# Patient Record
Sex: Male | Born: 1965 | Race: White | Hispanic: No | Smoking: Current every day smoker
Health system: Southern US, Community
[De-identification: ages and names within clinical notes are randomized; demographics above are authoritative.]

## PROBLEM LIST (undated history)

## (undated) DIAGNOSIS — R011 Cardiac murmur, unspecified: Secondary | ICD-10-CM

## (undated) DIAGNOSIS — F17200 Nicotine dependence, unspecified, uncomplicated: Secondary | ICD-10-CM

## (undated) DIAGNOSIS — E785 Hyperlipidemia, unspecified: Secondary | ICD-10-CM

## (undated) DIAGNOSIS — I10 Essential (primary) hypertension: Secondary | ICD-10-CM

## (undated) DIAGNOSIS — R079 Chest pain, unspecified: Secondary | ICD-10-CM

## (undated) DIAGNOSIS — Z973 Presence of spectacles and contact lenses: Secondary | ICD-10-CM

## (undated) DIAGNOSIS — G4730 Sleep apnea, unspecified: Secondary | ICD-10-CM

## (undated) DIAGNOSIS — Z9289 Personal history of other medical treatment: Secondary | ICD-10-CM

## (undated) DIAGNOSIS — E119 Type 2 diabetes mellitus without complications: Secondary | ICD-10-CM

## (undated) DIAGNOSIS — I2510 Atherosclerotic heart disease of native coronary artery without angina pectoris: Secondary | ICD-10-CM

## (undated) DIAGNOSIS — Z86010 Personal history of colonic polyps: Secondary | ICD-10-CM

## (undated) HISTORY — DX: Type 2 diabetes mellitus without complications: E11.9

## (undated) HISTORY — DX: Cardiac murmur, unspecified: R01.1

## (undated) HISTORY — DX: Presence of spectacles and contact lenses: Z97.3

## (undated) HISTORY — DX: Hyperlipidemia, unspecified: E78.5

## (undated) HISTORY — DX: Sleep apnea, unspecified: G47.30

## (undated) HISTORY — DX: Nicotine dependence, unspecified, uncomplicated: F17.200

## (undated) HISTORY — DX: Chest pain, unspecified: R07.9

## (undated) HISTORY — DX: Essential (primary) hypertension: I10

## (undated) HISTORY — DX: Personal history of colonic polyps: Z86.010

## (undated) HISTORY — DX: Personal history of other medical treatment: Z92.89

---
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
## 1983-09-12 HISTORY — PX: KNEE ARTHROSCOPY: SUR90

## 1999-09-12 DIAGNOSIS — R079 Chest pain, unspecified: Secondary | ICD-10-CM

## 1999-09-12 HISTORY — DX: Chest pain, unspecified: R07.9

## 2000-01-10 ENCOUNTER — Ambulatory Visit (HOSPITAL_COMMUNITY): Admission: RE | Admit: 2000-01-10 | Discharge: 2000-01-10 | Payer: Self-pay | Admitting: Family Medicine

## 2000-01-12 ENCOUNTER — Ambulatory Visit (HOSPITAL_COMMUNITY): Admission: RE | Admit: 2000-01-12 | Discharge: 2000-01-12 | Payer: Self-pay | Admitting: Gastroenterology

## 2006-02-26 ENCOUNTER — Ambulatory Visit: Payer: Self-pay | Admitting: Family Medicine

## 2006-12-03 ENCOUNTER — Ambulatory Visit: Payer: Self-pay | Admitting: Family Medicine

## 2008-04-03 ENCOUNTER — Ambulatory Visit: Payer: Self-pay | Admitting: Family Medicine

## 2008-04-09 ENCOUNTER — Ambulatory Visit: Payer: Self-pay | Admitting: Family Medicine

## 2008-06-10 ENCOUNTER — Ambulatory Visit: Payer: Self-pay | Admitting: Family Medicine

## 2010-01-26 ENCOUNTER — Ambulatory Visit: Payer: Self-pay | Admitting: Family Medicine

## 2010-04-12 ENCOUNTER — Ambulatory Visit: Payer: Self-pay | Admitting: Family Medicine

## 2010-09-13 ENCOUNTER — Ambulatory Visit
Admission: RE | Admit: 2010-09-13 | Discharge: 2010-09-13 | Payer: Self-pay | Source: Home / Self Care | Attending: Family Medicine | Admitting: Family Medicine

## 2011-01-27 NOTE — Procedures (Signed)
[facility]  Patient:    Chris Skinner, Chris Skinner                       MRN: [id number] Proc. Date: 01/12/00 Adm. Date:  [id number] Disc. Date: [id number] Attending:  Rich Brave CC:         Ronnald Nian, M.D.                           Procedure Report  INDICATION:  [age]-year-old with one-week history of severe odynophagia symptoms, to the point where he has been unable to eat or take in adequate fluids and actually needed several liters of IV fluids the other day.  He has not been on recent antibiotics, not had any obvious thermal ingestion of caustic ingestion o account for his esophagitis, which was verified radiographically yesterday.  ENDOSCOPIST:  Florencia Reasons, M.D.  FINDINGS:  Severe ulcerative distal esophagitis of indeterminate etiology.  DESCRIPTION OF PROCEDURE:  The nature, purpose and risks or the procedure had been discussed with the patient and his wife and he provided written consent. Sedation was Fentanyl 100 mcg and Versed 10 mg IV, without arrhythmias or desaturation. The Olympus GIF-140 upper endoscope was passed under direct vision without undue difficulty.  The vocal cords and larynx looked entirely normal.  Although the proximal esophageal mucosa was normal (apart from a small nodule consistent with a squamous papilloma), the distal half of the esophagus had severe ulcerative changes, beginning in the upper esophagus, as small discrete ulcerations measuring a few millimeters across to what became essentially circumferential or large patchy ulcers in the more distal section of the esophagus.  All of these ulcerations were fairly superficial.  There was friability present.  There was  2-cm hiatal hernia but no ring or stricture, no evidence of varices or exudate r neoplasia.  The stomach was entered.  It contained no significant residual and ad normal mucosa.  Retroflexed viewing was unsuccessful due to the  patient not being able to keep the air in well, with fairly frequent retching.  The pylorus and duodenal bulb and proximal second duodenum looked normal.  Viral cultures as well as biopsies for H and E were obtained prior to removal of the scope.  The patient tolerated the procedure well and there were no apparent  complications.  IMPRESSION:  Severe distal ulcerative esophagitis.  DISCUSSION:  I wonder about a viral esophagitis, given the multiple discrete lesions and the absence of typical candidal exudate.  PLAN:  Await pathology on biopsies.  Initiate Carafate suspension.  Consider empiric therapy with Acyclovir. DD:  01/12/00 TD:  01/16/00 Job: [id number]

## 2011-02-20 ENCOUNTER — Other Ambulatory Visit: Payer: Self-pay

## 2011-02-20 MED ORDER — ROSUVASTATIN CALCIUM 20 MG PO TABS: 20.0000 mg | ORAL_TABLET | Freq: Every day | ORAL | Status: DC

## 2011-02-28 ENCOUNTER — Encounter: Payer: Self-pay | Admitting: Medical

## 2011-02-28 ENCOUNTER — Ambulatory Visit (INDEPENDENT_AMBULATORY_CARE_PROVIDER_SITE_OTHER): Payer: BC Managed Care – PPO | Admitting: Medical

## 2011-02-28 DIAGNOSIS — Z0000 Encounter for general adult medical examination without abnormal findings: Secondary | ICD-10-CM

## 2011-02-28 DIAGNOSIS — K429 Umbilical hernia without obstruction or gangrene: Secondary | ICD-10-CM

## 2011-02-28 DIAGNOSIS — Z0100 Encounter for examination of eyes and vision without abnormal findings: Secondary | ICD-10-CM

## 2011-02-28 DIAGNOSIS — Z0110 Encounter for examination of ears and hearing without abnormal findings: Secondary | ICD-10-CM

## 2011-02-28 DIAGNOSIS — E785 Hyperlipidemia, unspecified: Secondary | ICD-10-CM

## 2011-02-28 DIAGNOSIS — I10 Essential (primary) hypertension: Secondary | ICD-10-CM

## 2011-02-28 DIAGNOSIS — Z139 Encounter for screening, unspecified: Secondary | ICD-10-CM

## 2011-02-28 DIAGNOSIS — G4730 Sleep apnea, unspecified: Secondary | ICD-10-CM

## 2011-02-28 DIAGNOSIS — K029 Dental caries, unspecified: Secondary | ICD-10-CM

## 2011-02-28 LAB — POCT URINALYSIS DIPSTICK
Ketones, UA: NEGATIVE
Spec Grav, UA: 1.015
Urobilinogen, UA: NEGATIVE
pH, UA: 6

## 2011-02-28 MED ORDER — ROSUVASTATIN CALCIUM 20 MG PO TABS: 20.0000 mg | ORAL_TABLET | Freq: Every day | ORAL | Status: DC

## 2011-02-28 NOTE — Progress Notes (Signed)
Subjective:   HPI  Chris Skinner is a 45 y.o. male who presents for complete physical.   Has has several concerns today.  Has concern for possible umbilical hernia. He notices a bulge around his umbilicus. This has been there for a few months.  Has concern for sleep apnea. His girlfriend notes that he snores a lot, and does stop breathing in his sleep at times. She has seen up to a minute of apnea. Has concern for hiccups when he eats salads.  He denies trouble swallowing in general, food does not get stuck, denies reflux.  Otherwise he tries to eat relatively healthy, exercises somewhat regularly.  Past Medical History  Diagnosis Date  . Dyslipidemia   . Tobacco use disorder    Past Surgical History  Procedure Date  . Knee surgery     left, arthroscopic   No Known Allergies  Reviewed medications, allergies, medical, surgical, family, social history.  Review of Systems Constitutional: denies fever, chills, sweats, unexpected weight change, anorexia, fatigue Allergy: negative; denies recent sneezing, itching, congestion Dermatology: denies changing moles, rash, lumps, new worrisome lesions ENT: no runny nose, ear pain, sore throat, hoarseness, sinus pain, teeth pain, tinnitus, hearing loss, epistaxis Cardiology: denies chest pain, palpitations, edema, orthopnea, paroxysmal nocturnal dyspnea Respiratory: denies cough, shortness of breath, dyspnea on exertion, wheezing, hemoptysis Gastroenterology: denies abdominal pain, nausea, vomiting, diarrhea, constipation, blood in stool, changes in bowel movement, dysphagia Hematology: denies bleeding or bruising problems Musculoskeletal: denies arthralgias, myalgias, joint swelling, back pain, neck pain, cramping, gait changes Ophthalmology: denies vision changes, eye redness, itching, discharge Urology: denies dysuria, difficulty urinating, hematuria, urinary frequency, urgency, incontinence Neurology: no headache, weakness, tingling, numbness,  speech abnormality, memory loss, falls, dizziness Psychology: denies depressed mood, agitation, sleep problems     Objective:   Physical Exam  General appearance: alert, no distress, WD/WN, white male Skin: Scattered flat macules of various sizes on torso anterior and posterior, legs and arms, most are benign appearing, but one particular lesion of left chest is 2 mm x 3 mm, mostly brown with some light brown clearing, border is well-defined, somewhat diamond-shaped lesion. HEENT: normocephalic, conjunctiva/corneas normal, sclerae anicteric, PERRLA, EOMi, nares patent, no discharge or erythema, pharynx normal Oral cavity: MMM, tongue normal, several teeth with caries, one left upper molar is fractured  Neck: supple, no lymphadenopathy, no thyromegaly, no masses, normal ROM, no JVD, no bruit Chest: non tender, normal shape and expansion Heart: RRR, normal S1, S2, no murmurs Lungs: CTA bilaterally, no wheezes, rhonchi, or rales Abdomen: +bs, 1.5 cm reducible umbilicus hernia, soft, non tender, non distended, no masses, no hepatomegaly, no splenomegaly, no bruits Back: non tender, normal ROM, no scoliosis Musculoskeletal: upper extremities non tender, no obvious deformity, normal ROM throughout, lower extremities non tender, no obvious deformity, normal ROM throughout Extremities: no edema, no cyanosis, no clubbing Pulses: 2+ symmetric, upper and lower extremities, normal cap refill Neurological: alert, oriented x 3, CN2-12 intact, strength normal upper extremities and lower extremities, sensation normal throughout, DTRs 2+ throughout, no cerebellar signs, gait normal Psychiatric: normal affect, behavior normal, pleasant  GU: Nontender, no masses, no lymphadenopathy, no lesions, no hernia Rectal: anus normal appearing, no obvious hernia, prostate WNL, occult negative stool    Assessment :    Encounter Diagnoses  Name Primary?  . General medical examination Yes  . Screening for unspecified  condition   . Examination of eyes and vision   . Examination of ears and hearing   . Unspecified essential hypertension   .  Hyperlipidemia   . Sleep apnea   . Umbilical hernia   . Dental caries      Plan:    Discussed healthy lifestyle, diet, exercise, skin surveillance, preventative care. We will get titers for proof of immunization per the employment form. PPD placed today, and he'll return in 48 hours for recheck. He is up-to-date on tetanus booster. He will get hep B series through work.  We will recheck your moles in 6 months.  Otherwise, consider dermatology referral for surveillance.  We will call with lab results in 3-4 days.  We will use a watch and wait approach on the hiccups and umbilical hernia.  For now, chew food thoroughly, drink plenty of water.   Advise he stop smoking, discussed risk of tobacco use.  Hypertension-new diagnosis today. Looking back, his blood pressure has been elevated on multiple occasions.  He also has symptoms suggestive of sleep apnea. We discussed getting a sleep study which he wants to proceed with, however he will call back regarding appointment time. We will hold off on medication until we get sleep study results.  Advised he follow up with dentist regarding dental caries.  Follow up with your eye doctor for routine screening.

## 2011-02-28 NOTE — Patient Instructions (Signed)
Your blood pressure is elevated.  Call us back and let us know if you want the overnight at home study versus the official sleep study.  We will hold off on medication until we get sleep study results.  Follow up with your dentist regarding dental caries.  Follow up with your eye doctor for routine screening.  Return in 48-72 hours for PPD skin test reading.  Lets recheck your moles in 6 months.  We will call with lab results in 3-4 days.    We will use a watch and wait approach on the hiccups and umbilical hernia.  For now, chew food thoroughly, drink plenty of water.   Preventative Care for Adults, Male       REGULAR HEALTH EXAMS:  A routine yearly physical is a good way to check in with your primary care provider about your health and preventive screening. It is also an opportunity to share updates about your health and any concerns you have, and receive a thorough all-over exam.   Most health insurance companies pay for at least some preventative services.  Check with your health plan for specific coverage.  WHAT PREVENTATIVE SERVICES DO MEN NEED?  Adult men should have their weight and blood pressure checked regularly.   Men age 89 and older should have their cholesterol levels checked regularly.  Beginning at age 26 and continuing to age 71, men should be screened for colorectal cancer.  Certain people should may need continued testing until age 28.  Other cancer screening may include exams for testicular and prostate cancer.  Updating vaccinations is part of preventative care.  Vaccinations help protect against diseases such as the flu.  Lab tests are generally done as part of preventative care to screen for anemia and blood disorders, to screen for problems with the kidneys and liver, to screen for bladder problems, to check blood sugar, and to check your cholesterol level.  Preventative services generally include counseling about diet, exercise, avoiding tobacco, drugs,  excessive alcohol consumption, and sexually transmitted infections.    GENERAL RECOMMENDATIONS FOR GOOD HEALTH:  Healthy diet:  Eat a variety of foods, including fruit, vegetables, animal or vegetable protein, such as meat, fish, chicken, and eggs, or beans, lentils, tofu, and grains, such as rice.  Drink plenty of water daily.  Decrease saturated fat in the diet, avoid lots of red meat, processed foods, sweets, fast foods, and fried foods.  Exercise:  Aerobic exercise helps maintain good heart health. At least 30-40 minutes of moderate-intensity exercise is recommended. For example, a brisk walk that increases your heart rate and breathing. This should be done on most days of the week.   Find a type of exercise or a variety of exercises that you enjoy so that it becomes a part of your daily life.  Examples are running, walking, swimming, water aerobics, and biking.  For motivation and support, explore group exercise such as aerobic class, spin class, Zumba, Yoga,or  martial arts, etc.    Set exercise goals for yourself, such as a certain weight goal, walk or run in a race such as a 5k walk/run.  Speak to your primary care provider about exercise goals.  Disease prevention:  If you smoke or chew tobacco, find out from your caregiver how to quit. It can literally save your life, no matter how long you have been a tobacco user. If you do not use tobacco, never begin.   Maintain a healthy diet and normal weight. Increased weight leads  to problems with blood pressure and diabetes.   The Body Mass Index or BMI is a way of measuring how much of your body is fat. Having a BMI above 27 increases the risk of heart disease, diabetes, hypertension, stroke and other problems related to obesity. Your caregiver can help determine your BMI and based on it develop an exercise and dietary program to help you achieve or maintain this important measurement at a healthful level.  High blood pressure causes  heart and blood vessel problems.  Persistent high blood pressure should be treated with medicine if weight loss and exercise do not work.   Fat and cholesterol leaves deposits in your arteries that can block them. This causes heart disease and vessel disease elsewhere in your body.  If your cholesterol is found to be high, or if you have heart disease or certain other medical conditions, then you may need to have your cholesterol monitored frequently and be treated with medication.   Ask if you should have a stress test if your history suggests this. A stress test is a test done on a treadmill that looks for heart disease. This test can find disease prior to there being a problem.  Avoid drinking alcohol in excess (more than two drinks per day).  Avoid use of street drugs. Do not share needles with anyone. Ask for professional help if you need assistance or instructions on stopping the use of alcohol, cigarettes, and/or drugs.  Brush your teeth twice a day with fluoride toothpaste, and floss once a day. Good oral hygiene prevents tooth decay and gum disease. The problems can be painful, unattractive, and can cause other health problems. Visit your dentist for a routine oral and dental check up and preventive care every 6-12 months.   Look at your skin regularly.  Use a mirror to look at your back. Notify your caregivers of changes in moles, especially if there are changes in shapes, colors, a size larger than a pencil eraser, an irregular border, or development of new moles.  Safety:  Use seatbelts 100% of the time, whether driving or as a passenger.  Use safety devices such as hearing protection if you work in environments with loud noise or significant background noise.  Use safety glasses when doing any work that could send debris in to the eyes.  Use a helmet if you ride a bike or motorcycle.  Use appropriate safety gear for contact sports.  Talk to your caregiver about gun safety.  Use sunscreen  with a SPF (or skin protection factor) of 15 or greater.  Lighter skinned people are at a greater risk of skin cancer. Don't forget to also wear sunglasses in order to protect your eyes from too much damaging sunlight. Damaging sunlight can accelerate cataract formation.   Practice safe sex. Use condoms. Condoms are used for birth control and to help reduce the spread of sexually transmitted infections (or STIs).  Some of the STIs are gonorrhea (the clap), chlamydia, syphilis, trichomonas, herpes, HPV (human papilloma virus) and HIV (human immunodeficiency virus) which causes AIDS. The herpes, HIV and HPV are viral illnesses that have no cure. These can result in disability, cancer and death.   Keep carbon monoxide and smoke detectors in your home functioning at all times. Change the batteries every 6 months or use a model that plugs into the wall.   Vaccinations:  Stay up to date with your tetanus shots and other required immunizations. You should have a booster for tetanus  every 10 years. Be sure to get your flu shot every year, since 5%-20% of the [place] population comes down with the flu. The flu vaccine changes each year, so being vaccinated once is not enough. Get your shot in the fall, before the flu season peaks.   Other vaccines to consider:  Pneumococcal vaccine to protect against certain types of pneumonia.  This is normally recommended for adults age 72 or older.  However, adults younger than 47 years old with certain underlying conditions such as diabetes, heart or lung disease should also receive the vaccine.  Shingles vaccine to protect against Varicella Zoster if you are older than age 52, or younger than 47 years old with certain underlying illness.  Hepatitis A vaccine to protect against a form of infection of the liver by a virus acquired from food.  Hepatitis B vaccine to protect against a form of infection of the liver by a virus acquired from blood or body fluids, particularly if  you work in health care.  If you plan to travel internationally, check with your local health department for specific vaccination recommendations.  Cancer Screening:  Most routine colon cancer screening begins at the age of 6. On a yearly basis, doctors may provide special easy to use take-home tests to check for hidden blood in the stool. Sigmoidoscopy or colonoscopy can detect the earliest forms of colon cancer and is life saving. These tests use a small camera at the end of a tube to directly examine the colon. Speak to your caregiver about this at age 65, when routine screening begins (and is repeated every 5 years unless early forms of pre-cancerous polyps or small growths are found).   At the age of 44 men usually start screening for prostate cancer every year. Screening may begin at a younger age for those with higher risk. Those at higher risk include African-Americans or having a family history of prostate cancer. There are two types of tests for prostate cancer:   Prostate-specific antigen (PSA) testing. Recent studies raise questions about prostate cancer using PSA and you should discuss this with your caregiver.   Digital rectal exam (in which your doctor's lubricated and gloved finger feels for enlargement of the prostate through the anus).   Screening for testicular cancer.  Do a monthly exam of your testicles. Gently roll each testicle between your thumb and fingers, feeling for any abnormal lumps. The best time to do this is after a hot shower or bath when the tissues are looser. Notify your caregivers of any lumps, tenderness or changes in size or shape immediately.

## 2011-03-01 LAB — CBC WITH DIFFERENTIAL/PLATELET
Hemoglobin: 15.1 g/dL (ref 13.0–17.0)
Lymphocytes Relative: 26 % (ref 12–46)
Lymphs Abs: 2.6 10*3/uL (ref 0.7–4.0)
MCH: 32 pg (ref 26.0–34.0)
Monocytes Relative: 8 % (ref 3–12)
Neutro Abs: 6.3 10*3/uL (ref 1.7–7.7)
Neutrophils Relative %: 62 % (ref 43–77)
Platelets: 309 10*3/uL (ref 150–400)
RBC: 4.72 MIL/uL (ref 4.22–5.81)
WBC: 10.2 10*3/uL (ref 4.0–10.5)

## 2011-03-01 LAB — LIPID PANEL
Cholesterol: 207 mg/dL — ABNORMAL HIGH (ref 0–200)
HDL: 48 mg/dL (ref >39)
Total CHOL/HDL Ratio: 4.3 Ratio
Triglycerides: 115 mg/dL (ref <150)
VLDL: 23 mg/dL (ref 0–40)

## 2011-03-01 LAB — COMPREHENSIVE METABOLIC PANEL
ALT: 36 U/L (ref 0–53)
AST: 25 U/L (ref 0–37)
Chloride: 101 mEq/L (ref 96–112)
Creat: 0.85 mg/dL (ref 0.50–1.35)
Sodium: 135 mEq/L (ref 135–145)
Total Bilirubin: 0.8 mg/dL (ref 0.3–1.2)
Total Protein: 6.7 g/dL (ref 6.0–8.3)

## 2011-03-06 NOTE — Progress Notes (Signed)
Tried calling pt regarding lab results, cannot leave message on v/m to full and work number disconnected.  Will try again later.  CM, LPN

## 2011-03-07 ENCOUNTER — Telehealth: Payer: Self-pay | Admitting: *Deleted

## 2011-03-07 LAB — TB SKIN TEST: TB Skin Test: NEGATIVE mm

## 2011-03-07 NOTE — Telephone Encounter (Signed)
i don't see documentation or office visit note about negative PPD?  Pls help me find this.  I still have his form that will be completed once he gets the MMR.

## 2011-03-07 NOTE — Telephone Encounter (Addendum)
Message copied by Dorthula Perfect on Tue Mar 07, 2011  3:47 PM ------      Message from: Aleen Campi, Kermit Balo      Created: Fri Mar 03, 2011  6:11 PM       Call pt about the results:  He is not immune to one of the MMR components, thus, have him return for the MMR booster.  Did he come in for PPD skin reading?  His thyroid labs were normal.  See rest of msg below.   Called pt with results.  Pt will come in for MMR Booster on 03-09-11 at 2 pm.  Pt did come in for PPD reading which was negative.  CM LPN

## 2011-03-09 ENCOUNTER — Other Ambulatory Visit: Payer: BC Managed Care – PPO

## 2011-03-10 ENCOUNTER — Other Ambulatory Visit (INDEPENDENT_AMBULATORY_CARE_PROVIDER_SITE_OTHER): Payer: BC Managed Care – PPO

## 2011-03-10 DIAGNOSIS — Z23 Encounter for immunization: Secondary | ICD-10-CM

## 2011-03-13 ENCOUNTER — Encounter: Payer: Self-pay | Admitting: Family Medicine

## 2011-03-13 ENCOUNTER — Encounter: Payer: Self-pay | Admitting: Medical

## 2011-03-13 NOTE — Progress Notes (Signed)
  Subjective:    Patient ID: Chris Skinner, male    DOB: Oct 06, 1965, 45 y.o.   MRN: [id number]  HPI    Review of Systems     Objective:   Physical Exam        Assessment & Plan:

## 2011-03-20 ENCOUNTER — Other Ambulatory Visit: Payer: Self-pay | Admitting: Family Medicine

## 2011-07-17 ENCOUNTER — Telehealth: Payer: Self-pay | Admitting: Family Medicine

## 2011-07-19 NOTE — Telephone Encounter (Signed)
JCL HANDLED-LM

## 2011-09-09 ENCOUNTER — Other Ambulatory Visit: Payer: Self-pay | Admitting: Family Medicine

## 2012-02-26 ENCOUNTER — Other Ambulatory Visit: Payer: Self-pay | Admitting: Family Medicine

## 2012-03-15 ENCOUNTER — Other Ambulatory Visit: Payer: Self-pay | Admitting: Family Medicine

## 2012-03-15 NOTE — Telephone Encounter (Signed)
PATIENT NEEDS TO SCHEDULE AN OFFICE VISIT BEFORE HIS MEDICATION RUNS OUT 

## 2012-03-25 ENCOUNTER — Encounter: Payer: BC Managed Care – PPO | Admitting: Medical

## 2012-04-15 ENCOUNTER — Other Ambulatory Visit: Payer: Self-pay | Admitting: Family Medicine

## 2012-04-16 ENCOUNTER — Encounter: Payer: Self-pay | Admitting: Medical

## 2012-04-16 ENCOUNTER — Ambulatory Visit (INDEPENDENT_AMBULATORY_CARE_PROVIDER_SITE_OTHER): Payer: BC Managed Care – PPO | Admitting: Medical

## 2012-04-16 VITALS — BP 120/90 | HR 60 | Temp 98.1°F | Resp 16 | Ht 73.0 in | Wt 226.0 lb

## 2012-04-16 DIAGNOSIS — K429 Umbilical hernia without obstruction or gangrene: Secondary | ICD-10-CM

## 2012-04-16 DIAGNOSIS — Z23 Encounter for immunization: Secondary | ICD-10-CM

## 2012-04-16 DIAGNOSIS — D229 Melanocytic nevi, unspecified: Secondary | ICD-10-CM

## 2012-04-16 DIAGNOSIS — Z720 Tobacco use: Secondary | ICD-10-CM

## 2012-04-16 DIAGNOSIS — Z0000 Encounter for general adult medical examination without abnormal findings: Secondary | ICD-10-CM

## 2012-04-16 DIAGNOSIS — D239 Other benign neoplasm of skin, unspecified: Secondary | ICD-10-CM

## 2012-04-16 DIAGNOSIS — I10 Essential (primary) hypertension: Secondary | ICD-10-CM

## 2012-04-16 DIAGNOSIS — E785 Hyperlipidemia, unspecified: Secondary | ICD-10-CM

## 2012-04-16 DIAGNOSIS — F17200 Nicotine dependence, unspecified, uncomplicated: Secondary | ICD-10-CM

## 2012-04-16 LAB — CBC
HCT: 41.7 % (ref 39.0–52.0)
Hemoglobin: 15 g/dL (ref 13.0–17.0)
MCH: 31.6 pg (ref 26.0–34.0)
MCV: 87.8 fL (ref 78.0–100.0)
RBC: 4.75 MIL/uL (ref 4.22–5.81)

## 2012-04-16 LAB — COMPREHENSIVE METABOLIC PANEL
Alkaline Phosphatase: 41 U/L (ref 39–117)
BUN: 13 mg/dL (ref 6–23)
CO2: 23 mEq/L (ref 19–32)
Creat: 0.69 mg/dL (ref 0.50–1.35)
Glucose, Bld: 79 mg/dL (ref 70–99)
Sodium: 139 mEq/L (ref 135–145)
Total Bilirubin: 0.8 mg/dL (ref 0.3–1.2)
Total Protein: 6.7 g/dL (ref 6.0–8.3)

## 2012-04-16 LAB — LIPID PANEL
HDL: 44 mg/dL (ref >39)
LDL Cholesterol: 138 mg/dL — ABNORMAL HIGH (ref 0–99)
Total CHOL/HDL Ratio: 4.6 Ratio
VLDL: 21 mg/dL (ref 0–40)

## 2012-04-16 MED ORDER — ROSUVASTATIN CALCIUM 20 MG PO TABS: 20.0000 mg | ORAL_TABLET | Freq: Every day | ORAL | Status: DC

## 2012-04-16 MED ORDER — LISINOPRIL 5 MG PO TABS: 5.0000 mg | ORAL_TABLET | Freq: Every day | ORAL | Status: DC

## 2012-04-16 NOTE — Patient Instructions (Signed)
Hypertension - begin Lisinopril 5mg  daily for blood pressure.  Hyperlipidemia - continue Crestor.  Refills sent today.  Begin a daily 81mg  baby aspirin for heart health.  Take a daily multivitamin.  Continue routine exercise and healthy low fat low cholesterol diet.   We updated your hepatitis B vaccine today.  Recheck in 2 months regarding Hepatitis B #2 and recheck on hypertension and new BP medication.  We will refer you to dermatology for skin surveillance.    See your eye doctor yearly, dentist every 6 months.    Try to quit tobacco completely.   Consider umbilical hernia repair in the spring.   YOU CAN QUIT SMOKING!  Talk to your medical provider about using medicines to help you quit. These include nicotine replacement gum, lozenges, or skin patches.  Consider calling 1-800-QUIT-NOW, a toll free 24/7 hotline with free counseling to help you quit.  If you are ready to quit smoking or are thinking about it, congratulations! You have chosen to help yourself be healthier and live longer! There are lots of different ways to quit smoking. Nicotine gum, nicotine patches, a nicotine inhaler, or nicotine nasal spray can help with physical craving. Hypnosis, support groups, and medicines help break the habit of smoking. TIPS TO GET OFF AND STAY OFF CIGARETTES  Learn to predict your moods. Do not let a bad situation be your excuse to have a cigarette. Some situations in your life might tempt you to have a cigarette.   Ask friends and co-workers not to smoke around you.   Make your home smoke-free.   Never have "just one" cigarette. It leads to wanting another and another. Remind yourself of your decision to quit.   On a card, make a list of your reasons for not smoking. Read it at least the same number of times a day as you have a cigarette. Tell yourself everyday, "I do not want to smoke. I choose not to smoke."   Ask someone at home or work to help you with your plan to quit smoking.     Have something planned after you eat or have a cup of coffee. Take a walk or get other exercise to perk you up. This will help to keep you from overeating.   Try a relaxation exercise to calm you down and decrease your stress. Remember, you may be tense and nervous the first two weeks after you quit. This will pass.   Find new activities to keep your hands busy. Play with a pen, coin, or rubber band. Doodle or draw things on paper.   Brush your teeth right after eating. This will help cut down the craving for the taste of tobacco after meals. You can try mouthwash too.   Try gum, breath mints, or diet candy to keep something in your mouth.  IF YOU SMOKE AND WANT TO QUIT:  Do not stock up on cigarettes. Never buy a carton. Wait until one pack is finished before you buy another.   Never carry cigarettes with you at work or at home.   Keep cigarettes as far away from you as possible. Leave them with someone else.   Never carry matches or a lighter with you.   Ask yourself, "Do I need this cigarette or is this just a reflex?"   Bet with someone that you can quit. Put cigarette money in a piggy bank every morning. If you smoke, you give up the money. If you do not smoke, by the end  of the week, you keep the money.   Keep trying. It takes 21 days to change a habit!  Document Released: 06/24/2009 Document Revised: 05/10/2011 Document Reviewed: 06/24/2009 [facility] Patient Information 2012 [place], [place].

## 2012-04-16 NOTE — Progress Notes (Signed)
Subjective:   HPI  Chris Skinner is a 46 y.o. male who presents for complete physical.   Last visit a little over a year ago.  Has been doing well in general.  Has cut down even more on tobacco, is exercising more.  He is compliant with his cholesterol medication.  He never called back last year regarding a sleep study.  He denies any recent issues snoring or apnea events as reported last year.  He wants me to recheck moles and belly button hernia today.  No other new c/o.  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     No Known Allergies  Current Outpatient Prescriptions on File Prior to Visit  Medication Sig Dispense Refill  . CRESTOR 20 MG tablet TAKE 1 TABLET (20 MG TOTAL) BY MOUTH AT BEDTIME.  30 tablet  0  . rosuvastatin (CRESTOR) 20 MG tablet Take 1 tablet (20 mg total) by mouth at bedtime.  30 tablet  11  . DISCONTD: CRESTOR 20 MG tablet TAKE 1 TABLET (20 MG TOTAL) BY MOUTH AT BEDTIME.  30 tablet  5    Past Medical History  Diagnosis Date  . Dyslipidemia   . Tobacco use disorder   . Chest pain 2001    [facility], normal cardiac workup    Past Surgical History  Procedure Date  . Knee surgery     left, arthroscopic    Family History  Problem Relation Age of Onset  . Hyperlipidemia Mother   . Heart disease Mother   . Arthritis Mother   . Stroke Mother   . Heart disease Father   . Diabetes Father     borderline  . Crohn's disease Sister   . Diabetes Paternal Grandfather   . Cancer Neg Hx     History   Social History  . Marital Status: Married    Spouse Name: N/A    Number of Children: N/A  . Years of Education: N/A   Occupational History  . high school PE teacher     [employer]   Social History Main Topics  . Smoking status: Current Everyday Smoker -- 0.5 packs/day for 25 years    Types: Cigarettes  . Smokeless tobacco: Never Used  . Alcohol Use: 6.0 oz/week    12 drink(s) per week  . Drug Use: No  . 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 Not on file   Social History Narrative   Exercise - calisthenics, walking in afternoons 3-5 days per week, single, 2 children    Reviewed their medical, surgical, family, social, medication, and allergy history and updated chart as appropriate.  Review of Systems Constitutional: -fever, -chills, -sweats, -unexpected weight change, -anorexia, -fatigue Allergy: -sneezing, -itching, -congestion Dermatology: + changing moles, rash, lumps, new worrisome lesions ENT: -runny nose, -ear pain, -sore throat, -hoarseness, -sinus pain, -teeth pain, -tinnitus, -hearing loss, -epistaxis Cardiology:  -chest pain, -palpitations, -edema, -orthopnea, -paroxysmal nocturnal dyspnea Respiratory: -cough, -shortness of breath, -dyspnea on exertion, -wheezing, -hemoptysis Gastroenterology: -abdominal pain, -nausea, -vomiting, -diarrhea, -constipation, -blood in stool, -changes in bowel movement, -dysphagia Hematology: -bleeding or bruising problems Musculoskeletal: -arthralgias, -myalgias, -joint swelling, -back pain, -neck pain, -cramping, -gait changes Ophthalmology: -vision changes, -eye redness, -itching, -discharge Urology: -dysuria, -difficulty urinating, -hematuria, -urinary frequency, -urgency, incontinence Neurology: -headache, -weakness, -tingling, -numbness, -speech abnormality, -  memory loss, -falls, -dizziness Psychology:  -depressed mood, -agitation, -sleep problems    Objective:   Physical Exam  General appearance: alert, no distress, WD/WN, white male Skin: Scattered flat macules of various sizes on torso anterior and posterior, legs and arms, most are benign appearing, but a few multicolored, left chest/breast 7 o'clock, left and right lower abdomen HEENT: normocephalic, conjunctiva/corneas normal, sclerae anicteric, PERRLA, EOMi, nares patent, no discharge  or erythema, pharynx normal Oral cavity: MMM, tongue normal, teeth in good repair Neck: supple, no lymphadenopathy, no thyromegaly, no masses, normal ROM, no JVD, no bruit Chest: non tender, normal shape and expansion Heart: RRR, very faint possible I/VI brief systolic murmur heard in left lower sternal border, mitral region, otherwise normal S1, S2 Lungs: CTA bilaterally, no wheezes, rhonchi, or rales Abdomen: +bs, 1.5 cm reducible umbilicus hernia, soft, non tender, non distended, no masses, no hepatomegaly, no splenomegaly, no bruits Back: non tender, normal ROM, no scoliosis Musculoskeletal: upper extremities non tender, no obvious deformity, normal ROM throughout, lower extremities non tender, no obvious deformity, normal ROM throughout Extremities: no edema, no cyanosis, no clubbing Pulses: 2+ symmetric, upper and lower extremities, normal cap refill Neurological: alert, oriented x 3, CN2-12 intact, strength normal upper extremities and lower extremities, sensation normal throughout, DTRs 2+ throughout, no cerebellar signs, gait normal Psychiatric: normal affect, behavior normal, pleasant  GU: Nontender, no masses, no lymphadenopathy, no lesions, no hernia Rectal: deferred   Assessment :    Encounter Diagnoses  Name Primary?  . Routine general medical examination at a health care facility Yes  . Hyperlipidemia   . Essential hypertension, benign   . Atypical mole   . Tobacco use   . Umbilical hernia   . Need for hepatitis B vaccination      Plan:    Physical - Discussed healthy lifestyle, diet, exercise, skin surveillance, preventative care. Completed his school physical form for employment.   Hyperlipidemia - c/t crestor, labs today  Hypertension - begin Lisinopril 5mg  daily, c/t regular exercise, healthy diet, recheck 60mo  Atypical moles - dermatology referral for surveillance  Advise he stop smoking, discussed risk of tobacco use.  Umbilical hernia - no worse than a  year ago.  Consider repair in the spring after football season  Hep B vaccine begun today.  Return in 60mo for Hep B #2.  Vaccine counseling and VIS given today.

## 2012-04-16 NOTE — Addendum Note (Signed)
Addended by: Janeice Robinson on: 04/16/2012 03:35 PM   Modules accepted: Orders

## 2012-04-17 ENCOUNTER — Telehealth: Payer: Self-pay | Admitting: Family Medicine

## 2012-04-17 NOTE — Telephone Encounter (Signed)
I documented the vaccine. CLS

## 2012-04-17 NOTE — Telephone Encounter (Signed)
Message copied by Janeice Robinson on Wed Apr 17, 2012 11:23 AM ------      Message from: Aleen Campi, DAVID S      Created: Wed Apr 17, 2012  9:42 AM       Document the Hep B vaccine

## 2012-04-23 ENCOUNTER — Telehealth: Payer: Self-pay | Admitting: Family Medicine

## 2012-04-23 NOTE — Telephone Encounter (Signed)
PATIENT IS AWARE OF HIS APPOINTMENT WITH [facility] ON 05/16/12 @ 1015 AM WITH DEANE ANDERSON PA-C.CLS FAX OVER OV NOTES TO THE OFFICE THROUGH [facility]. CLS  [facility] [id number]

## 2012-09-12 ENCOUNTER — Other Ambulatory Visit: Payer: Self-pay | Admitting: Medical

## 2012-10-05 ENCOUNTER — Other Ambulatory Visit: Payer: Self-pay | Admitting: Family Medicine

## 2013-03-27 ENCOUNTER — Other Ambulatory Visit: Payer: Self-pay | Admitting: Medical

## 2013-05-24 ENCOUNTER — Other Ambulatory Visit: Payer: Self-pay | Admitting: Family Medicine

## 2013-05-27 ENCOUNTER — Telehealth: Payer: Self-pay | Admitting: Family Medicine

## 2013-05-27 MED ORDER — ROSUVASTATIN CALCIUM 20 MG PO TABS: 20.0000 mg | ORAL_TABLET | Freq: Every day | ORAL | Status: DC

## 2013-05-27 NOTE — Telephone Encounter (Signed)
done

## 2013-06-09 ENCOUNTER — Other Ambulatory Visit: Payer: Self-pay | Admitting: Medical

## 2013-09-09 ENCOUNTER — Ambulatory Visit (INDEPENDENT_AMBULATORY_CARE_PROVIDER_SITE_OTHER): Payer: BC Managed Care – PPO | Admitting: Medical

## 2013-09-09 ENCOUNTER — Encounter: Payer: Self-pay | Admitting: Medical

## 2013-09-09 VITALS — BP 150/90 | HR 68 | Temp 97.6°F | Ht 73.0 in | Wt 222.0 lb

## 2013-09-09 DIAGNOSIS — Z1211 Encounter for screening for malignant neoplasm of colon: Secondary | ICD-10-CM

## 2013-09-09 DIAGNOSIS — R011 Cardiac murmur, unspecified: Secondary | ICD-10-CM

## 2013-09-09 DIAGNOSIS — Z23 Encounter for immunization: Secondary | ICD-10-CM

## 2013-09-09 DIAGNOSIS — Z125 Encounter for screening for malignant neoplasm of prostate: Secondary | ICD-10-CM

## 2013-09-09 DIAGNOSIS — E785 Hyperlipidemia, unspecified: Secondary | ICD-10-CM

## 2013-09-09 DIAGNOSIS — Z0000 Encounter for general adult medical examination without abnormal findings: Secondary | ICD-10-CM

## 2013-09-09 DIAGNOSIS — F17200 Nicotine dependence, unspecified, uncomplicated: Secondary | ICD-10-CM

## 2013-09-09 DIAGNOSIS — I10 Essential (primary) hypertension: Secondary | ICD-10-CM

## 2013-09-09 LAB — COMPREHENSIVE METABOLIC PANEL
ALT: 41 U/L (ref 0–53)
AST: 27 U/L (ref 0–37)
Albumin: 4.5 g/dL (ref 3.5–5.2)
CO2: 26 mEq/L (ref 19–32)
Calcium: 9.2 mg/dL (ref 8.4–10.5)
Chloride: 103 mEq/L (ref 96–112)
Potassium: 4.4 mEq/L (ref 3.5–5.3)
Sodium: 137 mEq/L (ref 135–145)
Total Bilirubin: 0.9 mg/dL (ref 0.3–1.2)
Total Protein: 6.8 g/dL (ref 6.0–8.3)

## 2013-09-09 LAB — CBC
MCHC: 35.9 g/dL (ref 30.0–36.0)
Platelets: 308 10*3/uL (ref 150–400)
RBC: 5.18 MIL/uL (ref 4.22–5.81)
RDW: 13.4 % (ref 11.5–15.5)
WBC: 9.9 10*3/uL (ref 4.0–10.5)

## 2013-09-09 MED ORDER — LISINOPRIL 10 MG PO TABS: 10.0000 mg | ORAL_TABLET | Freq: Every day | ORAL | Status: DC

## 2013-09-09 MED ORDER — ROSUVASTATIN CALCIUM 20 MG PO TABS: 20.0000 mg | ORAL_TABLET | Freq: Every day | ORAL | Status: DC

## 2013-09-09 MED ORDER — ASPIRIN EC 81 MG PO TBEC: 81.0000 mg | DELAYED_RELEASE_TABLET | Freq: Every day | ORAL | Status: DC

## 2013-09-09 NOTE — Progress Notes (Signed)
Subjective:   HPI  Chris Skinner is a 47 y.o. male who presents for a complete physical.   Preventative care: Last ophthalmology visit:yes Dr. Sherryle Lis- 09/2013 Last dental visit:yes- DR. McCloud Last colonoscopy:n/a Last prostate exam: 2013 Last EKG:5/20211 Last labs:2013  Prior vaccinations: TD or Tdap:2009 Influenza:no flu vaccine Pneumococcal:n/a Shingles/Zostavax:n/a Other: no other doctors  Advanced directive:n/a Health care power of attorney:n/a Living will: n/a  Reviewed their medical, surgical, family, social, medication, and allergy history and updated chart as appropriate.  Past Medical History  Diagnosis Date  . Dyslipidemia   . Tobacco use disorder   . Chest pain 2001    [facility], normal cardiac workup  . Hypertension   . Wears glasses     Past Surgical History  Procedure Laterality Date  . Knee surgery      left, arthroscopic  . Tonsillectomy and adenoidectomy      History   Social History  . Marital Status: Married    Spouse Name: N/A    Number of Children: N/A  . Years of Education: N/A   Occupational History  . high school PE teacher     [employer]   Social History Main Topics  . Smoking status: Current Every Day Smoker -- 0.25 packs/day for 25 years    Types: Cigarettes  . Smokeless tobacco: Never Used  . Alcohol Use: 6.0 oz/week    10 Cans of beer per week  . Drug Use: No  . 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 Not on file   Social History Narrative   Exercise - calisthenics, walking in afternoons 3-5 days per week, single, 2 children    Family History  Problem Relation Age of Onset  . Hyperlipidemia Mother   . Heart disease Mother   . Arthritis Mother   . Stroke Mother   . Heart disease Father   . Diabetes Father     borderline  . Crohn's disease Sister   . Diabetes Paternal Grandfather   .  Cancer Neg Hx     Current outpatient prescriptions:aspirin EC 81 MG tablet, Take 1 tablet (81 mg total) by mouth daily., Disp: 90 tablet, Rfl: 3;  lisinopril (PRINIVIL,ZESTRIL) 10 MG tablet, Take 1 tablet (10 mg total) by mouth daily., Disp: 90 tablet, Rfl: 3;  rosuvastatin (CRESTOR) 20 MG tablet, Take 1 tablet (20 mg total) by mouth at bedtime., Disp: 30 tablet, Rfl: 11 rosuvastatin (CRESTOR) 20 MG tablet, Take 1 tablet (20 mg total) by mouth daily., Disp: 90 tablet, Rfl: 3  No Known Allergies   Review of Systems Constitutional: -fever, -chills, -sweats, -unexpected weight change, -decreased appetite, -fatigue Allergy: -sneezing, -itching, -congestion Dermatology: -changing moles, --rash, -lumps ENT: -runny nose, -ear pain, -sore throat, -hoarseness, -sinus pain, -teeth pain, - ringing in ears, -hearing loss, -nosebleeds Cardiology: -chest pain, -palpitations, -swelling, -difficulty breathing when lying flat, -waking up short of breath Respiratory: -cough, -shortness of breath, -difficulty breathing with exercise or exertion, -wheezing, -coughing up blood Gastroenterology: -abdominal pain, -nausea, -vomiting, -diarrhea, -constipation, -blood in stool, -changes in bowel movement, -difficulty swallowing or eating Hematology: -bleeding, -bruising  Musculoskeletal: +joint aches, -muscle aches, -joint swelling, -back pain, -neck pain, -cramping, -changes in gait Ophthalmology: denies vision changes, eye redness, itching, discharge Urology: -burning with urination, -difficulty urinating, -blood in urine, -urinary frequency, -urgency, -incontinence Neurology: -headache, -weakness, -tingling, -numbness, -memory  loss, -falls, -dizziness Psychology: -depressed mood, -agitation, -sleep problems     Objective:   Physical Exam  BP 150/90  Pulse 68  Temp(Src) 97.6 F (36.4 C) (Oral)  Ht 6\' 1"  (1.854 m)  Wt 222 lb (100.699 kg)  BMI 29.30 kg/m2  General appearance: alert, no distress, WD/WN,  white male  Skin: Scattered flat macules of various sizes on torso anterior and posterior, legs and arms, most are benign appearing, but a few multicolored, left chest/breast 7 o'clock, left and right lower abdomen, right medial upper arm with old linear scar HEENT: normocephalic, conjunctiva/corneas normal, sclerae anicteric, PERRLA, EOMi, nares patent, no discharge or erythema, pharynx normal  Oral cavity: MMM, tongue normal, teeth in good repair  Neck: supple, no lymphadenopathy, no thyromegaly, no masses, normal ROM, no JVD, no bruit  Chest: non tender, normal shape and expansion  Heart: RRR, II/VI systolic murmur heard in left lower sternal border and left lateral chest/ mitral region, otherwise normal S1, S2  Lungs: CTA bilaterally, no wheezes, rhonchi, or rales  Abdomen: +bs, 1.5 cm reducible umbilicus hernia, soft, non tender, non distended, no masses, no hepatomegaly, no splenomegaly, no bruits  Back: non tender, normal ROM, no scoliosis  Musculoskeletal: upper extremities non tender, no obvious deformity, normal ROM throughout, lower extremities non tender, no obvious deformity, normal ROM throughout  Extremities: no edema, no cyanosis, no clubbing  Pulses: 2+ symmetric, upper and lower extremities, normal cap refill  Neurological: alert, oriented x 3, CN2-12 intact, strength normal upper extremities and lower extremities, sensation normal throughout, DTRs 2+ throughout, no cerebellar signs, gait normal  Psychiatric: normal affect, behavior normal, pleasant  GU: Nontender, no masses, circumcised,no lymphadenopathy, no lesions, no hernia  Rectal: anus nontender, prostate WNL, no nodules, occult negative stool    Adult ECG Report  Indication: murmur, hx/o HTN  Rate: 57 bpm  Rhythm: sinus bradycardia  QRS Axis: 50 degrees  PR Interval:  QRS Duration: 96ms  QTc:  Conduction Disturbances: none  Other Abnormalities: none  Patient's cardiac risk factors are: dyslipidemia,  hypertension, male gender and smoking/ tobacco exposure.  EKG comparison: 2011   Narrative Interpretation: sinus bradycardia, no acute changes    Assessment and Plan :      Encounter Diagnoses  Name Primary?  . Routine general medical examination at a health care facility Yes  . Screen for colon cancer   . Essential hypertension, benign   . Heart murmur   . Dyslipidemia   . Tobacco use disorder   . Screening for prostate cancer   . Need for hepatitis B vaccination   . Need for MMR vaccine     Physical exam - discussed healthy lifestyle, diet, exercise, preventative care, vaccinations, and addressed their concerns.   Screen for colon cancer-referral for first colonoscopy Hypertension-advise he needs to come in at least once yearly for followup, restart lisinopril, higher dose 10 mg daily Heart murmur-we'll send for baseline echo, he is asymptomatic Dyslipidemia-he had run out of his medication but has been compliant, restart Crestor 20 mg daily, low-cholesterol diet Tobacco use-advise he try and completely stop tobacco given the health risks discussed PSA and prostate exam today for routine screening as discussed Hepatitis B #2 vaccine given today, return 4 months for hepatitis B #3 Administered final MMR vaccine today. We have given a booster in 2012 when he was found to be not immune Follow-up pending studies

## 2013-09-10 LAB — PSA: PSA: 0.6 ng/mL (ref <=4.00)

## 2013-09-11 DIAGNOSIS — Z9289 Personal history of other medical treatment: Secondary | ICD-10-CM

## 2013-09-11 HISTORY — PX: COLONOSCOPY: SHX174

## 2013-09-11 HISTORY — DX: Personal history of other medical treatment: Z92.89

## 2013-09-12 ENCOUNTER — Encounter: Payer: Self-pay | Admitting: Family Medicine

## 2013-09-17 ENCOUNTER — Encounter: Payer: Self-pay | Admitting: Internal Medicine

## 2013-09-19 ENCOUNTER — Other Ambulatory Visit: Payer: Self-pay | Admitting: Family Medicine

## 2013-09-19 ENCOUNTER — Telehealth: Payer: Self-pay | Admitting: Family Medicine

## 2013-09-19 DIAGNOSIS — R011 Cardiac murmur, unspecified: Secondary | ICD-10-CM

## 2013-09-19 NOTE — Telephone Encounter (Signed)
2 D ECHO PRIOR AUTH # [id number] EXP- 10/18/13

## 2013-09-22 ENCOUNTER — Telehealth (HOSPITAL_COMMUNITY): Payer: Self-pay | Admitting: *Deleted

## 2013-09-29 ENCOUNTER — Ambulatory Visit (HOSPITAL_COMMUNITY)
Admission: RE | Admit: 2013-09-29 | Discharge: 2013-09-29 | Disposition: A | Discharge status: Home / Self Care | Payer: BC Managed Care – PPO | Source: Ambulatory Visit | Attending: Internal Medicine | Admitting: Internal Medicine

## 2013-09-29 DIAGNOSIS — R011 Cardiac murmur, unspecified: Secondary | ICD-10-CM | POA: Insufficient documentation

## 2013-09-29 DIAGNOSIS — I10 Essential (primary) hypertension: Secondary | ICD-10-CM | POA: Insufficient documentation

## 2013-09-29 DIAGNOSIS — Q2111 Secundum atrial septal defect: Secondary | ICD-10-CM | POA: Insufficient documentation

## 2013-09-29 DIAGNOSIS — Q211 Atrial septal defect: Secondary | ICD-10-CM | POA: Insufficient documentation

## 2013-09-29 DIAGNOSIS — E785 Hyperlipidemia, unspecified: Secondary | ICD-10-CM | POA: Insufficient documentation

## 2013-09-29 NOTE — Progress Notes (Signed)
2D Echo Performed 09/29/2013    Ola Fawver, RCS  

## 2013-10-01 ENCOUNTER — Encounter: Payer: Self-pay | Admitting: Medical

## 2013-10-23 ENCOUNTER — Ambulatory Visit (AMBULATORY_SURGERY_CENTER): Payer: Self-pay | Admitting: *Deleted

## 2013-10-23 VITALS — Ht 73.0 in | Wt 227.0 lb

## 2013-10-23 DIAGNOSIS — Z1211 Encounter for screening for malignant neoplasm of colon: Secondary | ICD-10-CM

## 2013-10-23 MED ORDER — NA SULFATE-K SULFATE-MG SULF 17.5-3.13-1.6 GM/177ML PO SOLN
1.0000 | Freq: Once | ORAL | Status: DC
Start: 2013-10-23 — End: 2013-12-30

## 2013-10-23 NOTE — Progress Notes (Signed)
No allergies to eggs or soy. No problems with anesthesia.  

## 2013-10-28 ENCOUNTER — Encounter: Payer: Self-pay | Admitting: Internal Medicine

## 2013-11-06 ENCOUNTER — Encounter: Payer: BC Managed Care – PPO | Admitting: Internal Medicine

## 2013-12-30 ENCOUNTER — Encounter: Payer: Self-pay | Admitting: Internal Medicine

## 2013-12-30 ENCOUNTER — Ambulatory Visit (AMBULATORY_SURGERY_CENTER): Payer: BC Managed Care – PPO | Admitting: Internal Medicine

## 2013-12-30 VITALS — BP 131/99 | HR 59 | Temp 97.8°F | Resp 33 | Ht 73.0 in | Wt 227.0 lb

## 2013-12-30 DIAGNOSIS — D126 Benign neoplasm of colon, unspecified: Secondary | ICD-10-CM

## 2013-12-30 DIAGNOSIS — K5730 Diverticulosis of large intestine without perforation or abscess without bleeding: Secondary | ICD-10-CM

## 2013-12-30 DIAGNOSIS — Z1211 Encounter for screening for malignant neoplasm of colon: Secondary | ICD-10-CM

## 2013-12-30 MED ORDER — SODIUM CHLORIDE 0.9 % IV SOLN: 500.0000 mL | INTRAVENOUS | Status: DC

## 2013-12-30 NOTE — Progress Notes (Signed)
Called to room to assist during endoscopic procedure.  Patient ID and intended procedure confirmed with present staff. Received instructions for my participation in the procedure from the performing physician.  

## 2013-12-30 NOTE — Op Note (Signed)
[facility]  [address] [place], [id number]   COLONOSCOPY PROCEDURE REPORT  PATIENT: Chris Skinner, Chris Skinner  MR#: [id number] BIRTHDATE: 1966/03/16 , 48  yrs. old GENDER: Male ENDOSCOPIST: Gatha Mayer, MD, [facility] REFERRED BW:[employer], M.D. PROCEDURE DATE:  12/30/2013 PROCEDURE:   Colonoscopy with biopsy First Screening Colonoscopy - Avg.  risk and is 50 yrs.  old or older Yes.  Prior Negative Screening - Now for repeat screening. N/A  History of Adenoma - Now for follow-up colonoscopy & has been > or = to 3 yrs.  N/A  Polyps Removed Today? Yes. ASA CLASS:   Class II INDICATIONS:average risk screening and first colonoscopy. MEDICATIONS: propofol (Diprivan) 250mg  IV, MAC sedation, administered by CRNA, and These medications were titrated to patient response per physician's verbal order  DESCRIPTION OF PROCEDURE:   After the risks benefits and alternatives of the procedure were thoroughly explained, informed consent was obtained.  A digital rectal exam revealed no abnormalities of the rectum, A digital rectal exam revealed no prostatic nodules, and A digital rectal exam revealed the prostate was not enlarged.   The LB [id number]  endoscope was introduced through the anus and advanced to the cecum, which was identified by both the appendix and ileocecal valve. No adverse events experienced.   The quality of the prep was Suprep good  The instrument was then slowly withdrawn as the colon was fully examined.   COLON FINDINGS: Two diminutive sessile polyps were found at the splenic flexure and in the sigmoid colon.  A polypectomy was performed with cold forceps.  The resection was complete and the polyp tissue was completely retrieved.   Mild diverticulosis was noted.  Otherwise normal colon including right colon retroflexion. Retroflexed views revealed no abnormalities. The time to cecum=1 minutes 04 seconds.  Withdrawal time=10 minutes 55 seconds.  The scope  was withdrawn and the procedure completed. COMPLICATIONS: There were no complications.  ENDOSCOPIC IMPRESSION: 1.   Two diminutive sessile polyps were found at the splenic flexure and in the sigmoid colon; polypectomy was performed with cold forceps 2.   Mild diverticulosis was noted - otherwise normal colon, good prep - first exam  RECOMMENDATIONS: Timing of repeat colonoscopy will be determined by pathology findings.   eSigned:  Gatha Mayer, MD, [facility] 12/30/2013 9:42 AM   cc: The Patient and [name], MD

## 2013-12-30 NOTE — Patient Instructions (Addendum)
I found and removed two small polyps today - they look benign.  You also have a condition called diverticulosis - common and not usually a problem. Please read the handout provided.  I will let you know pathology results and when to have another routine colonoscopy by mail.  I appreciate the opportunity to care for you. Gatha Mayer, MD, FACG   YOU HAD AN ENDOSCOPIC PROCEDURE TODAY AT [facility]: Refer to the procedure report that was given to you for any specific questions about what was found during the examination.  If the procedure report does not answer your questions, please call your gastroenterologist to clarify.  If you requested that your care partner not be given the details of your procedure findings, then the procedure report has been included in a sealed envelope for you to review at your convenience later.  YOU SHOULD EXPECT: Some feelings of bloating in the abdomen. Passage of more gas than usual.  Walking can help get rid of the air that was put into your GI tract during the procedure and reduce the bloating. If you had a lower endoscopy (such as a colonoscopy or flexible sigmoidoscopy) you may notice spotting of blood in your stool or on the toilet paper. If you underwent a bowel prep for your procedure, then you may not have a normal bowel movement for a few days.  DIET: Your first meal following the procedure should be a light meal and then it is ok to progress to your normal diet.  A half-sandwich or bowl of soup is an example of a good first meal.  Heavy or fried foods are harder to digest and may make you feel nauseous or bloated.  Likewise meals heavy in dairy and vegetables can cause extra gas to form and this can also increase the bloating.  Drink plenty of fluids but you should avoid alcoholic beverages for 24 hours.  ACTIVITY: Your care partner should take you home directly after the procedure.  You should plan to take it easy, moving slowly for the  rest of the day.  You can resume normal activity the day after the procedure however you should NOT DRIVE or use heavy machinery for 24 hours (because of the sedation medicines used during the test).    SYMPTOMS TO REPORT IMMEDIATELY: A gastroenterologist can be reached at any hour.  During normal business hours, 8:30 AM to 5:00 PM Monday through Friday, call [phone].  After hours and on weekends, please call the GI answering service at [phone] who will take a message and have the physician on call contact you.   Following lower endoscopy (colonoscopy or flexible sigmoidoscopy):  Excessive amounts of blood in the stool  Significant tenderness or worsening of abdominal pains  Swelling of the abdomen that is new, acute  Fever of 100F or higher  FOLLOW UP: If any biopsies were taken you will be contacted by phone or by letter within the next 1-3 weeks.  Call your gastroenterologist if you have not heard about the biopsies in 3 weeks.  Our staff will call the home number listed on your records the next business day following your procedure to check on you and address any questions or concerns that you may have at that time regarding the information given to you following your procedure. This is a courtesy call and so if there is no answer at the home number and we have not heard from you through the emergency physician on  call, we will assume that you have returned to your regular daily activities without incident.  SIGNATURES/CONFIDENTIALITY: You and/or your care partner have signed paperwork which will be entered into your electronic medical record.  These signatures attest to the fact that that the information above on your After Visit Summary has been reviewed and is understood.  Full responsibility of the confidentiality of this discharge information lies with you and/or your care-partner.

## 2013-12-30 NOTE — Progress Notes (Signed)
Lidocaine-40mg IV prior to Propofol InductionPropofol given over incremental dosages 

## 2013-12-31 ENCOUNTER — Telehealth: Payer: Self-pay | Admitting: *Deleted

## 2013-12-31 NOTE — Telephone Encounter (Signed)
  Follow up Call-  Call back number 12/30/2013  Post procedure Call Back phone  # [phone]  Permission to leave phone message Yes     Patient questions:  Do you have a fever, pain , or abdominal swelling? no Pain Score  0 *  Have you tolerated food without any problems? yes  Have you been able to return to your normal activities? yes  Do you have any questions about your discharge instructions: Diet   no Medications  no Follow up visit  no  Do you have questions or concerns about your Care? no  Actions: * If pain score is 4 or above: No action needed, pain <4.

## 2014-01-05 ENCOUNTER — Encounter: Payer: Self-pay | Admitting: Internal Medicine

## 2014-01-05 DIAGNOSIS — Z860100 Personal history of colon polyps, unspecified: Secondary | ICD-10-CM

## 2014-01-05 HISTORY — DX: Personal history of colon polyps, unspecified: Z86.0100

## 2014-01-05 HISTORY — DX: Personal history of colonic polyps: Z86.010

## 2014-01-05 NOTE — Progress Notes (Signed)
Quick Note:  Diminutive adenoma and diminutive perineuroma Repeat colonoscopy 2020 ______

## 2014-10-09 ENCOUNTER — Telehealth: Payer: Self-pay | Admitting: Family Medicine

## 2014-10-09 DIAGNOSIS — E785 Hyperlipidemia, unspecified: Secondary | ICD-10-CM

## 2014-10-09 DIAGNOSIS — I10 Essential (primary) hypertension: Secondary | ICD-10-CM

## 2014-10-09 NOTE — Telephone Encounter (Signed)
Pt called and made a cpe appt with you. Past couple of time he has seen shane but he states he is a long time pt of yours and wants to see you. Pt is schedule for end of Feb. And he needs his meds refilled until then. Please send refills of crestor/lisinopril and Aspirin sent to [facility].

## 2014-10-11 MED ORDER — LISINOPRIL 10 MG PO TABS: 10.0000 mg | ORAL_TABLET | Freq: Every day | ORAL | Status: DC

## 2014-10-11 MED ORDER — ROSUVASTATIN CALCIUM 20 MG PO TABS: 20.0000 mg | ORAL_TABLET | Freq: Every day | ORAL | Status: DC

## 2014-11-04 ENCOUNTER — Encounter: Payer: Self-pay | Admitting: Family Medicine

## 2014-11-04 ENCOUNTER — Ambulatory Visit (INDEPENDENT_AMBULATORY_CARE_PROVIDER_SITE_OTHER): Payer: BC Managed Care – PPO | Admitting: Family Medicine

## 2014-11-04 VITALS — BP 122/82 | HR 76 | Ht 73.0 in | Wt 231.0 lb

## 2014-11-04 DIAGNOSIS — Z86010 Personal history of colonic polyps: Secondary | ICD-10-CM

## 2014-11-04 DIAGNOSIS — E785 Hyperlipidemia, unspecified: Secondary | ICD-10-CM

## 2014-11-04 DIAGNOSIS — R5382 Chronic fatigue, unspecified: Secondary | ICD-10-CM

## 2014-11-04 DIAGNOSIS — I10 Essential (primary) hypertension: Secondary | ICD-10-CM

## 2014-11-04 DIAGNOSIS — Q211 Atrial septal defect: Secondary | ICD-10-CM

## 2014-11-04 DIAGNOSIS — Q2112 Patent foramen ovale: Secondary | ICD-10-CM

## 2014-11-04 DIAGNOSIS — Z0000 Encounter for general adult medical examination without abnormal findings: Secondary | ICD-10-CM

## 2014-11-04 LAB — LIPID PANEL
Cholesterol: 226 mg/dL — ABNORMAL HIGH (ref 0–200)
HDL: 46 mg/dL (ref >=40)
LDL CALC: 146 mg/dL — AB (ref 0–99)
TRIGLYCERIDES: 169 mg/dL — AB (ref <150)
Total CHOL/HDL Ratio: 4.9 Ratio
VLDL: 34 mg/dL (ref 0–40)

## 2014-11-04 LAB — COMPREHENSIVE METABOLIC PANEL
ALT: 58 U/L — ABNORMAL HIGH (ref 0–53)
AST: 33 U/L (ref 0–37)
Albumin: 4.6 g/dL (ref 3.5–5.2)
Alkaline Phosphatase: 44 U/L (ref 39–117)
BILIRUBIN TOTAL: 0.9 mg/dL (ref 0.2–1.2)
BUN: 14 mg/dL (ref 6–23)
CHLORIDE: 100 meq/L (ref 96–112)
CO2: 25 meq/L (ref 19–32)
CREATININE: 0.8 mg/dL (ref 0.50–1.35)
Calcium: 9.4 mg/dL (ref 8.4–10.5)
GLUCOSE: 102 mg/dL — AB (ref 70–99)
Potassium: 4.6 mEq/L (ref 3.5–5.3)
Sodium: 135 mEq/L (ref 135–145)
Total Protein: 7.2 g/dL (ref 6.0–8.3)

## 2014-11-04 LAB — CBC WITH DIFFERENTIAL/PLATELET
BASOS PCT: 0 % (ref 0–1)
Basophils Absolute: 0 10*3/uL (ref 0.0–0.1)
Eosinophils Absolute: 0.6 10*3/uL (ref 0.0–0.7)
Eosinophils Relative: 6 % — ABNORMAL HIGH (ref 0–5)
HEMATOCRIT: 45.4 % (ref 39.0–52.0)
HEMOGLOBIN: 15.9 g/dL (ref 13.0–17.0)
Lymphocytes Relative: 30 % (ref 12–46)
Lymphs Abs: 2.9 10*3/uL (ref 0.7–4.0)
MCH: 31.8 pg (ref 26.0–34.0)
MCHC: 35 g/dL (ref 30.0–36.0)
MCV: 90.8 fL (ref 78.0–100.0)
MONO ABS: 0.8 10*3/uL (ref 0.1–1.0)
MONOS PCT: 8 % (ref 3–12)
MPV: 8.9 fL (ref 8.6–12.4)
Neutro Abs: 5.4 10*3/uL (ref 1.7–7.7)
Neutrophils Relative %: 56 % (ref 43–77)
Platelets: 338 10*3/uL (ref 150–400)
RBC: 5 MIL/uL (ref 4.22–5.81)
RDW: 13.1 % (ref 11.5–15.5)
WBC: 9.7 10*3/uL (ref 4.0–10.5)

## 2014-11-04 LAB — TSH: TSH: 0.941 u[IU]/mL (ref 0.350–4.500)

## 2014-11-04 MED ORDER — LISINOPRIL 10 MG PO TABS: 10.0000 mg | ORAL_TABLET | Freq: Every day | ORAL | Status: DC

## 2014-11-04 MED ORDER — ROSUVASTATIN CALCIUM 20 MG PO TABS: 20.0000 mg | ORAL_TABLET | Freq: Every day | ORAL | Status: DC

## 2014-11-04 NOTE — Progress Notes (Signed)
   Subjective:    Patient ID: Chris Skinner, male    DOB: Jan 26, 1966, 49 y.o.   MRN: [id number]  HPI He is here for complete examination. He did have a colonoscopy last year which did show some polyps. He also has a history of hyperlipidemia. Continues on his Crestor and is also taking Zestril. He does complain of fatigue over the last 6 months but no skin changes, hair changes, cold intolerance. His work is going well as a Paediatric nurse. He was seen recently for cardiac evaluation and did have echo evidence of PFO. He is having no chest pain, shortness of breath. Family and social history was reviewed.   Review of Systems  All other systems reviewed and are negative.      Objective:   Physical Exam BP 122/82 mmHg  Pulse 76  Ht 6\' 1"  (1.854 m)  Wt 231 lb (104.781 kg)  BMI 30.48 kg/m2  SpO2 99%  General Appearance:    Alert, cooperative, no distress, appears stated age  Head:    Normocephalic, without obvious abnormality, atraumatic  Eyes:    PERRL, conjunctiva/corneas clear, EOM's intact, fundi    benign  Ears:    Normal TM's and external ear canals  Nose:   Nares normal, mucosa normal, no drainage or sinus   tenderness  Throat:   Lips, mucosa, and tongue normal; teeth and gums normal  Neck:   Supple, no lymphadenopathy;  thyroid:  no   enlargement/tenderness/nodules; no carotid   bruit or JVD  Back:    Spine nontender, no curvature, ROM normal, no CVA     tenderness  Lungs:     Clear to auscultation bilaterally without wheezes, rales or     ronchi; respirations unlabored  Chest Wall:    No tenderness or deformity   Heart:    Regular rate and rhythm, S1 and S2 normal, 2/6 SEM noted  Breast Exam:    No chest wall tenderness, masses or gynecomastia  Abdomen:     Soft, non-tender, nondistended, normoactive bowel sounds,    no masses, no hepatosplenomegaly        Extremities:   No clubbing, cyanosis or edema  Pulses:   2+ and symmetric all extremities  Skin:    Skin color, texture, turgor normal, no rashes or lesions  Lymph nodes:   Cervical, supraclavicular, and axillary nodes normal  Neurologic:   CNII-XII intact, normal strength, sensation and gait; reflexes 2+ and symmetric throughout          Psych:   Normal mood, affect, hygiene and grooming.         Assessment & Plan:  Routine general medical examination at a health care facility - Plan: CBC with Differential/Platelet, Comprehensive metabolic panel, Lipid panel, TSH  Hyperlipidemia - Plan: Lipid panel  History of colonic polyps  Essential hypertension, benign - Plan: CBC with Differential/Platelet, Comprehensive metabolic panel, lisinopril (PRINIVIL,ZESTRIL) 10 MG tablet  Chronic fatigue - Plan: TSH  PFO (patent foramen ovale)  Dyslipidemia - Plan: rosuvastatin (CRESTOR) 20 MG tablet I encouraged him to continue to take good care of himself.

## 2015-02-15 ENCOUNTER — Telehealth: Payer: Self-pay

## 2015-02-15 ENCOUNTER — Ambulatory Visit (INDEPENDENT_AMBULATORY_CARE_PROVIDER_SITE_OTHER): Payer: BC Managed Care – PPO | Admitting: Family Medicine

## 2015-02-15 DIAGNOSIS — K429 Umbilical hernia without obstruction or gangrene: Secondary | ICD-10-CM

## 2015-02-15 DIAGNOSIS — R0683 Snoring: Secondary | ICD-10-CM | POA: Diagnosis not present

## 2015-02-15 DIAGNOSIS — R5383 Other fatigue: Secondary | ICD-10-CM | POA: Diagnosis not present

## 2015-02-15 NOTE — Telephone Encounter (Signed)
Called pt to inform him of appointment at [facility] [place] [facility] June 21 at 4:15 but arrive at 3:45 to see Dr.Gerkins

## 2015-02-15 NOTE — Progress Notes (Signed)
   Subjective:    Patient ID: [name], male    DOB: Jan 20, 1966, 49 y.o.   MRN: [id number]  HPI He is here for consult concerning multiple issues. He does have evidence of an umbilical hernia and is interested in having that repaired. He also complains of decreased energy. He does fall asleep in front of TV and does get quite drowsy with driving. He also does snore a lot. He also complains of intermittent feeling of food getting stuck and occasionally has had to vomit. He then mentioned memory issues.   Review of Systems     Objective:   Physical Exam Alert and in no distress. Umbilical hernia noted. It is approximately 1-2 cm in size.Epworth sleepiness scale was 14.       Assessment & Plan:  Umbilical hernia without obstruction and without gangrene - Plan: Ambulatory referral to General Surgery  Other fatigue - Plan: Testosterone, Ambulatory referral to Sleep Studies, CANCELED: Nocturnal polysomnography  Snoring - Plan: Ambulatory referral to Sleep Studies, CANCELED: Nocturnal polysomnography I will get a sleep study on him. We did not fully discussed the GI issue and I will discuss that with him at a later date. He also mentioned memory issues and again I explained that this could possibly be related to his sleep issues which could easily be sleep apnea. We discussed remedies including weight reduction and possible dental appliance. Over 25 minutes spent in coordination of care and counseling.

## 2015-02-16 LAB — TESTOSTERONE: TESTOSTERONE: 365 ng/dL (ref 300–890)

## 2015-02-16 NOTE — Progress Notes (Signed)
   Subjective:    Patient ID: Chris Skinner, male    DOB: 1966/02/29, 49 y.o.   MRN: [id number]  HPI    Review of Systems     Objective:   Physical Exam        Assessment & Plan:  I gave him the results of the testosterone which was normal. He is having some difficulty with swallowing and feeling as if food gets stuck. It is questionable since it is intermittent in nature. Did recommend he call me after he eats lunch and if he again feels as if something is getting stuck, I will refer him for evaluation of possible esophageal stricture.

## 2015-06-22 ENCOUNTER — Encounter: Payer: Self-pay | Admitting: Family Medicine

## 2015-06-22 ENCOUNTER — Ambulatory Visit (INDEPENDENT_AMBULATORY_CARE_PROVIDER_SITE_OTHER): Payer: BC Managed Care – PPO | Admitting: Family Medicine

## 2015-06-22 VITALS — BP 122/72 | HR 64 | Temp 97.6°F | Wt 232.4 lb

## 2015-06-22 DIAGNOSIS — R0683 Snoring: Secondary | ICD-10-CM | POA: Diagnosis not present

## 2015-06-22 DIAGNOSIS — R350 Frequency of micturition: Secondary | ICD-10-CM | POA: Diagnosis not present

## 2015-06-22 LAB — POCT URINALYSIS DIPSTICK
Bilirubin, UA: NEGATIVE
Blood, UA: NEGATIVE
GLUCOSE UA: NEGATIVE
Ketones, UA: NEGATIVE
Leukocytes, UA: NEGATIVE
Nitrite, UA: NEGATIVE
PH UA: 6
PROTEIN UA: NEGATIVE
Spec Grav, UA: 1.025
UROBILINOGEN UA: NEGATIVE

## 2015-06-22 MED ORDER — TAMSULOSIN HCL 0.4 MG PO CAPS: 0.4000 mg | ORAL_CAPSULE | Freq: Every day | ORAL | Status: DC

## 2015-06-22 NOTE — Patient Instructions (Signed)
Try the Flomax and let me know in next 2-3 days if not improving.

## 2015-06-22 NOTE — Progress Notes (Signed)
   Subjective:    Patient ID: Chris Skinner, male    DOB: 19-Jul-1966, 49 y.o.   MRN: [id number]  HPI  Chief Complaint  Patient presents with  . UTI    UTI- frequency, since sunday. no burning    He is here for a 3 day history of urinary frequency, urgency, and feeling like he is unable to empty his bladder. Reports he is having difficulty starting his stream in the mornings but later in day his stream is ok. He reports nocturia and is getting up 2-3 times per night to urinate. No new medications. Denies history of BPH. Denies difficulty getting or maintaining an erection. He recalls taking an antibiotic several years ago for prostatitis.  Denies hematuria,  fever, chills, nausea, vomiting, back pain, abdominal pain, or pain in rectum. Also denies increased thirst or hunger, or  history of hyperglycemia Denies history of STIs. He is in a monogamous relationship with a male. Former smoker.   He also complains of snoring and daytime fatigue and states "I know I have sleep apnea but I am not going to wear a breathing machine". He denies having sleep study in past and states he does not want to do one.   Reviewed allergies, medications, past medical and social history.  Review of Systems Pertinent positives and negatives in the history of present illness.    Objective:   Physical Exam  Constitutional: He appears well-developed and well-nourished. No distress.  Cardiovascular: Normal rate, regular rhythm and normal heart sounds.   Pulmonary/Chest: Effort normal and breath sounds normal.  Abdominal: Soft. Bowel sounds are normal. He exhibits no distension. There is no tenderness. There is no rebound and no CVA tenderness.  Genitourinary: Rectum normal. Rectal exam shows no tenderness. Guaiac negative stool. Prostate is not tender.  Questionable enlarged prostate, smooth however unable to reach and palpate majority of gland.    BP 122/72 mmHg  Pulse 64  Temp(Src) 97.6 F (36.4 C) (Oral)   Wt 232 lb 6.4 oz (105.416 kg)  Urinalysis dipstick negative    Assessment & Plan:  Urinary frequency - Plan: POCT urinalysis dipstick  Snoring  Discussed BPH symptoms vs prostatitis. He will try Flomax and call and let me know if no improvement. Will consider antibiotic if no improvement. Also counseled on sleep apnea symptoms, risks and consequences. He would like to try a dental device for snoring before doing a sleep study and will talk to dentist about this.

## 2015-06-24 ENCOUNTER — Telehealth: Payer: Self-pay | Admitting: Medical

## 2015-06-24 ENCOUNTER — Other Ambulatory Visit: Payer: Self-pay | Admitting: Medical

## 2015-06-24 MED ORDER — CIPROFLOXACIN HCL 500 MG PO TABS: 500.0000 mg | ORAL_TABLET | Freq: Two times a day (BID) | ORAL | Status: DC

## 2015-06-24 NOTE — Telephone Encounter (Signed)
Pt called and was seen Tuesday and said vickie told him to call back if he wasn't any better, she gave him Flomax, said if that didn't help to call back to get a antibiotic, [facility] [address]. Pt can be reached at [phone] to let know when sent to the pharmacy,. Said his urine test come back negative but it could be a infection not showing up in the test.

## 2015-06-24 NOTE — Telephone Encounter (Signed)
Pt informed and said he will come in once antibiotic finished

## 2015-06-24 NOTE — Telephone Encounter (Signed)
Begin Cipro antibiotic for possible prostatitis, BID x 2 weeks!  Then return for recheck

## 2015-10-21 ENCOUNTER — Other Ambulatory Visit: Payer: Self-pay | Admitting: Family Medicine

## 2016-01-31 ENCOUNTER — Other Ambulatory Visit: Payer: Self-pay | Admitting: Family Medicine

## 2016-01-31 NOTE — Telephone Encounter (Signed)
Is this okay to refill? 

## 2016-02-28 ENCOUNTER — Other Ambulatory Visit: Payer: Self-pay

## 2016-02-28 DIAGNOSIS — R0683 Snoring: Secondary | ICD-10-CM

## 2016-02-28 DIAGNOSIS — R5383 Other fatigue: Secondary | ICD-10-CM

## 2016-03-27 ENCOUNTER — Ambulatory Visit (HOSPITAL_BASED_OUTPATIENT_CLINIC_OR_DEPARTMENT_OTHER): Payer: BC Managed Care – PPO | Attending: Family Medicine | Admitting: Internal Medicine

## 2016-03-27 VITALS — Ht 73.5 in | Wt 230.0 lb

## 2016-03-27 DIAGNOSIS — G4733 Obstructive sleep apnea (adult) (pediatric): Secondary | ICD-10-CM | POA: Diagnosis present

## 2016-03-27 DIAGNOSIS — R5383 Other fatigue: Secondary | ICD-10-CM | POA: Insufficient documentation

## 2016-03-27 DIAGNOSIS — Z79899 Other long term (current) drug therapy: Secondary | ICD-10-CM | POA: Insufficient documentation

## 2016-03-27 DIAGNOSIS — R0683 Snoring: Secondary | ICD-10-CM | POA: Diagnosis not present

## 2016-04-02 NOTE — Procedures (Deleted)
Patient Name: Chris Skinner, Chris Skinner Study Date: 03/27/2016 Gender: Male D.O.B: 07/24/1966 Age (years): 50 Referring Provider: John C Lalonde Height (inches): 74 Interpreting Physician: Clinton Young MD, ABSM Weight (lbs): 230 RPSGT: Davis, Rico BMI: 30 MRN: [id number] Neck Size: 17.00 CLINICAL INFORMATION Sleep Study Type: Split Night CPAP   Indication for sleep study: Excessive Daytime Sleepiness, Snoring   Epworth Sleepiness Score: 9/24 SLEEP STUDY TECHNIQUE As per the AASM Manual for the Scoring of Sleep and Associated Events v2.3 (April 2016) with a hypopnea requiring 4% desaturations. The channels recorded and monitored were frontal, central and occipital EEG, electrooculogram (EOG), submentalis EMG (chin), nasal and oral airflow, thoracic and abdominal wall motion, anterior tibialis EMG, snore microphone, electrocardiogram, and pulse oximetry. Continuous positive airway pressure (CPAP) was initiated when the patient met split night criteria and was titrated according to treat sleep-disordered breathing.  MEDICATIONS Medications taken by the patient : charted for review Medications administered by patient during sleep study : .LISINOPRIL, ROSUVASTATIN  RESPIRATORY PARAMETERS Diagnostic Total AHI (/hr): 27.9 RDI (/hr): 42.0 OA Index (/hr): 13.4 CA Index (/hr): 1.1 REM AHI (/hr): 56.7 NREM AHI (/hr): 24.5 Supine AHI (/hr): 17.1 Non-supine AHI (/hr): 28.61 Min O2 Sat (%): 87.00 Mean O2 (%): 93.48 Time below 88% (min): 0.8   Titration Optimal Pressure (cm): 9 AHI at Optimal Pressure (/hr): 1.8 Min O2 at Optimal Pressure (%): 93.0 Supine % at Optimal (%): 1 Sleep % at Optimal (%): 99    SLEEP ARCHITECTURE The recording time for the entire night was 401.0 minutes. During a baseline period of 216.1 minutes, the patient slept for 169.9 minutes in REM and nonREM, yielding a sleep efficiency of 78.6%. Sleep onset after lights out was 12.2 minutes with a REM latency of 184.5 minutes. The  patient spent 29.66% of the night in stage N1 sleep, 59.75% in stage N2 sleep, 0.00% in stage N3 and 10.60% in REM. During the titration period of 179.2 minutes, the patient slept for 159.8 minutes in REM and nonREM, yielding a sleep efficiency of 89.2%. Sleep onset after CPAP initiation was 5.4 minutes with a REM latency of 0.5 minutes. The patient spent 9.23% of the night in stage N1 sleep, 61.34% in stage N2 sleep, 0.00% in stage N3 and 29.42% in REM.  CARDIAC DATA The 2 lead EKG demonstrated sinus rhythm. The mean heart rate was 64.06 beats per minute. Other EKG findings include: None.  LEG MOVEMENT DATA The total Periodic Limb Movements of Sleep (PLMS) were 0. The PLMS index was 0.00 .  IMPRESSIONS - Moderate obstructive sleep apnea occurred during the diagnostic portion of the study(AHI = 27.9/hour). An optimal PAP pressure was selected for this patient ( 9 cm of water) - No significant central sleep apnea occurred during the diagnostic portion of the study (CAI = 1.1/hour). - The patient had minimal or no oxygen desaturation during the diagnostic portion of the study (Min O2 = 87.00%) - The patient snored with Loud snoring volume during the diagnostic portion of the study. - No cardiac abnormalities were noted during this study. - Clinically significant periodic limb movements did not occur during sleep.  DIAGNOSIS - Obstructive Sleep Apnea (327.23 [G47.33 ICD-10])  RECOMMENDATIONS - Trial of CPAP therapy on 9 cm H2O with a Large size Fisher&Paykel Nasal Mask Eson mask and heated humidification. - Avoid alcohol, sedatives and other CNS depressants that may worsen sleep apnea and disrupt normal sleep architecture. - Sleep hygiene should be reviewed to assess factors that may improve sleep quality. -   Weight management and regular exercise should be initiated or continued.  [Electronically signed] 04/02/2016 04:29 PM  Baird Lyons MD, [place], American Board of Sleep  Medicine   NPI: [id number]  [place], American Board of Sleep Medicine  ELECTRONICALLY SIGNED ON:  04/02/2016, 4:25 PM [place] PH: [phone]   FX: [phone] [place]

## 2016-04-02 NOTE — Progress Notes (Signed)
Patient Name: Chris Skinner, Chris Skinner Study Date: 03/27/2016 Gender: Male D.O.B: 07/13/1966 Age (years): 50 Referring Provider: John C Lalonde Height (inches): 74 Interpreting Physician: Clova Morlock MD, ABSM Weight (lbs): 230 RPSGT: Davis, Rico BMI: 30 MRN: [id number] Neck Size: 17.00 CLINICAL INFORMATION Sleep Study Type: Split Night CPAP   Indication for sleep study: Excessive Daytime Sleepiness, Snoring   Epworth Sleepiness Score: 9/24 SLEEP STUDY TECHNIQUE As per the AASM Manual for the Scoring of Sleep and Associated Events v2.3 (April 2016) with a hypopnea requiring 4% desaturations. The channels recorded and monitored were frontal, central and occipital EEG, electrooculogram (EOG), submentalis EMG (chin), nasal and oral airflow, thoracic and abdominal wall motion, anterior tibialis EMG, snore microphone, electrocardiogram, and pulse oximetry. Continuous positive airway pressure (CPAP) was initiated when the patient met split night criteria and was titrated according to treat sleep-disordered breathing.  MEDICATIONS Medications taken by the patient : charted for review Medications administered by patient during sleep study : .LISINOPRIL, ROSUVASTATIN  RESPIRATORY PARAMETERS Diagnostic Total AHI (/hr): 27.9 RDI (/hr): 42.0 OA Index (/hr): 13.4 CA Index (/hr): 1.1 REM AHI (/hr): 56.7 NREM AHI (/hr): 24.5 Supine AHI (/hr): 17.1 Non-supine AHI (/hr): 28.61 Min O2 Sat (%): 87.00 Mean O2 (%): 93.48 Time below 88% (min): 0.8   Titration Optimal Pressure (cm): 9 AHI at Optimal Pressure (/hr): 1.8 Min O2 at Optimal Pressure (%): 93.0 Supine % at Optimal (%): 1 Sleep % at Optimal (%): 99    SLEEP ARCHITECTURE The recording time for the entire night was 401.0 minutes. During a baseline period of 216.1 minutes, the patient slept for 169.9 minutes in REM and nonREM, yielding a sleep efficiency of 78.6%. Sleep onset after lights out was 12.2 minutes with a REM latency of 184.5 minutes. The  patient spent 29.66% of the night in stage N1 sleep, 59.75% in stage N2 sleep, 0.00% in stage N3 and 10.60% in REM. During the titration period of 179.2 minutes, the patient slept for 159.8 minutes in REM and nonREM, yielding a sleep efficiency of 89.2%. Sleep onset after CPAP initiation was 5.4 minutes with a REM latency of 0.5 minutes. The patient spent 9.23% of the night in stage N1 sleep, 61.34% in stage N2 sleep, 0.00% in stage N3 and 29.42% in REM.  CARDIAC DATA The 2 lead EKG demonstrated sinus rhythm. The mean heart rate was 64.06 beats per minute. Other EKG findings include: None.  LEG MOVEMENT DATA The total Periodic Limb Movements of Sleep (PLMS) were 0. The PLMS index was 0.00 .  IMPRESSIONS - Moderate obstructive sleep apnea occurred during the diagnostic portion of the study(AHI = 27.9/hour). An optimal PAP pressure was selected for this patient ( 9 cm of water) - No significant central sleep apnea occurred during the diagnostic portion of the study (CAI = 1.1/hour). - The patient had minimal or no oxygen desaturation during the diagnostic portion of the study (Min O2 = 87.00%) - The patient snored with Loud snoring volume during the diagnostic portion of the study. - No cardiac abnormalities were noted during this study. - Clinically significant periodic limb movements did not occur during sleep.  DIAGNOSIS - Obstructive Sleep Apnea (327.23 [G47.33 ICD-10])  RECOMMENDATIONS - Trial of CPAP therapy on 9 cm H2O with a Large size Fisher&Paykel Nasal Mask Eson mask and heated humidification. - Avoid alcohol, sedatives and other CNS depressants that may worsen sleep apnea and disrupt normal sleep architecture. - Sleep hygiene should be reviewed to assess factors that may improve sleep quality. -   Weight management and regular exercise should be initiated or continued.  [Electronically signed] 04/02/2016 04:29 PM  Baird Lyons MD, [place], American Board of Sleep  Medicine   NPI: [id number]  [place], American Board of Sleep Medicine  ELECTRONICALLY SIGNED ON:  04/02/2016, 4:32 PM [place] PH: [phone]   FX: [phone] [place]

## 2016-04-03 DIAGNOSIS — G4733 Obstructive sleep apnea (adult) (pediatric): Secondary | ICD-10-CM

## 2016-04-18 ENCOUNTER — Encounter (HOSPITAL_BASED_OUTPATIENT_CLINIC_OR_DEPARTMENT_OTHER): Payer: BC Managed Care – PPO

## 2016-04-24 ENCOUNTER — Telehealth: Payer: Self-pay | Admitting: Family Medicine

## 2016-04-24 NOTE — Telephone Encounter (Signed)
Pt called for sleep study results. JCL did not receive via epic so copy was made and placed on [employer].

## 2016-04-24 NOTE — Telephone Encounter (Signed)
I left him a message on his cell phone concerning positive for OSA. Prescription is on your desk. Reinforced the need to use this for a full month, then get a readout so we can assess efficacy

## 2016-04-25 ENCOUNTER — Ambulatory Visit (INDEPENDENT_AMBULATORY_CARE_PROVIDER_SITE_OTHER): Payer: BC Managed Care – PPO | Admitting: Family Medicine

## 2016-04-25 DIAGNOSIS — G4733 Obstructive sleep apnea (adult) (pediatric): Secondary | ICD-10-CM | POA: Diagnosis not present

## 2016-04-25 NOTE — Telephone Encounter (Signed)
Pt is coming in today

## 2016-04-25 NOTE — Progress Notes (Signed)
   Subjective:    Patient ID: Chris Skinner, male    DOB: [birth year], 50 y.o.   MRN: [id number]  HPI He is here for consultation concerning the recent sleep study which did show evidence of sleep apnea. A split study was done and recommended that he get 9 mm of water with an appropriate mask. He is here for consult concerning this.   Review of Systems     Objective:   Physical Exam Alert and in no distress otherwise not examined       Assessment & Plan:  OSA (obstructive sleep apnea) I discussed the diagnosis of sleep apnea and its relationship to diabetes, hypertension, heart disease and general stamina. Discussed avoiding stimulants as well as CNS depressants. We also discussed sleep hygiene in terms of regular sleep schedule. He will get the equipment, get used to it for one month and get a readout recommended for further consultation concerning efficacy.

## 2016-06-03 NOTE — Procedures (Signed)
Deneise Lever, MD  Pulmonology                                                      Patient Name: Chris Skinner, Chris Skinner Date: 03/27/2016 Gender: Male D.O.B: March 17, 1966 Age (years): 50 Referring Provider: Denita Lung Height (inches): 74 Interpreting Physician: Baird Lyons MD, ABSM Weight (lbs): 230 RPSGT: Madelon Lips BMI: 30 MRN: [id number] Neck Size: 17.00 CLINICAL INFORMATION Sleep Study Type: Split Night CPAP   Indication for sleep study: Excessive Daytime Sleepiness, Snoring   Epworth Sleepiness Score: 9/24 SLEEP STUDY TECHNIQUE As per the AASM Manual for the Scoring of Sleep and Associated Events v2.3 (April 2016) with a hypopnea requiring 4% desaturations. The channels recorded and monitored were frontal, central and occipital EEG, electrooculogram (EOG), submentalis EMG (chin), nasal and oral airflow, thoracic and abdominal wall motion, anterior tibialis EMG, snore microphone, electrocardiogram, and pulse oximetry. Continuous positive airway pressure (CPAP) was initiated when the patient met split night criteria and was titrated according to treat sleep-disordered breathing.  MEDICATIONS Medications taken by the patient : charted for review Medications administered by patient during sleep study : .LISINOPRIL, ROSUVASTATIN  RESPIRATORY PARAMETERS Diagnostic Total AHI (/hr):            27.9     RDI (/hr):         42.0     OA Index (/hr):            13.4     CA Index (/hr):      1.1 REM AHI (/hr):            56.7     NREM AHI (/hr):          24.5     Supine AHI (/hr):         17.1     Non-supine AHI (/hr):        28.61 Min O2 Sat (%):          87.00   Mean O2 (%):  93.48   Time below 88% (min):           0.8                    Titration Optimal Pressure (cm):           9          AHI at Optimal Pressure (/hr):            1.8       Min O2 at Optimal Pressure (%):       93.0 Supine % at Optimal (%):       1          Sleep % at Optimal (%):         99                    SLEEP ARCHITECTURE The recording time for the entire night was 401.0 minutes. During a baseline period of 216.1 minutes, the patient slept for 169.9 minutes in REM and nonREM, yielding a sleep efficiency of 78.6%. Sleep onset after lights out was 12.2 minutes with a REM latency of 184.5 minutes. The patient spent 29.66% of the night in stage N1 sleep, 59.75% in stage N2 sleep, 0.00% in stage N3 and 10.60%  in REM. During the titration period of 179.2 minutes, the patient slept for 159.8 minutes in REM and nonREM, yielding a sleep efficiency of 89.2%. Sleep onset after CPAP initiation was 5.4 minutes with a REM latency of 0.5 minutes. The patient spent 9.23% of the night in stage N1 sleep, 61.34% in stage N2 sleep, 0.00% in stage N3 and 29.42% in REM.  CARDIAC DATA The 2 lead EKG demonstrated sinus rhythm. The mean heart rate was 64.06 beats per minute. Other EKG findings include: None.  LEG MOVEMENT DATA The total Periodic Limb Movements of Sleep (PLMS) were 0. The PLMS index was 0.00 .  IMPRESSIONS - Moderate obstructive sleep apnea occurred during the diagnostic portion of the study(AHI = 27.9/hour). An optimal PAP pressure was selected for this patient ( 9 cm of water) - No significant central sleep apnea occurred during the diagnostic portion of the study (CAI = 1.1/hour). - The patient had minimal or no oxygen desaturation during the diagnostic portion of the study (Min O2 = 87.00%) - The patient snored with Loud snoring volume during the diagnostic portion of the study. - No cardiac abnormalities were noted during this study. - Clinically significant periodic limb movements did not occur during sleep.  DIAGNOSIS - Obstructive Sleep Apnea (327.23 [G47.33 ICD-10])  RECOMMENDATIONS - Trial of CPAP therapy on 9 cm H2O with a Large size Fisher&Paykel Nasal Mask Eson mask and heated humidification. - Avoid alcohol, sedatives and other CNS depressants that may worsen sleep apnea  and disrupt normal sleep architecture. - Sleep hygiene should be reviewed to assess factors that may improve sleep quality. - Weight management and regular exercise should be initiated or continued.  [Electronically signed] 04/02/2016 04:29 PM  Baird Lyons MD, [place], American Board of Sleep Medicine   NPI: [id number]  [place], American Board of Sleep Medicine  ELECTRONICALLY SIGNED ON:  04/02/2016, 4:25 PM [place] PH: [phone]   FX: [phone] [place]

## 2016-08-21 ENCOUNTER — Other Ambulatory Visit: Payer: Self-pay | Admitting: Family Medicine

## 2016-11-01 ENCOUNTER — Other Ambulatory Visit: Payer: Self-pay | Admitting: Family Medicine

## 2017-01-30 ENCOUNTER — Other Ambulatory Visit: Payer: Self-pay | Admitting: Family Medicine

## 2017-03-22 ENCOUNTER — Telehealth: Payer: Self-pay

## 2017-03-22 NOTE — Telephone Encounter (Signed)
Pt scheduled for TB test. Pt aware that he MUST return here Monday for it to be read. His orientation is at 1:30PM on Monday. Victor[place] December

## 2017-03-22 NOTE — Telephone Encounter (Signed)
Pt need TB test, he states he has new employment that he starts Monday and they will read it at his job, but he needs it to be placed this week. Pt is due for OV. Please advise if ok to place pt on schedule for TB read. CB #  [phone]

## 2017-03-22 NOTE — Telephone Encounter (Signed)
yes

## 2017-03-23 ENCOUNTER — Other Ambulatory Visit (INDEPENDENT_AMBULATORY_CARE_PROVIDER_SITE_OTHER): Payer: BC Managed Care – PPO

## 2017-03-23 DIAGNOSIS — Z111 Encounter for screening for respiratory tuberculosis: Secondary | ICD-10-CM

## 2017-03-26 LAB — TB SKIN TEST
INDURATION: 0 mm
TB Skin Test: NEGATIVE

## 2017-05-03 ENCOUNTER — Other Ambulatory Visit: Payer: Self-pay | Admitting: Family Medicine

## 2017-05-03 NOTE — Telephone Encounter (Signed)
Called pt he has retired from [employer] now working at [employer] and will see when next early release date is.  Pt needs cpe but will ok with med check and labs.  He will call us back and aware refilling for 30 days only

## 2017-06-01 ENCOUNTER — Other Ambulatory Visit: Payer: Self-pay | Admitting: Family Medicine

## 2017-06-01 NOTE — Telephone Encounter (Signed)
Pt made aware he is due for f/u.  Advised can only fill medication for 30 days and to call me back to schedule med check. Chris Skinner

## 2017-06-18 ENCOUNTER — Encounter: Payer: BC Managed Care – PPO | Admitting: Family Medicine

## 2017-06-18 ENCOUNTER — Encounter: Payer: Self-pay | Admitting: Family Medicine

## 2017-06-18 ENCOUNTER — Ambulatory Visit (INDEPENDENT_AMBULATORY_CARE_PROVIDER_SITE_OTHER): Payer: BC Managed Care – PPO | Admitting: Family Medicine

## 2017-06-18 ENCOUNTER — Telehealth: Payer: Self-pay | Admitting: Internal Medicine

## 2017-06-18 VITALS — BP 140/110 | HR 75 | Resp 16 | Ht 73.5 in | Wt 231.0 lb

## 2017-06-18 DIAGNOSIS — Z23 Encounter for immunization: Secondary | ICD-10-CM | POA: Diagnosis not present

## 2017-06-18 DIAGNOSIS — Q211 Atrial septal defect: Secondary | ICD-10-CM

## 2017-06-18 DIAGNOSIS — I10 Essential (primary) hypertension: Secondary | ICD-10-CM | POA: Diagnosis not present

## 2017-06-18 DIAGNOSIS — G4733 Obstructive sleep apnea (adult) (pediatric): Secondary | ICD-10-CM

## 2017-06-18 DIAGNOSIS — Z860100 Personal history of colon polyps, unspecified: Secondary | ICD-10-CM

## 2017-06-18 DIAGNOSIS — E785 Hyperlipidemia, unspecified: Secondary | ICD-10-CM

## 2017-06-18 DIAGNOSIS — Q2112 Patent foramen ovale: Secondary | ICD-10-CM

## 2017-06-18 MED ORDER — LISINOPRIL 10 MG PO TABS: 10.0000 mg | ORAL_TABLET | Freq: Every day | ORAL | 3 refills | Status: DC

## 2017-06-18 MED ORDER — ROSUVASTATIN CALCIUM 20 MG PO TABS: 20.0000 mg | ORAL_TABLET | Freq: Every day | ORAL | 3 refills | Status: DC

## 2017-06-18 NOTE — Progress Notes (Signed)
   Subjective:    Patient ID: Chris Skinner, male    DOB: [birth year], 51 y.o.   MRN: [id number]  HPI He is here for an interval evaluation. He does have a history of hypertension and is presently on lisinopril. He is also taking Crestor for his hyperlipidemia. He is doing well on the 2 medications. He also has a history of colonic polyps and will need another colonoscopy in 2020. He also has a previous history of murmur and subsequently found out to have a PFO. He has no chest pain, shortness breath, PND or DOE. No syncopal episodes. 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 His home life is going well. Family and social history as well as health maintenance immunizations were reviewed He also has underlying OSA and has been using CPAP with good results but has not had a readout.  Review of Systems     Objective:   Physical Exam Alert and in no distress. Tympanic membranes and canals are normal. Pharyngeal area is normal. Neck is supple without adenopathy or thyromegaly. Cardiac exam shows a regular sinus rhythm with a grade 3-4 systolic murmur no gallops. Lungs are clear to auscultation.        Assessment & Plan:  Essential hypertension, benign - Plan: CBC with Differential/Platelet, Comprehensive metabolic panel  OSA (obstructive sleep apnea)  Hyperlipidemia, unspecified hyperlipidemia type - Plan: Lipid panel  History of colonic polyps  PFO (patent foramen ovale) - Plan: EKG 12-Lead, ECHOCARDIOGRAM COMPLETE  Recommend he get a readout concerning his sleep apnea. I will recheck his blood pressure in one month as he is elevated today. Discussed exercise and salt restriction.

## 2017-06-18 NOTE — Telephone Encounter (Signed)
Pt is scheduled for an ECHOCARDIOGRAM on Thursday October 11th @ 2:00pm at [facility] at [facility]. Pt is aware of appt  Approved [id number] Valid- today 06/18/17- 07/17/17

## 2017-06-20 LAB — CBC WITH DIFFERENTIAL/PLATELET
BASOS PCT: 0.4 %
Basophils Absolute: 44 cells/uL (ref 0–200)
EOS ABS: 599 {cells}/uL — AB (ref 15–500)
Eosinophils Relative: 5.4 %
HCT: 45.2 % (ref 38.5–50.0)
Hemoglobin: 15.6 g/dL (ref 13.2–17.1)
Lymphs Abs: 2131 cells/uL (ref 850–3900)
MCH: 31.1 pg (ref 27.0–33.0)
MCHC: 34.5 g/dL (ref 32.0–36.0)
MCV: 90 fL (ref 80.0–100.0)
MONOS PCT: 7.1 %
MPV: 9.7 fL (ref 7.5–12.5)
Neutro Abs: 7537 cells/uL (ref 1500–7800)
Neutrophils Relative %: 67.9 %
PLATELETS: 357 10*3/uL (ref 140–400)
RBC: 5.02 10*6/uL (ref 4.20–5.80)
RDW: 11.8 % (ref 11.0–15.0)
TOTAL LYMPHOCYTE: 19.2 %
WBC mixed population: 788 cells/uL (ref 200–950)
WBC: 11.1 10*3/uL — AB (ref 3.8–10.8)

## 2017-06-20 LAB — LIPID PANEL
CHOLESTEROL: 190 mg/dL (ref <200)
HDL: 42 mg/dL (ref >40)
LDL Cholesterol (Calc): 120 mg/dL (calc) — ABNORMAL HIGH
Non-HDL Cholesterol (Calc): 148 mg/dL (calc) — ABNORMAL HIGH (ref <130)
Total CHOL/HDL Ratio: 4.5 (calc) (ref <5.0)
Triglycerides: 161 mg/dL — ABNORMAL HIGH (ref <150)

## 2017-06-20 LAB — COMPREHENSIVE METABOLIC PANEL
AG Ratio: 2 (calc) (ref 1.0–2.5)
ALBUMIN MSPROF: 4.3 g/dL (ref 3.6–5.1)
ALKALINE PHOSPHATASE (APISO): 47 U/L (ref 40–115)
ALT: 29 U/L (ref 9–46)
AST: 20 U/L (ref 10–35)
BILIRUBIN TOTAL: 0.8 mg/dL (ref 0.2–1.2)
BUN: 17 mg/dL (ref 7–25)
CALCIUM: 9.4 mg/dL (ref 8.6–10.3)
CHLORIDE: 106 mmol/L (ref 98–110)
CO2: 23 mmol/L (ref 20–32)
Creat: 0.84 mg/dL (ref 0.70–1.33)
GLOBULIN: 2.2 g/dL (ref 1.9–3.7)
Glucose, Bld: 126 mg/dL — ABNORMAL HIGH (ref 65–99)
POTASSIUM: 3.9 mmol/L (ref 3.5–5.3)
Sodium: 137 mmol/L (ref 135–146)
Total Protein: 6.5 g/dL (ref 6.1–8.1)

## 2017-06-20 LAB — TEST AUTHORIZATION

## 2017-06-20 LAB — HEMOGLOBIN A1C W/OUT EAG: Hgb A1c MFr Bld: 5.3 % of total Hgb (ref <5.7)

## 2017-06-20 LAB — HEPATITIS C ANTIBODY
HEP C AB: NONREACTIVE
SIGNAL TO CUT-OFF: 0.02 (ref <1.00)

## 2017-06-20 NOTE — Addendum Note (Signed)
Addended by: Minette Headland A on: 06/20/2017 10:39 AM   Modules accepted: Orders

## 2017-06-21 ENCOUNTER — Other Ambulatory Visit: Payer: Self-pay

## 2017-06-21 ENCOUNTER — Ambulatory Visit (HOSPITAL_COMMUNITY): Payer: BC Managed Care – PPO | Attending: Cardiovascular Disease

## 2017-06-21 ENCOUNTER — Other Ambulatory Visit (HOSPITAL_COMMUNITY): Payer: BC Managed Care – PPO

## 2017-06-21 DIAGNOSIS — Q211 Atrial septal defect: Secondary | ICD-10-CM | POA: Diagnosis not present

## 2017-06-21 DIAGNOSIS — Q2112 Patent foramen ovale: Secondary | ICD-10-CM

## 2017-06-21 DIAGNOSIS — I352 Nonrheumatic aortic (valve) stenosis with insufficiency: Secondary | ICD-10-CM | POA: Insufficient documentation

## 2017-06-26 ENCOUNTER — Telehealth: Payer: Self-pay | Admitting: Cardiovascular Disease

## 2017-06-26 NOTE — Telephone Encounter (Signed)
Called Dr. Lanice Shirts office @ [phone] to notify staff to get a message to him that Dr. Oval Linsey is currently out of office but a message will be sent to her to see if she can follow up with him. Also advised that he can send Dr. Oval Linsey a message via EPIC.   Call is in regard to echo that was read by Dr. Oval Linsey - patient is not a currently established cardiology patient

## 2017-06-26 NOTE — Telephone Encounter (Signed)
New message      Dr Redmond School would like to speak with Dr Oval Linsey about the Echo that the pt had please call.

## 2017-06-26 NOTE — Telephone Encounter (Signed)
Attempted to return call at # listed. This phone # is disconnected.

## 2017-06-26 NOTE — Progress Notes (Unsigned)
The echo does show severe thickening of the aortic valve. This was discussed with Dr. Ellouise Newer. He will be referred there for further consultation concerning this.

## 2017-06-27 NOTE — Telephone Encounter (Signed)
Called Dr. Lanice Shirts office.  He was able to talk with one of my colleagues yesterday.

## 2017-07-26 ENCOUNTER — Ambulatory Visit: Payer: BC Managed Care – PPO | Admitting: Cardiovascular Disease

## 2017-07-26 ENCOUNTER — Encounter: Payer: Self-pay | Admitting: Cardiovascular Disease

## 2017-07-26 VITALS — BP 144/110 | HR 60 | Ht 73.0 in | Wt 234.0 lb

## 2017-07-26 DIAGNOSIS — Z720 Tobacco use: Secondary | ICD-10-CM

## 2017-07-26 DIAGNOSIS — I350 Nonrheumatic aortic (valve) stenosis: Secondary | ICD-10-CM

## 2017-07-26 DIAGNOSIS — I10 Essential (primary) hypertension: Secondary | ICD-10-CM | POA: Diagnosis not present

## 2017-07-26 DIAGNOSIS — G4733 Obstructive sleep apnea (adult) (pediatric): Secondary | ICD-10-CM

## 2017-07-26 DIAGNOSIS — E785 Hyperlipidemia, unspecified: Secondary | ICD-10-CM

## 2017-07-26 DIAGNOSIS — Z79899 Other long term (current) drug therapy: Secondary | ICD-10-CM

## 2017-07-26 MED ORDER — LISINOPRIL 20 MG PO TABS: 20.0000 mg | ORAL_TABLET | Freq: Every day | ORAL | 3 refills | Status: DC

## 2017-07-26 MED ORDER — ROSUVASTATIN CALCIUM 40 MG PO TABS: 40.0000 mg | ORAL_TABLET | Freq: Every day | ORAL | 3 refills | Status: DC

## 2017-07-26 NOTE — Patient Instructions (Signed)
Medication Instructions:  INCREASE lisinopril to 20 mg daily ---take 15 mg daily x 1 week, then increase to 20 mg daily   INCREASE rosuvastatin (Crestor) to 40 mg daily  Labwork: Please return for labs in 2 weeks (BMET)  Please return for FASTING labs in 3 months (CMET,Lipid)  Our in office lab hours are Monday-Friday 8:00-4:30, closed for lunch 1-2 pm.  No appointment needed.  Follow-Up: Your physician recommends that you schedule a follow-up appointment in: 3 months with Dr. Claiborne Billings.   Any Other Special Instructions Will Be Listed Below (If Applicable).  Please obtain CPAP download to see if changes need to be made   If you need a refill on your cardiac medications before your next appointment, please call your pharmacy.

## 2017-07-26 NOTE — Progress Notes (Signed)
Cardiology Office Note    Date:  08/03/2017   ID:  Chris Skinner, DOB 03/20/1966, MRN [id number]  PCP:  Chris Lung, MD  Cardiologist:  Chris Majestic, MD   Chief Complaint  Patient presents with  . Follow-up    NP   New cardiology consultation, referred through the courtesy of Dr. Jill Skinner History of Present Illness:  Chris Skinner is a 51 y.o. male who is referred for evaluation of aortic stenosis through the courtesy of Dr. Jill Skinner.  Chris Skinner has a history of hypertension, hyperlipidemia, and obstructive sleep apnea.  He has a history of tobacco use and currently smokes less than a pack per day but has been smoking for over 35 years.  He had quit smoking for year and a half, but then unfortunately resumed.  He is a Psychiatrist.  He was recently evaluated by Chris Skinner and was referred for follow-up echo Doppler study due to.  It is cardiac murmur.  Of note, in January 2015.  He had undergone an echo Doppler study which showed normal systolic function with an EF of 60-65%.  There was a suggestion of a small patent foramen ovale.  He had a mean aortic gradient of 10 and a peak gradient of 18 mm, suggesting very mild aortic stenosis.  At that time.  He underwent a repeat echo Doppler study on 06/21/2017, which continue to show normal LV function with an EF of 55-60%.  However, the aortic valve.  Functionally bicuspid and was calcified with mobility restriction.  He was felt to have mild to moderate aortic stenosis with a mean gradient of 22 and his peak gradient had increased to 46 mm.  There was no mention of a PFO.  There was mild TR.  Patient also has a history of obstructive sleep apnea and in July 2017.  He had undergone a sleep study which I personally reviewed today.  This revealed moderate sleep apnea with an AHI of 27.9 per hour but his RDI was 42.  He had severe sleep apnea, doing rems sleep with an AHI of 56.7 per hour.  Oxygen nadir was 87%.  He  has been on CPAP therapy.  At times, she admits to being tired.  He has not had a recent download.     He also has a history of hyperlipidemia and has been on rosuvastatin 20 mg.  2 years ago, total cholesterol was 226.  Lab work one month ago showed total cholesterol 190, triglycerides 161, LDL cholesterol was 120.  Non-HDL cholesterol was 148.  He presents for evaluation.   Past Medical History:  Diagnosis Date  . Chest pain 2001   [place], normal cardiac workup  . Dyslipidemia   . H/O echocardiogram 1/15   PFO found incidentally.  Echo done due to murmur  . Hyperlipidemia   . Hypertension   . Personal history of colonic polyp - adenoma 01/05/2014  . Tobacco use disorder   . Wears glasses     Past Surgical History:  Procedure Laterality Date  . KNEE ARTHROSCOPY Left 1985  . TONSILLECTOMY AND ADENOIDECTOMY  1974    Current Medications: Outpatient Medications Prior to Visit  Medication Sig Dispense Refill  . lisinopril (PRINIVIL,ZESTRIL) 10 MG tablet Take 1 tablet (10 mg total) by mouth daily. 90 tablet 3  . rosuvastatin (CRESTOR) 20 MG tablet Take 1 tablet (20 mg total) by mouth daily. 90 tablet 3   No facility-administered medications prior  to visit.      Allergies:   Patient has no known allergies.   Social History   Socioeconomic History  . Marital status: Married    Spouse name: None  . Number of children: None  . Years of education: None  . Highest education level: None  Social Needs  . Financial resource strain: None  . Food insecurity - worry: None  . Food insecurity - inability: None  . Transportation needs - medical: None  . Transportation needs - non-medical: None  Occupational History  . 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 Smoking status: Current Every Day Smoker    Types: Cigarettes    Last attempt to quit: 09/22/2013    Years since quitting: 3.8  . Smokeless tobacco:  Never Used  Substance and Sexual Activity  . Alcohol use: Yes    Alcohol/week: 6.0 oz    Types: 10 Cans of beer per week  . Drug use: No  . 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 None  Social History Narrative   Exercise - calisthenics, walking in afternoons 3-5 days per week, single, 2 children     Family History:  The patient's  family history includes Arthritis in his mother; Crohn's disease in his sister; Diabetes in his father and paternal grandfather; Heart disease in his father and mother; Hyperlipidemia in his mother; Stroke in his mother.   ROS General: Negative; No fevers, chills, or night sweats;  HEENT: Negative; No changes in vision or hearing, sinus congestion, difficulty swallowing Pulmonary: Negative; No cough, wheezing, shortness of breath, hemoptysis Cardiovascular: Negative; No chest pain, presyncope, syncope, palpitations GI: Negative; No nausea, vomiting, diarrhea, or abdominal pain GU: Negative; No dysuria, hematuria, or difficulty voiding Musculoskeletal: Negative; no myalgias, joint pain, or weakness Hematologic/Oncology: Negative; no easy bruising, bleeding Endocrine: Negative; no heat/cold intolerance; no diabetes Neuro: Negative; no changes in balance, headaches Skin: Negative; No rashes or skin lesions Psychiatric: Negative; No behavioral problems, depression Sleep: Negative; No snoring, daytime sleepiness, hypersomnolence, bruxism, restless legs, hypnogognic hallucinations, no cataplexy Other comprehensive 14 point system review is negative.   PHYSICAL EXAM:   VS:  BP (!) 144/110   Pulse 60   Ht '6\' 1"'$  (1.854 m)   Wt 234 lb (106.1 kg)   BMI 30.87 kg/m     Repeat blood pressure by me was 160/100.  Wt Readings from Last 3 Encounters:  07/26/17 234 lb (106.1 kg)  06/18/17 231 lb (104.8 kg)  03/27/16 230 lb (104.3 kg)    General: Alert,  oriented, no distress.  Skin: normal turgor, no rashes, warm and dry HEENT: Normocephalic, atraumatic. Pupils equal round and reactive to light; sclera anicteric; extraocular muscles intact; Fundi mild arterial narrowing.  Disc flat.  No hemorrhages or exudates Nose without nasal septal hypertrophy Mouth/Parynx benign; Mallinpatti scale 3 Neck: No JVD, no carotid bruits; normal carotid upstroke Lungs: clear to ausculatation and percussion; no wheezing or rales Chest wall: without tenderness to palpitation Heart: PMI not displaced, RRR, s1 s2 normal, 2/6 mid peaking systolic murmur, no diastolic murmur, no rubs, gallops, thrills, or heaves Abdomen: soft, nontender; no hepatosplenomehaly, BS+; abdominal aorta nontender and not dilated by palpation. Back: no CVA tenderness Pulses 2+ Musculoskeletal: full range of motion, normal strength, no joint deformities Extremities: no  clubbing cyanosis or edema, Homan's sign negative  Neurologic: grossly nonfocal; Cranial nerves grossly wnl Psychologic: Normal mood and affect   Studies/Labs Reviewed:   EKG:  EKG is ordered today.  ECG (independently read by me): Normal sinus rhythm at 60 bpm.  PR interval 200 ms.  No ST segment changes.  QTc interval 430 ms   Recent Labs: BMP Latest Ref Rng & Units 06/18/2017 11/04/2014 09/09/2013  Glucose 65 - 99 mg/dL 126(H) 102(H) 93  BUN 7 - 25 mg/dL '17 14 11  '$ Creatinine 0.70 - 1.33 mg/dL 0.84 0.80 0.66  BUN/Creat Ratio 6 - 22 (calc) NOT APPLICABLE - -  Sodium 151 - 146 mmol/L 137 135 137  Potassium 3.5 - 5.3 mmol/L 3.9 4.6 4.4  Chloride 98 - 110 mmol/L 106 100 103  CO2 20 - 32 mmol/L '23 25 26  '$ Calcium 8.6 - 10.3 mg/dL 9.4 9.4 9.2     Hepatic Function Latest Ref Rng & Units 06/18/2017 11/04/2014 09/09/2013  Total Protein 6.1 - 8.1 g/dL 6.5 7.2 6.8  Albumin 3.5 - 5.2 g/dL - 4.6 4.5  AST 10 - 35 U/L 20 33 27  ALT 9 - 46 U/L 29 58(H) 41  Alk Phosphatase 39 - 117 U/L - 44 47  Total Bilirubin 0.2 - 1.2 mg/dL  0.8 0.9 0.9    CBC Latest Ref Rng & Units 06/18/2017 11/04/2014 09/09/2013  WBC 3.8 - 10.8 Thousand/uL 11.1(H) 9.7 9.9  Hemoglobin 13.2 - 17.1 g/dL 15.6 15.9 16.7  Hematocrit 38.5 - 50.0 % 45.2 45.4 46.5  Platelets 140 - 400 Thousand/uL 357 338 308   Lab Results  Component Value Date   MCV 90.0 06/18/2017   MCV 90.8 11/04/2014   MCV 89.8 09/09/2013   Lab Results  Component Value Date   TSH 0.941 11/04/2014   Lab Results  Component Value Date   HGBA1C 5.3 06/18/2017     BNP No results found for: BNP  ProBNP No results found for: PROBNP   Lipid Panel     Component Value Date/Time   CHOL 190 06/18/2017 1312   TRIG 161 (H) 06/18/2017 1312   HDL 42 06/18/2017 1312   CHOLHDL 4.5 06/18/2017 1312   VLDL 34 11/04/2014 0958   LDLCALC 146 (H) 11/04/2014 0958     RADIOLOGY: No results found.   Additional studies/ records that were reviewed today include:  I reviewed the patient's prior echo Doppler study from 09/29/2013 and is most recent study of 06/21/2017.  I also personally reviewed his diagnostic polysomnogram from 03/27/2016 and recent laboratory and records from Chris Skinner.    ASSESSMENT:    1. Aortic valve stenosis, etiology of cardiac valve disease unspecified   2. Essential hypertension; stage II hypertension   3. Hyperlipidemia, unspecified hyperlipidemia type   4. Medication management   5. Tobacco abuse   6. OSA (obstructive sleep apnea)      PLAN:  Mr. Juelz shipping is a 51 year old gentleman who has a history of hypertension for at least 5 years and a history of hyperlipidemia for at least 7 years.  He has been maintained on lisinopril 10 mg and rosuvastatin 20 mg.  On exam today he presents with stage II hypertension and when I review the record of his recent evaluation with Chris Skinner.  His blood pressure was significantly elevated.  During that evaluation as well with a blood pressure at 140/110 at that time.  I discussed with him the more recent  hypertensive guidelines and particularly for  every 20/10, increased from 115/75.  There is a doubling of cardiovascular risk.  1.  Hypertension, now begins at a systolic blood pressure of 1:30.  I have recommended further titration of lisinopril such that he will take 15 mg for one week and then increase this to 20 mg.  A be met will be rechecked in 2 weeks to make certain he is tolerating this from a renal standpoint.  His lipid studies also are still elevated despite taking rosuvastatin 20 mg and I discussed with him recent data regarding subclinical atherosclerosis.  With his long-standing tobacco history.  I have recommended more aggressive lipid management with target LDL less than 70.  I reviewed his echo Doppler assessments and he does have at least moderate aortic stenosis.  On his most recent echo Doppler study in over the past 3 years.  His mean gradient has increased to 22 and peak gradient to 46 mm from a mean gradient of 10 and peak gradient of 18.  There was no mention of a PFO on his most recent echo.  I also reviewed his CPAP titration study.  I have recommended that a download be obtaineded of his CPAP unit to make certain he is being adequately treated and if not adjustments to CPAP therapy pressure may be necessary  His DME company is apnea and he has a ResMed AirSence 10 CPAP unit.  I discussed the importance of complete smoking cessation.  He will monitor his blood pressure closely and notify me if his blood pressure does not improve.  Otherwise, I will see him in 3 months for follow-up evaluation and further recommendations will be made at that time.   Medication Adjustments/Labs and Tests Ordered: Current medicines are reviewed at length with the patient today.  Concerns regarding medicines are outlined above.  Medication changes, Labs and Tests ordered today are listed in the Patient Instructions below. Patient Instructions  Medication Instructions:  INCREASE lisinopril to 20 mg  daily ---take 15 mg daily x 1 week, then increase to 20 mg daily   INCREASE rosuvastatin (Crestor) to 40 mg daily  Labwork: Please return for labs in 2 weeks (BMET)  Please return for FASTING labs in 3 months (CMET,Lipid)  Our in office lab hours are Monday-Friday 8:00-4:30, closed for lunch 1-2 pm.  No appointment needed.  Follow-Up: Your physician recommends that you schedule a follow-up appointment in: 3 months with Dr. Claiborne Billings.   Any Other Special Instructions Will Be Listed Below (If Applicable).  Please obtain CPAP download to see if changes need to be made   If you need a refill on your cardiac medications before your next appointment, please call your pharmacy.      Signed, Chris Majestic, MD  08/03/2017 4:47 PM    [facility] [address] [place], [place], [place]  [address] Phone: [phone]

## 2017-08-03 ENCOUNTER — Encounter: Payer: Self-pay | Admitting: Cardiovascular Disease

## 2017-08-09 ENCOUNTER — Ambulatory Visit: Payer: BC Managed Care – PPO | Admitting: Cardiovascular Disease

## 2017-08-10 LAB — BASIC METABOLIC PANEL
BUN/Creatinine Ratio: 21 — ABNORMAL HIGH (ref 9–20)
BUN: 17 mg/dL (ref 6–24)
CHLORIDE: 102 mmol/L (ref 96–106)
CO2: 23 mmol/L (ref 20–29)
Calcium: 9.6 mg/dL (ref 8.7–10.2)
Creatinine, Ser: 0.82 mg/dL (ref 0.76–1.27)
GFR calc Af Amer: 117 mL/min/{1.73_m2} (ref >59)
GFR, EST NON AFRICAN AMERICAN: 101 mL/min/{1.73_m2} (ref >59)
Glucose: 91 mg/dL (ref 65–99)
POTASSIUM: 4.4 mmol/L (ref 3.5–5.2)
SODIUM: 140 mmol/L (ref 134–144)

## 2017-10-24 ENCOUNTER — Ambulatory Visit: Payer: BC Managed Care – PPO | Admitting: Cardiovascular Disease

## 2017-10-24 ENCOUNTER — Encounter: Payer: Self-pay | Admitting: Cardiovascular Disease

## 2017-10-24 VITALS — BP 156/88 | HR 61 | Ht 73.0 in | Wt 234.0 lb

## 2017-10-24 DIAGNOSIS — G4733 Obstructive sleep apnea (adult) (pediatric): Secondary | ICD-10-CM

## 2017-10-24 DIAGNOSIS — I10 Essential (primary) hypertension: Secondary | ICD-10-CM | POA: Diagnosis not present

## 2017-10-24 DIAGNOSIS — E785 Hyperlipidemia, unspecified: Secondary | ICD-10-CM

## 2017-10-24 DIAGNOSIS — Z720 Tobacco use: Secondary | ICD-10-CM

## 2017-10-24 DIAGNOSIS — I350 Nonrheumatic aortic (valve) stenosis: Secondary | ICD-10-CM | POA: Diagnosis not present

## 2017-10-24 MED ORDER — LISINOPRIL 30 MG PO TABS: 30.0000 mg | ORAL_TABLET | Freq: Every day | ORAL | 3 refills | Status: DC

## 2017-10-24 NOTE — Progress Notes (Signed)
Cardiology Office Note    Date:  10/26/2017   ID:  Chris Skinner, DOB Aug 01, 1966, MRN [id number]  PCP:  Denita Lung, MD  Cardiologist:  Shelva Majestic, MD   Chief Complaint  Patient presents with  . Follow-up   New cardiology consultation, referred through the courtesy of Dr. [name] History of Present Illness:  Chris Skinner is a 52 y.o. male who is referred for evaluation of aortic stenosis through the courtesy of Dr. [name].  Chris Skinner has a history of hypertension, hyperlipidemia, and obstructive sleep apnea.  He has a history of tobacco use and currently smokes less than a pack per day but has been smoking for over 35 years.  He had quit smoking for year and a half, but then unfortunately resumed.  He is a Psychiatrist.  He was recently evaluated by Dr. Redmond School and was referred for follow-up echo Doppler study due to.  It is cardiac murmur.  Of note, in January 2015.  He had undergone an echo Doppler study which showed normal systolic function with an EF of 60-65%.  There was a suggestion of a small patent foramen ovale.  He had a mean aortic gradient of 10 and a peak gradient of 18 mm, suggesting very mild aortic stenosis.  At that time.  He underwent a repeat echo Doppler study on 06/21/2017, which continue to show normal LV function with an EF of 55-60%.  However, the aortic valve.  Functionally bicuspid and was calcified with mobility restriction.  He was felt to have mild to moderate aortic stenosis with a mean gradient of 22 and his peak gradient had increased to 46 mm.  There was no mention of a PFO.  There was mild TR.  Patient also has a history of obstructive sleep apnea and in July 2017.  He had undergone a sleep study which I personally reviewed today.  This revealed moderate sleep apnea with an AHI of 27.9 per hour but his RDI was 42.  He had severe sleep apnea, doing rems sleep with an AHI of 56.7 per hour.  Oxygen nadir was 87%.  He has been  on CPAP therapy.  At times, she admits to being tired.  He has not had a recent download.  His DME company is [employer].  He  has a history of hyperlipidemia and has been on rosuvastatin 20 mg.  2 years ago, total cholesterol was 226.  Lab work in October 2018: total cholesterol 190, triglycerides 161, LDL cholesterol was 120.  Non-HDL cholesterol was 148.    When I initially saw him, his blood pressure was elevated and on repeat by me was 160/100.  Discussed with him the new hypertensive guidelines.  I recommended further titration of lisinopril from 10 mg up to 15 mg for one week and then up to 20 mg daily.  I also recommended more aggressive lipid therapy with his elevated LDL.  I discussed the importance of smoking cessation and the need for follow-up of his aortic stenosis.  Over the past several months, he has continued to be asymptomatic.  He exercises regularly and often may walk up to 5-7 miles a day.  Unfortunately, he still smoking cigarettes.  He had quit for short duration in his early 31s but again resumed.  He denies chest pain, PND, orthopnea.  He denies presyncope or syncope.  He denies palpitations.   Past Medical History:  Diagnosis Date  . Chest pain 2001  [place], normal cardiac workup  . Dyslipidemia   . H/O echocardiogram 1/15   PFO found incidentally.  Echo done due to murmur  . Hyperlipidemia   . Hypertension   . Personal history of colonic polyp - adenoma 01/05/2014  . Tobacco use disorder   . Wears glasses     Past Surgical History:  Procedure Laterality Date  . KNEE ARTHROSCOPY Left 1985  . TONSILLECTOMY AND ADENOIDECTOMY  1974    Current Medications: Outpatient Medications Prior to Visit  Medication Sig Dispense Refill  . rosuvastatin (CRESTOR) 40 MG tablet Take 1 tablet (40 mg total) daily by mouth. 90 tablet 3  . lisinopril (PRINIVIL,ZESTRIL) 20 MG tablet Take 1 tablet (20 mg total) daily by mouth. 90 tablet 3   No facility-administered medications prior  to visit.      Allergies:   Patient has no known allergies.   Social History   Socioeconomic History  . Marital status: Married    Spouse name: None  . Number of children: None  . Years of education: None  . Highest education level: None  Social Needs  . Financial resource strain: None  . Food insecurity - worry: None  . Food insecurity - inability: None  . Transportation needs - medical: None  . Transportation needs - non-medical: None  Occupational History  . Occupation: high school PE Product manager: [place]  Tobacco Use  . Smoking status: Current Every Day Smoker    Last attempt to quit: 09/22/2013    Years since quitting: 4.0  . Smokeless tobacco: Former Systems developer    Types: [place] date: 2009  Substance and Sexual Activity  . Alcohol use: Yes    Alcohol/week: 6.0 oz    Types: 10 Cans of beer per week  . Drug use: No  . Sexual activity: None    Comment: teach PE in high school, coaches football, exercises - walks 5 days per week; separated  Other Topics Concern  . None  Social History Narrative   Exercise - calisthenics, walking in afternoons 3-5 days per week, single, 2 children     Family History:  The patient's  family history includes Arthritis in his mother; Crohn's disease in his sister; Diabetes in his father and paternal grandfather; Heart disease in his father and mother; Hyperlipidemia in his mother; Stroke in his mother.   ROS General: Negative; No fevers, chills, or night sweats;  HEENT: Negative; No changes in vision or hearing, sinus congestion, difficulty swallowing Pulmonary: Negative; No cough, wheezing, shortness of breath, hemoptysis Cardiovascular: Negative; No chest pain, presyncope, syncope, palpitations GI: Negative; No nausea, vomiting, diarrhea, or abdominal pain GU: Negative; No dysuria, hematuria, or difficulty voiding Musculoskeletal: Negative; no myalgias, joint pain, or  weakness Hematologic/Oncology: Negative; no easy bruising, bleeding Endocrine: Negative; no heat/cold intolerance; no diabetes Neuro: Negative; no changes in balance, headaches Skin: Negative; No rashes or skin lesions Psychiatric: Negative; No behavioral problems, depression Sleep: Negative; No snoring, daytime sleepiness, hypersomnolence, bruxism, restless legs, hypnogognic hallucinations, no cataplexy Other comprehensive 14 point system review is negative.   PHYSICAL EXAM:   VS:  BP (!) 156/88   Pulse 61   Ht _0  (1.854 m)   Wt 234 lb (106.1 kg)   BMI 30.87 kg/m     Repeat blood pressure by me was repeat blood pressure was 146/88  Wt Readings from Last 3 Encounters:  10/24/17 234 lb (106.1 kg)  07/26/17 234 lb (106.1 kg)  06/18/17 231 lb (104.8 kg)    General: Alert, oriented, no distress.  Skin: normal turgor, no rashes, warm and dry HEENT: Normocephalic, atraumatic. Pupils equal round and reactive to light; sclera anicteric; extraocular muscles intact;  Nose without nasal septal hypertrophy Mouth/Parynx benign; Mallinpatti scale3 Neck: No JVD, no carotid bruits; normal carotid upstroke Lungs: clear to ausculatation and percussion; no wheezing or rales Chest wall: without tenderness to palpitation Heart: PMI not displaced, RRR, s1 s2 normal, 2/6 mid peaking systolic murmur, no diastolic murmur, no rubs, gallops, thrills, or heaves Abdomen: soft, nontender; no hepatosplenomehaly, BS+; abdominal aorta nontender and not dilated by palpation. Back: no CVA tenderness Pulses 2+ Musculoskeletal: full range of motion, normal strength, no joint deformities Extremities: no clubbing cyanosis or edema, Homan's sign negative  Neurologic: grossly nonfocal; Cranial nerves grossly wnl Psychologic: Normal mood and affect   Studies/Labs Reviewed:   EKG:  EKG is ordered today.  ECG (independently read by me): Normal sinus rhythm at 61 bpm.  No ectopy.  Normal intervals.  November  2018 ECG (independently read by me): Normal sinus rhythm at 60 bpm.  PR interval 200 ms.  No ST segment changes.  QTc interval 430 ms   Recent Labs: BMP Latest Ref Rng & Units 08/09/2017 06/18/2017 11/04/2014  Glucose 65 - 99 mg/dL 91 126(H) 102(H)  BUN 6 - 24 mg/dL _0 Creatinine 0.76 - 1.27 mg/dL 0.82 0.84 0.80  BUN/Creat Ratio 9 - 20 20(B) NOT APPLICABLE -  Sodium 559 - 144 mmol/L 140 137 135  Potassium 3.5 - 5.2 mmol/L 4.4 3.9 4.6  Chloride 96 - 106 mmol/L 102 106 100  CO2 20 - 29 mmol/L _1 Calcium 8.7 - 10.2 mg/dL 9.6 9.4 9.4     Hepatic Function Latest Ref Rng & Units 06/18/2017 11/04/2014 09/09/2013  Total Protein 6.1 - 8.1 g/dL 6.5 7.2 6.8  Albumin 3.5 - 5.2 g/dL - 4.6 4.5  AST 10 - 35 U/L 20 33 27  ALT 9 - 46 U/L 29 58(H) 41  Alk Phosphatase 39 - 117 U/L - 44 47  Total Bilirubin 0.2 - 1.2 mg/dL 0.8 0.9 0.9    CBC Latest Ref Rng & Units 06/18/2017 11/04/2014 09/09/2013  WBC 3.8 - 10.8 Thousand/uL 11.1(H) 9.7 9.9  Hemoglobin 13.2 - 17.1 g/dL 15.6 15.9 16.7  Hematocrit 38.5 - 50.0 % 45.2 45.4 46.5  Platelets 140 - 400 Thousand/uL 357 338 308   Lab Results  Component Value Date   MCV 90.0 06/18/2017   MCV 90.8 11/04/2014   MCV 89.8 09/09/2013   Lab Results  Component Value Date   TSH 0.941 11/04/2014   Lab Results  Component Value Date   HGBA1C 5.3 06/18/2017     BNP No results found for: BNP  ProBNP No results found for: PROBNP   Lipid Panel     Component Value Date/Time   CHOL 190 06/18/2017 1312   TRIG 161 (H) 06/18/2017 1312   HDL 42 06/18/2017 1312   CHOLHDL 4.5 06/18/2017 1312   VLDL 34 11/04/2014 0958   LDLCALC 146 (H) 11/04/2014 0958     RADIOLOGY: No results found.   Additional studies/ records that were reviewed today include:  I reviewed the patient's prior echo Doppler study from 09/29/2013 and is most recent study of 06/21/2017.  I also personally reviewed his diagnostic polysomnogram from 03/27/2016 and recent laboratory  and records from Dr. Redmond School.    ASSESSMENT:  1. Aortic valve stenosis, etiology of cardiac valve disease unspecified   2. Essential hypertension; stage II hypertension   3. Hyperlipidemia, unspecified hyperlipidemia type   4. Tobacco abuse   5. OSA (obstructive sleep apnea)      PLAN:  Chris Skinner is a 52 year old gentleman who has a history of hypertension for >5 years and a history of hyperlipidemia for > 7 years.  When I initially saw him, he was on lisinopril 10 mg and rosuvastatin 20 mg. his blood pressure was elevated with stage II hypertension, and I have titrated lisinopril up to its present dose of 20 mg daily.    I reviewed his echo Doppler assessments and he does have at least moderate aortic stenosis.  On his most recent echo Doppler study in over the past 3 years.  His mean gradient has increased to 22 and peak gradient to 46 mm from a mean gradient of 10 and peak gradient of 18.  There was no mention of a PFO on his most recent echo.  I also reviewed his CPAP titration study.  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  I have recommended that a download be obtained to make certain he is on adequate pressure and adjustments do not need to be made.  His blood pressure today is improved but still elevated.  I am titrating lisinopril to 30 mg daily. He is asymptomatic with reference to his aortic stenosis, but his echo Doppler study in 2018 had progressed from his 2015 evaluation.  I will recheck an echo Doppler study in 6 months for follow-up evaluation.  I discussed symptoms associated with development of symptomatic aortic stenosis, including chest tightness, presyncope or syncope, exertional dyspnea or CHF symptomatology.  I again discussed the importance of smoking cessation.  I discussed the importance of more aggressive treatment of hyperlipidemia.  He is now on rosuvastatin 40 mg daily, which had been increased from 20 mg.  I will see him in 6 months  for reevaluation.  Medication Adjustments/Labs and Tests Ordered: Current medicines are reviewed at length with the patient today.  Concerns regarding medicines are outlined above.  Medication changes, Labs and Tests ordered today are listed in the Patient Instructions below. Patient Instructions  Medication Instructions:  INCREASE Lisinopril to 30 mg daily  Testing/Procedures: Your physician has requested that you have an echocardiogram in 6 MONTHS. Echocardiography is a painless test that uses sound waves to create images of your heart. It provides your doctor with information about the size and shape of your heart and how well your heart's chambers and valves are working. This procedure takes approximately one hour. There are no restrictions for this procedure.  This will be done at our [facility] location:  [place] wants you to follow-up in: 6 months with Dr. Claiborne Billings. You will receive a reminder letter in the mail two months in advance. If you don't receive a letter, please call our office to schedule the follow-up appointment.   Any Other Special Instructions Will Be Listed Below (If Applicable).     If you need a refill on your cardiac medications before your next appointment, please call your pharmacy.      Signed, Shelva Majestic, MD  10/26/2017 3:48 PM    [facility] [address] [place], [place], [place]  [address] Phone: [phone]

## 2017-10-24 NOTE — Patient Instructions (Signed)
Medication Instructions:  INCREASE Lisinopril to 30 mg daily  Testing/Procedures: Your physician has requested that you have an echocardiogram in 6 MONTHS. Echocardiography is a painless test that uses sound waves to create images of your heart. It provides your doctor with information about the size and shape of your heart and how well your heart's chambers and valves are working. This procedure takes approximately one hour. There are no restrictions for this procedure.  This will be done at our [facility] location:  [place] wants you to follow-up in: 6 months with Dr. Claiborne Billings. You will receive a reminder letter in the mail two months in advance. If you don't receive a letter, please call our office to schedule the follow-up appointment.   Any Other Special Instructions Will Be Listed Below (If Applicable).     If you need a refill on your cardiac medications before your next appointment, please call your pharmacy.

## 2017-10-26 ENCOUNTER — Encounter: Payer: Self-pay | Admitting: Cardiovascular Disease

## 2017-10-31 ENCOUNTER — Telehealth: Payer: Self-pay

## 2017-10-31 NOTE — Telephone Encounter (Signed)
Dr. Claiborne Billings and pt were discussing at pt at last office visit if C PAP   was working as effectively as hope. And if a follow up is needed. Please advise [facility]

## 2017-11-01 ENCOUNTER — Telehealth: Payer: Self-pay

## 2017-11-01 NOTE — Telephone Encounter (Signed)
Called pt to let him know that he would need to download info from Cpap machine . May be a sims card that can be downloaded if not he will have to take his machine to have it read and printed for Dr. Redmond School. [place]

## 2017-11-01 NOTE — Telephone Encounter (Signed)
He needs to get a read out on his CPAP machine

## 2018-02-25 ENCOUNTER — Ambulatory Visit: Payer: BC Managed Care – PPO | Admitting: Family Medicine

## 2018-02-25 ENCOUNTER — Encounter: Payer: Self-pay | Admitting: Family Medicine

## 2018-02-25 VITALS — BP 140/92 | HR 65 | Temp 98.4°F | Resp 16 | Wt 234.0 lb

## 2018-02-25 DIAGNOSIS — H1032 Unspecified acute conjunctivitis, left eye: Secondary | ICD-10-CM

## 2018-02-25 MED ORDER — ERYTHROMYCIN 5 MG/GM OP OINT: 1.0000 "application " | TOPICAL_OINTMENT | Freq: Three times a day (TID) | OPHTHALMIC | 0 refills | Status: DC

## 2018-02-25 NOTE — Patient Instructions (Signed)

## 2018-02-25 NOTE — Progress Notes (Signed)
Subjective:  Chris Skinner is a 52 y.o. male who presents for a 2 day history of left eye redness, irritation and drainage. States this morning his eye was draining thick colorful drainage and lids were matted together. No known eye injury.   Denies fever, chills, eye pain, headache, dizziness, sore throat, cough, chest pain, shortness of breath.   Treatment to date: eye drops for "pink eye" and cool compresses.   No other aggravating or relieving factors.  No other c/o.  HTN history and reports good medication compliance. He is aware that his BP is elevated today.  He is a smoker.  Exercises.   ROS as in subjective.   Objective: Vitals:   02/25/18 0907  BP: (!) 140/92  Pulse: 65  Resp: 16  Temp: 98.4 F (36.9 C)    General appearance: Alert, WD/WN, no distress, mildly ill appearing                             Skin: warm, no rash                           Head: no sinus tenderness                            Eyes: left upper and lower lid with mild edema, left conjunctiva with erythema, clear drainage, normal EOMs without pain, right conjunctiva normal, corneas clear, PERRLA                            Ears: pearly TMs, external ear canals normal                          Nose: septum midline, turbinates swollen, with erythema and clear discharge             Mouth/throat: MMM, tongue normal, mild pharyngeal erythema                           Neck: supple, no adenopathy, no thyromegaly, nontender                                Assessment: Acute conjunctivitis of left eye, unspecified acute conjunctivitis type - Plan: erythromycin (ROMYCIN) ophthalmic ointment    Plan: Discussed diagnosis and treatment of acute conjunctivitis. Romycin prescribed. Discussed contagious nature of condition. He does not wear contact lenses. Advised that if he notices vision changes, worsening symptoms or pain then he should see his eye doctor.   Suggested cool compresses and good hand hygiene.   Call/return if worsening or in one week if symptoms aren't resolving.  Discussed following up with either his cardiologist or PCP for uncontrolled HTN. Also encouraged him to stop smoking.

## 2018-04-23 ENCOUNTER — Ambulatory Visit (HOSPITAL_COMMUNITY): Payer: BC Managed Care – PPO | Attending: Cardiology

## 2018-04-23 ENCOUNTER — Other Ambulatory Visit: Payer: Self-pay

## 2018-04-23 DIAGNOSIS — E785 Hyperlipidemia, unspecified: Secondary | ICD-10-CM | POA: Insufficient documentation

## 2018-04-23 DIAGNOSIS — I119 Hypertensive heart disease without heart failure: Secondary | ICD-10-CM | POA: Diagnosis not present

## 2018-04-23 DIAGNOSIS — I352 Nonrheumatic aortic (valve) stenosis with insufficiency: Secondary | ICD-10-CM | POA: Diagnosis not present

## 2018-04-23 DIAGNOSIS — I350 Nonrheumatic aortic (valve) stenosis: Secondary | ICD-10-CM | POA: Diagnosis not present

## 2018-08-05 ENCOUNTER — Other Ambulatory Visit: Payer: Self-pay | Admitting: Cardiovascular Disease

## 2018-08-05 DIAGNOSIS — E785 Hyperlipidemia, unspecified: Secondary | ICD-10-CM

## 2018-09-09 ENCOUNTER — Ambulatory Visit: Payer: BC Managed Care – PPO | Admitting: Family Medicine

## 2018-09-09 ENCOUNTER — Encounter: Payer: Self-pay | Admitting: Family Medicine

## 2018-09-09 VITALS — BP 164/100 | HR 76 | Temp 98.0°F | Ht 73.0 in | Wt 229.2 lb

## 2018-09-09 DIAGNOSIS — F17200 Nicotine dependence, unspecified, uncomplicated: Secondary | ICD-10-CM

## 2018-09-09 DIAGNOSIS — I10 Essential (primary) hypertension: Secondary | ICD-10-CM

## 2018-09-09 DIAGNOSIS — J019 Acute sinusitis, unspecified: Secondary | ICD-10-CM

## 2018-09-09 MED ORDER — AMOXICILLIN 500 MG PO TABS: 1000.0000 mg | ORAL_TABLET | Freq: Two times a day (BID) | ORAL | 0 refills | Status: DC

## 2018-09-09 NOTE — Progress Notes (Signed)
Chief Complaint  Patient presents with  . Cough    x 2 weeks. Nasal congestion, right ear pain, green mucus, no fever or chiills/body aches.    Started with sinus congestion, bloody discharge noted in the mornings when he took off CPAP (dries him out).  A week ago the drainage became discolored, yellow, and is now green. Right ear pain started 2 days ago. No hearing changes.  No sinus pain.  +sore throat.  Cough is productive of same discolored drainage as he gets from the nose.  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  +Smoker  Reports BP's have been up (has HTN), Dr. Georgina Peer raised lisinopril at last check  PMH, [place], [place] reviewed  NOT taking Mucinex-D (didn't have to show license)--unsure of which type of mucinex he got. Takes Zyrtec daily  Outpatient Encounter Medications as of 09/09/2018  Medication Sig Note  . lisinopril (PRINIVIL,ZESTRIL) 30 MG tablet Take 1 tablet (30 mg total) by mouth daily.   . pseudoephedrine-guaifenesin (MUCINEX D) 60-600 MG 12 hr tablet Take 1 tablet by mouth every 12 (twelve) hours. 09/09/2018: Is NOT the D version (didn't have to show license), but can't recall which kind he is taking  . rosuvastatin (CRESTOR) 40 MG tablet TAKE 1 TABLET (40 MG TOTAL) DAILY BY MOUTH.   . [DISCONTINUED] erythromycin [facility]) ophthalmic ointment Place 1 application into the right eye 3 (three) times daily.    No facility-administered encounter medications on file as of 09/09/2018.    No Known Allergies  ROS:  URI symptoms per HPI.  No chest pain, shortness of breath, palpitations, bleeding (just nasal, per HPI), rash, edema or other concerns. See HPI  PHYSICAL EXAM:  BP (!) 170/102   Pulse 76   Temp 98 F (36.7 C) (Tympanic)   Ht 6\' 1"  (1.854 m)   Wt 229 lb 3.2 oz (104 kg)   BMI 30.24 kg/m   164/100 on repeat by MD  Well appearing male, in no distress HEENT: PERRL, EOMI, conjunctiva and sclera are  clear. Nasal mucosa mod-severely edematous L>R, with some erythema. TM's retracted bilaterally, no erythema OP is clear. Sinuses are nontender Neck: no lymphadenopathy or mass Heart: 3/6 SEM, loudest at apex (has known AS, last echo done 04/2018, reviewed) Lungs: clear bilaterally Extremities: no edema Skin:  Normal turgor, no rash Psych: normal mood, affect, hygiene and grooming   ASSESSMENT/PLAN:  Acute non-recurrent sinusitis, unspecified location - Plan: amoxicillin (AMOXIL) 500 MG tablet  Smoker - counseled re: risks and encouraged cessation  Hypertension, uncontrolled - avoid decongestants, limit Na in diet. Monitor BP and f/u with PCP or cardiologist if remains elevated    Drink plenty of water. Avoid all decongestants (pseudoephedrine and phenylephrine). Continue guaifenesin (expectorant found in mucinex) Consider using sinus rinses once or twice daily.  Monitor your blood pressure regularly and follow up with your primary or cardiologist if it remains over 140/90. Please continue to work on quitting smoking.  Contact us in 5-6 days if symptoms are worsening rather than improving, or at the end of the course if not completely resolved.

## 2018-09-09 NOTE — Patient Instructions (Addendum)
  Drink plenty of water. Avoid all decongestants (pseudoephedrine and phenylephrine). Continue guaifenesin (expectorant found in mucinex) Consider using sinus rinses once or twice daily.  Monitor your blood pressure regularly and follow up with your primary or cardiologist if it remains over 140/90. Please continue to work on quitting smoking.  Contact us in 5-6 days if symptoms are worsening rather than improving, or at the end of the course if not completely resolved.   Please try and quit smoking--start thinking about why/when you smoke (habit, boredom, stress) in order to come up with effective strategies to cut back or quit. Available resources to help you quit include free counseling through [facility] ([place]Quitline.com or 1-800-QUITNOW), smoking cessation classes through [facility] (call to find out schedule), over-the-counter nicotine replacements, and e-cigarettes (although this may not help break the hand-mouth habit).  Many insurance companies also have smoking cessation programs (which may decrease the cost of patches, meds if enrolled).  If these methods are not effective for you, and you are motivated to quit, return to discuss the possibility of prescription medications.

## 2018-11-10 ENCOUNTER — Other Ambulatory Visit: Payer: Self-pay | Admitting: Cardiovascular Disease

## 2018-11-22 ENCOUNTER — Encounter: Payer: Self-pay | Admitting: Family Medicine

## 2018-11-22 ENCOUNTER — Other Ambulatory Visit: Payer: Self-pay

## 2018-11-22 ENCOUNTER — Ambulatory Visit: Payer: BC Managed Care – PPO | Admitting: Family Medicine

## 2018-11-22 VITALS — BP 140/92 | HR 72 | Temp 98.3°F | Resp 16 | Wt 226.6 lb

## 2018-11-22 DIAGNOSIS — Z8711 Personal history of peptic ulcer disease: Secondary | ICD-10-CM | POA: Diagnosis not present

## 2018-11-22 DIAGNOSIS — R1319 Other dysphagia: Secondary | ICD-10-CM

## 2018-11-22 DIAGNOSIS — R1310 Dysphagia, unspecified: Secondary | ICD-10-CM

## 2018-11-22 NOTE — Patient Instructions (Addendum)
Avoid eating meat or any large bites of food until seen by GI.   Avoid alcohol or NSAIDs.   Try an over the counter antacid such as Nexium once daily for now and see if this helps.   You will receive a call from [place].

## 2018-11-22 NOTE — Progress Notes (Signed)
   Subjective:    Patient ID: [name], male    DOB: Mar 22, 1966, 53 y.o.   MRN: [id number]  HPI Chief Complaint  Patient presents with  . trouble swallowing    trouble swallowing. started last night. more of a gag reflux   Here with complaints of of a 2-day history of food getting stuck in his esophagus.  States he ate steak last night and was able to eventually vomit the meat back up. He still had discomfort after that until approximately noon today when he felt his esophagus "relax".  States he has a history of esophageal and stomach ulcers.  Has not seen GI in many years.  Denies fever, chills, dizziness, chest pain, palpitations, shortness of breath, abdominal pain, N/V/D. Denies blood in his stool.    Review of Systems Pertinent positives and negatives in the history of present illness.     Objective:   Physical Exam BP (!) 140/92   Pulse 72   Temp 98.3 F (36.8 C) (Oral)   Resp 16   Wt 226 lb 9.6 oz (102.8 kg)   SpO2 98%   BMI 29.90 kg/m   Alert and in no distress.  Pharyngeal area is normal. Neck is supple without adenopathy or thyromegaly. Cardiac exam shows a regular rate and rhythm. Lungs are clear to auscultation. Skin is warm and dry.       Assessment & Plan:  Esophageal dysphagia - Plan: Ambulatory referral to Gastroenterology  History of peptic ulcer - Plan: Ambulatory referral to Gastroenterology  Exam unremarkable. Vitals WNL. Reports feeling back to baseline. Referral to GI for further evaluation of presumed food impactions. Advised to eat small bites and avoid steak or other meats difficult to digest. Avoid alcohol due to history of ulcer.

## 2018-12-05 ENCOUNTER — Other Ambulatory Visit: Payer: Self-pay | Admitting: Cardiovascular Disease

## 2018-12-24 ENCOUNTER — Ambulatory Visit: Payer: BC Managed Care – PPO | Admitting: Internal Medicine

## 2019-01-13 ENCOUNTER — Encounter: Payer: Self-pay | Admitting: Internal Medicine

## 2019-01-27 ENCOUNTER — Other Ambulatory Visit: Payer: Self-pay | Admitting: Cardiovascular Disease

## 2019-01-27 DIAGNOSIS — E785 Hyperlipidemia, unspecified: Secondary | ICD-10-CM

## 2019-01-27 NOTE — Telephone Encounter (Signed)
Rosuvastatin refilled.

## 2019-01-29 ENCOUNTER — Encounter: Payer: Self-pay | Admitting: Internal Medicine

## 2019-02-26 ENCOUNTER — Other Ambulatory Visit: Payer: Self-pay | Admitting: Cardiovascular Disease

## 2019-03-31 ENCOUNTER — Other Ambulatory Visit: Payer: Self-pay | Admitting: Cardiovascular Disease

## 2019-04-22 NOTE — Progress Notes (Signed)
error 

## 2019-04-23 ENCOUNTER — Telehealth: Payer: Self-pay | Admitting: *Deleted

## 2019-04-23 ENCOUNTER — Ambulatory Visit: Payer: BC Managed Care – PPO | Admitting: Cardiology

## 2019-04-23 ENCOUNTER — Other Ambulatory Visit: Payer: Self-pay

## 2019-04-23 ENCOUNTER — Encounter: Payer: Self-pay | Admitting: Cardiology

## 2019-04-23 VITALS — BP 217/119 | HR 63 | Ht 73.0 in | Wt 237.0 lb

## 2019-04-23 DIAGNOSIS — I350 Nonrheumatic aortic (valve) stenosis: Secondary | ICD-10-CM | POA: Diagnosis not present

## 2019-04-23 DIAGNOSIS — Z01818 Encounter for other preprocedural examination: Secondary | ICD-10-CM | POA: Insufficient documentation

## 2019-04-23 DIAGNOSIS — Z79899 Other long term (current) drug therapy: Secondary | ICD-10-CM

## 2019-04-23 DIAGNOSIS — G4730 Sleep apnea, unspecified: Secondary | ICD-10-CM

## 2019-04-23 DIAGNOSIS — E785 Hyperlipidemia, unspecified: Secondary | ICD-10-CM

## 2019-04-23 DIAGNOSIS — I10 Essential (primary) hypertension: Secondary | ICD-10-CM | POA: Diagnosis not present

## 2019-04-23 MED ORDER — AMLODIPINE BESYLATE 5 MG PO TABS: 5.0000 mg | ORAL_TABLET | Freq: Every day | ORAL | 6 refills | Status: DC

## 2019-04-23 MED ORDER — LISINOPRIL-HYDROCHLOROTHIAZIDE 20-12.5 MG PO TABS: 1.0000 | ORAL_TABLET | Freq: Every day | ORAL | 6 refills | Status: DC

## 2019-04-23 NOTE — Assessment & Plan Note (Signed)
Moderate AS- last echo Aug 2019 showed slight increase in his gradients from Oct 2018

## 2019-04-23 NOTE — Patient Instructions (Addendum)
Medication Instructions:  STOP- Lisinopril 30 mg START- Lisinopirl/HCTZ 20/12.5 mg by mouth daily START- Amlodipine 5 mg by mouth daily  If you need a refill on your cardiac medications before your next appointment, please call your pharmacy.  Labwork: BMP in 2 week HERE IN OUR OFFICE AT [facility]  You will NOT need to fast   Take the provided lab slips with you to the lab for your blood draw.   When you have your labs (blood work) drawn today and your tests are completely normal, you will receive your results only by MyChart Message (if you have MyChart) -OR-  A paper copy in the mail.  If you have any lab test that is abnormal or we need to change your treatment, we will call you to review these results.  Testing/Procedures: None Ordered   Follow-Up: . Your physician recommends that you schedule a follow-up appointment in: 2 Weeks with [place], you and your health needs are our priority.  As part of our continuing mission to provide you with exceptional heart care, we have created designated Provider Care Teams.  These Care Teams include your primary Cardiologist (physician) and Advanced Practice Providers (APPs -  Physician Assistants and Nurse Practitioners) who all work together to provide you with the care you need, when you need it.  Thank you for choosing [facility] at [facility]!!

## 2019-04-23 NOTE — Assessment & Plan Note (Signed)
On C-pap, compliant

## 2019-04-23 NOTE — Telephone Encounter (Signed)
   [facility] Pre-operative Risk Assessment    Request for surgical clearance:  1. What type of surgery is being performed? Dental extraction   2. When is this surgery scheduled? 04/24/2019   3. What type of clearance is required (medical clearance vs. Pharmacy clearance to hold med vs. Both)? Medical  4. Are there any medications that need to be held prior to surgery and how long?None    5. Practice name and name of physician performing surgery? [facility]   6. What is your office phone number [phone]    7.   What is your office fax number [phone]  8.   Anesthesia type (None, local, MAC, general) ? Sedation   Barbaraann Barthel 04/23/2019, 10:22 AM  _________________________________________________________________   (provider comments below)

## 2019-04-23 NOTE — Assessment & Plan Note (Signed)
Repeat B/P by me 184/ 104-

## 2019-04-23 NOTE — Telephone Encounter (Signed)
   Primary Cardiologist: Shelva Majestic, MD  Chart reviewed as part of pre-operative protocol coverage. Patient was contacted 04/23/2019 in reference to pre-operative risk assessment for pending surgery as outlined below.  Chris Skinner was last seen on 04/23/2019 by [name] PA.    Due to uncontrolled B/P, Chris Skinner will require a follow-up visit for further pre-operative risk assessment. Clearance to be provided then.  He will not require SBE prophylaxis for this procedure.   Pre-op covering staff: - Please schedule appointment and call patient to inform them. - Please contact requesting surgeon's office via preferred method (i.e, phone, fax) to inform them of need for appointment prior to surgery.  [name], PA-C 04/23/2019, 11:09 AM

## 2019-04-23 NOTE — Assessment & Plan Note (Signed)
Will check lipids when I check his echo ( his last LDL 122-Oct 2019)

## 2019-04-23 NOTE — Assessment & Plan Note (Signed)
Will hold on clearance until B/P is controlled.  When cleared there will be no need for SBE prophylaxis based on guidelines.

## 2019-04-23 NOTE — Progress Notes (Signed)
Cardiology Office Note:    Date:  04/23/2019   ID:  Chris Skinner, DOB [birth year], MRN [id number]  PCP:  Chris Lung, MD  Cardiologist:  Dr Chris Skinner  Electrophysiologist:  None   Referring MD: Chris Lung, MD   Pre op clearance  History of Present Illness:    Chris Skinner is a 53 y.o. male with a hx of moderate aortic stenosis, hypertension, dyslipidemia, and sleep apnea on CPAP. 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  His last echo Aug 2019 showed a slight increase in his gradients but still "moderste AS".    He is in the office today for pre op clearance prior to wisdom teeth removal.  His B/P when he checked in was 217/119.  He denies chest pain or unusual dyspnea.  He notes his B/P has been elevated at recent office visits but he does not regularly follow his B/P at home.  He is compliant with his medications, diet, and C-pap.    Past Medical History:  Diagnosis Date  . Chest pain 2001   [facility], normal cardiac workup  . Dyslipidemia   . H/O echocardiogram 1/15   PFO found incidentally.  Echo done due to murmur  . Hyperlipidemia   . Hypertension   . Personal history of colonic polyp - adenoma 01/05/2014  . Tobacco use disorder   . Wears glasses     Past Surgical History:  Procedure Laterality Date  . COLONOSCOPY  2015  . KNEE ARTHROSCOPY Left 1985  . TONSILLECTOMY AND ADENOIDECTOMY  1974    Current Medications: Current Meds  Medication Sig  . rosuvastatin (CRESTOR) 40 MG tablet TAKE 1 TABLET (40 MG TOTAL) DAILY BY MOUTH.  . [DISCONTINUED] lisinopril (ZESTRIL) 30 MG tablet Take 1 tablet (30 mg total) by mouth daily. SCHEDULE OV FOR FURTHER REFILLS . 2 ND ATTEMPT.     Allergies:   Patient has no known allergies.   Social History   Socioeconomic History  . Marital status: Married    Spouse name: Not on file  . Number of children: Not on file  . Years of education: Not on file  . Highest education level: Not on file  Occupational History  . 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 Financial resource strain: Not on file  . Food insecurity    Worry: Not on file    Inability: Not on file  . Transportation needs    Medical: Not on file    Non-medical: Not on file  Tobacco Use  . Smoking status: Current Every Day Smoker    Last attempt to quit: 09/22/2013    Years since quitting: 5.5  . Smokeless tobacco: Former Systems developer    Types: [place] date: 2009  Substance and Sexual Activity  . Alcohol use: Yes    Alcohol/week: 10.0 standard drinks    Types: 10 Cans of beer per week  . Drug use: No  . 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 Physical activity    Days per week: Not on file    Minutes per session: Not on file  . Stress: Not on file  Relationships  . Social Herbalist on phone: Not on file    Gets together: Not  on file    Attends religious service: Not on file    Active member of club or organization: Not on file    Attends meetings of clubs or organizations: Not on file    Relationship status: Not on file  Other Topics Concern  . Not on file  Social History Narrative   Exercise - calisthenics, walking in afternoons 3-5 days per week, single, 2 children     Family History: The patient's family history includes Arthritis in his mother; Crohn's disease in his sister; Diabetes in his father and paternal grandfather; Heart disease in his father and mother; Hyperlipidemia in his mother; Stroke in his mother. There is no history of Cancer or Colon cancer.  ROS:   Please see the history of present illness.     All other systems reviewed and are negative.  EKGs/Labs/Other Studies Reviewed:    The following studies were reviewed today: Echo Aug 2019  EKG:  EKG is ordered today.  The ekg ordered today demonstrates NSR- HR 63  Recent  Labs: No results found for requested labs within last 8760 hours.  Recent Lipid Panel    Component Value Date/Time   CHOL 190 06/18/2017 1312   TRIG 161 (H) 06/18/2017 1312   HDL 42 06/18/2017 1312   CHOLHDL 4.5 06/18/2017 1312   VLDL 34 11/04/2014 0958   LDLCALC 120 (H) 06/18/2017 1312    Physical Exam:    VS:  BP (!) 217/119   Pulse 63   Ht 6\' 1"  (1.854 m)   Wt 237 lb (107.5 kg)   SpO2 97%   BMI 31.27 kg/m     Wt Readings from Last 3 Encounters:  04/23/19 237 lb (107.5 kg)  11/22/18 226 lb 9.6 oz (102.8 kg)  09/09/18 229 lb 3.2 oz (104 kg)     GEN: Well nourished, well developed fit appearing male in no acute distress HEENT: Normal NECK: No JVD; transmitted murmur to both carotids LYMPHATICS: No lymphadenopathy CARDIAC: RRR, 2/6 systolic murmur heard throughout the precordium, diminished S2, , rubs, gallops RESPIRATORY:  Clear to auscultation without rales, wheezing or rhonchi  ABDOMEN: Soft, non-tender, non-distended MUSCULOSKELETAL:  No edema; No deformity  SKIN: Warm and dry NEUROLOGIC:  Alert and oriented x 3 PSYCHIATRIC:  Normal affect   ASSESSMENT:    Uncontrolled hypertension Repeat B/P by me 184/ 104-   Moderate aortic stenosis Moderate AS- last echo Aug 2019 showed slight increase in his gradients from Oct 2018  Dyslipidemia, goal LDL below 100 Will check lipids when I check his echo ( his last LDL 122-Oct 2019)  Sleep apnea On C-pap, compliant  Pre-operative clearance Will hold on clearance until B/P is controlled.  When cleared there will be no need for SBE prophylaxis based on guidelines.   PLAN:    I changed his lisinopril to lisinopril /HCTZ 20/12.5 and added Amlodipine 5 mg.  I'll see him back in two weeks and check BMP, fasting lipids, and order an echo at that time.  I asked him to put off his wisdom teeth removal until his B/P is under better control.  He will not need SBE prophylaxis for this based on current recommendations.     Medication Adjustments/Labs and Tests Ordered: Current medicines are reviewed at length with the patient today.  Concerns regarding medicines are outlined above.  Orders Placed This Encounter  Procedures  . Basic Metabolic Panel (BMET)  . EKG 12-Lead   Meds ordered this encounter  Medications  . lisinopril-hydrochlorothiazide (ZESTORETIC)  20-12.5 MG tablet    Sig: Take 1 tablet by mouth daily.    Dispense:  30 tablet    Refill:  6  . amLODipine (NORVASC) 5 MG tablet    Sig: Take 1 tablet (5 mg total) by mouth daily.    Dispense:  30 tablet    Refill:  6    Patient Instructions  Medication Instructions:  STOP- Lisinopril 30 mg START- Lisinopirl/HCTZ 20/12.5 mg by mouth daily START- Amlodipine 5 mg by mouth daily  If you need a refill on your cardiac medications before your next appointment, please call your pharmacy.  Labwork: BMP in 2 week HERE IN OUR OFFICE AT [facility]  You will NOT need to fast   Take the provided lab slips with you to the lab for your blood draw.   When you have your labs (blood work) drawn today and your tests are completely normal, you will receive your results only by MyChart Message (if you have MyChart) -OR-  A paper copy in the mail.  If you have any lab test that is abnormal or we need to change your treatment, we will call you to review these results.  Testing/Procedures: None Ordered   Follow-Up: . Your physician recommends that you schedule a follow-up appointment in: 2 Weeks with [place], you and your health needs are our priority.  As part of our continuing mission to provide you with exceptional heart care, we have created designated Provider Care Teams.  These Care Teams include your primary Cardiologist (physician) and Advanced Practice Providers (APPs -  Physician Assistants and Nurse Practitioners) who all work together to provide you with the care you need, when you need it.  Thank you for choosing [facility]  at [facility], [name], [place]  04/23/2019 10:34 AM    [place]

## 2019-04-24 ENCOUNTER — Other Ambulatory Visit: Payer: Self-pay | Admitting: Cardiovascular Disease

## 2019-05-06 ENCOUNTER — Encounter: Payer: Self-pay | Admitting: Cardiology

## 2019-05-06 ENCOUNTER — Ambulatory Visit: Payer: BC Managed Care – PPO | Admitting: Cardiology

## 2019-05-06 ENCOUNTER — Other Ambulatory Visit: Payer: Self-pay

## 2019-05-06 VITALS — BP 120/84 | HR 74 | Temp 97.5°F | Ht 73.0 in | Wt 233.0 lb

## 2019-05-06 DIAGNOSIS — I350 Nonrheumatic aortic (valve) stenosis: Secondary | ICD-10-CM | POA: Diagnosis not present

## 2019-05-06 DIAGNOSIS — Z01818 Encounter for other preprocedural examination: Secondary | ICD-10-CM

## 2019-05-06 DIAGNOSIS — I10 Essential (primary) hypertension: Secondary | ICD-10-CM | POA: Diagnosis not present

## 2019-05-06 DIAGNOSIS — E785 Hyperlipidemia, unspecified: Secondary | ICD-10-CM | POA: Diagnosis not present

## 2019-05-06 DIAGNOSIS — G4730 Sleep apnea, unspecified: Secondary | ICD-10-CM

## 2019-05-06 LAB — BASIC METABOLIC PANEL
BUN/Creatinine Ratio: 23 — ABNORMAL HIGH (ref 9–20)
BUN: 20 mg/dL (ref 6–24)
CO2: 24 mmol/L (ref 20–29)
Calcium: 10.3 mg/dL — ABNORMAL HIGH (ref 8.7–10.2)
Chloride: 98 mmol/L (ref 96–106)
Creatinine, Ser: 0.87 mg/dL (ref 0.76–1.27)
GFR calc Af Amer: 112 mL/min/{1.73_m2} (ref >59)
GFR calc non Af Amer: 97 mL/min/{1.73_m2} (ref >59)
Glucose: 93 mg/dL (ref 65–99)
Potassium: 4.3 mmol/L (ref 3.5–5.2)
Sodium: 136 mmol/L (ref 134–144)

## 2019-05-06 NOTE — Patient Instructions (Signed)
Medication Instructions:  Your physician recommends that you continue on your current medications as directed. Please refer to the Current Medication list given to you today. If you need a refill on your cardiac medications before your next appointment, please call your pharmacy.   Lab work: Your physician recommends that you return for lab work in: [place] If you have labs (blood work) drawn today and your tests are completely normal, you will receive your results only by: Marland Kitchen MyChart Message (if you have MyChart) OR . A paper copy in the mail If you have any lab test that is abnormal or we need to change your treatment, we will call you to review the results.  Testing/Procedures: Your physician has requested that you have an echocardiogram. Echocardiography is a painless test that uses sound waves to create images of your heart. It provides your doctor with information about the size and shape of your heart and how well your heart's chambers and valves are working. This procedure takes approximately one hour. There are no restrictions for this procedure. [place]  Follow-Up: At [facility], you and your health needs are our priority.  As part of our continuing mission to provide you with exceptional heart care, we have created designated Provider Care Teams.  These Care Teams include your primary Cardiologist (physician) and Advanced Practice Providers (APPs -  Physician Assistants and Nurse Practitioners) who all work together to provide you with the care you need, when you need it. You will need a follow up appointment in 12 months.  Please call our office 2 months in advance to schedule this appointment.  You may see Shelva Majestic, MD or one of the following Advanced Practice Providers on your designated Care Team: [place], [place] . Fabian Sharp, PA-C  Any Other Special Instructions Will Be Listed Below (If Applicable).

## 2019-05-06 NOTE — Assessment & Plan Note (Signed)
Check lipids and CMET. 

## 2019-05-06 NOTE — Assessment & Plan Note (Signed)
On C-pap 

## 2019-05-06 NOTE — Assessment & Plan Note (Signed)
Moderate AS- last echo Aug 2019

## 2019-05-06 NOTE — Assessment & Plan Note (Signed)
OK to have wisdom teeth removed.  No SBE prophylaxis required.

## 2019-05-06 NOTE — Progress Notes (Signed)
Cardiology Office Note:    Date:  05/06/2019   ID:  Chris Skinner, DOB [birth year], MRN [id number]  PCP:  Denita Lung, MD  Cardiologist:  Shelva Majestic, MD  Electrophysiologist:  None   Referring MD: Denita Lung, MD   Chief Complaint  Patient presents with  . Follow-up    blood pressure check     History of Present Illness:    Chris Skinner is a 53 y.o. male with a hx of moderate aortic stenosis, hypertension, dyslipidemia, and sleep apnea on CPAP. 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  His last echo Aug 2019 showed a slight increase in his gradients but still "moderste AS.  I saw him in the office on 04/23/2019.  He was seen for preop clearance prior to having his wisdom teeth removed.  His blood pressure was high, 217/119.  He was having no chest pain or unusual dyspnea.  He been compliant with medications and his CPAP.  I added HCTZ to his Lisinopril and Norvasc 5 mg.  He is seen today in follow-up.  He is tolerating these medications well.  His blood pressure in the office today is 122/84.  He did mention he was having some discomfort in his back between his shoulders but it was not exertional and he could get some relief with positioning.  I suspect this is musculoskeletal.  I reviewed his last echocardiogram with him.    Past Medical History:  Diagnosis Date  . Chest pain 2001   [facility], normal cardiac workup  . Dyslipidemia   . H/O echocardiogram 1/15   PFO found incidentally.  Echo done due to murmur  . Hyperlipidemia   . Hypertension   . Personal history of colonic polyp - adenoma 01/05/2014  . Tobacco use disorder   . Wears glasses     Past Surgical History:  Procedure Laterality Date  . COLONOSCOPY  2015  . KNEE ARTHROSCOPY Left 1985  . TONSILLECTOMY AND ADENOIDECTOMY  1974    Current Medications: Current Meds  Medication Sig  . amLODipine (NORVASC) 5 MG tablet Take 1 tablet (5 mg total) by mouth daily.  Marland Kitchen lisinopril-hydrochlorothiazide (ZESTORETIC) 20-12.5  MG tablet Take 1 tablet by mouth daily.  . rosuvastatin (CRESTOR) 40 MG tablet TAKE 1 TABLET (40 MG TOTAL) DAILY BY MOUTH.     Allergies:   Patient has no known allergies.   Social History   Socioeconomic History  . Marital status: Married    Spouse name: Not on file  . Number of children: Not on file  . Years of education: Not on file  . Highest education level: Not on file  Occupational History  . 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 Financial resource strain: Not on file  . Food insecurity    Worry: Not on file    Inability: Not on file  . Transportation needs    Medical: Not on file    Non-medical: Not on file  Tobacco Use  . Smoking status: Current Every Day Smoker    Last attempt to quit: 09/22/2013    Years since quitting: 5.6  . Smokeless tobacco: Former Systems developer    Types: [place] date: 2009  Substance and Sexual Activity  . Alcohol use: Yes    Alcohol/week: 10.0 standard drinks    Types: 10 Cans of beer per week  . Drug use:  No  . 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 Physical activity    Days per week: Not on file    Minutes per session: Not on file  . Stress: Not on file  Relationships  . Social Herbalist on phone: Not on file    Gets together: Not on file    Attends religious service: Not on file    Active member of club or organization: Not on file    Attends meetings of clubs or organizations: Not on file    Relationship status: Not on file  Other Topics Concern  . Not on file  Social History Narrative   Exercise - calisthenics, walking in afternoons 3-5 days per week, single, 2 children     Family History: The patient's family history includes Arthritis in his mother; Crohn's disease in his sister; Diabetes in his father and paternal grandfather; Heart  disease in his father and mother; Hyperlipidemia in his mother; Stroke in his mother. There is no history of Cancer or Colon cancer.  ROS:   Please see the history of present illness.     All other systems reviewed and are negative.  EKGs/Labs/Other Studies Reviewed:    The following studies were reviewed today: Echo 2019  Recent Labs: 05/05/2019: BUN 20; Creatinine, Ser 0.87; Potassium 4.3; Sodium 136  Recent Lipid Panel    Component Value Date/Time   CHOL 190 06/18/2017 1312   TRIG 161 (H) 06/18/2017 1312   HDL 42 06/18/2017 1312   CHOLHDL 4.5 06/18/2017 1312   VLDL 34 11/04/2014 0958   LDLCALC 120 (H) 06/18/2017 1312    Physical Exam:    VS:  BP 120/84   Pulse 74   Temp (!) 97.5 F (36.4 C) (Temporal)   Ht 6\' 1"  (1.854 m)   Wt 233 lb (105.7 kg)   SpO2 96%   BMI 30.74 kg/m     Wt Readings from Last 3 Encounters:  05/06/19 233 lb (105.7 kg)  04/23/19 237 lb (107.5 kg)  11/22/18 226 lb 9.6 oz (102.8 kg)     GEN:  Well nourished, well developed in no acute distress HEENT: Normal NECK: No JVD; transmitted murmur to his carotids LYMPHATICS: No lymphadenopathy CARDIAC: RRR, 2/6 systolic murmur AOV, preserved S2, rubs, gallops RESPIRATORY:  Clear to auscultation without rales, wheezing or rhonchi  ABDOMEN: Soft, non-tender, non-distended MUSCULOSKELETAL:  No edema; No deformity  SKIN: Warm and dry NEUROLOGIC:  Alert and oriented x 3 PSYCHIATRIC:  Normal affect   ASSESSMENT:    Uncontrolled hypertension B/P under better control after the addition of HCTZ and Amlodipine 5 mg  Moderate aortic stenosis Moderate AS- last echo Aug 2019  Dyslipidemia, goal LDL below 100 Check lipids and CMET  Sleep apnea On C-pap  Pre-operative clearance OK to have wisdom teeth removed.  No SBE prophylaxis required.   PLAN:    Check echo, lipids, CMET.  F/U Dr Claiborne Billings one year.    Medication Adjustments/Labs and Tests Ordered: Current medicines are reviewed at length with  the patient today.  Concerns regarding medicines are outlined above.  Orders Placed This Encounter  Procedures  . Comprehensive Metabolic Panel (CMET)  . Lipid panel  . ECHOCARDIOGRAM COMPLETE   No orders of the defined types were placed in this encounter.   Patient Instructions  Medication Instructions:  Your physician recommends that you  continue on your current medications as directed. Please refer to the Current Medication list given to you today. If you need a refill on your cardiac medications before your next appointment, please call your pharmacy.   Lab work: Your physician recommends that you return for lab work in: [place] If you have labs (blood work) drawn today and your tests are completely normal, you will receive your results only by: Marland Kitchen MyChart Message (if you have MyChart) OR . A paper copy in the mail If you have any lab test that is abnormal or we need to change your treatment, we will call you to review the results.  Testing/Procedures: Your physician has requested that you have an echocardiogram. Echocardiography is a painless test that uses sound waves to create images of your heart. It provides your doctor with information about the size and shape of your heart and how well your heart's chambers and valves are working. This procedure takes approximately one hour. There are no restrictions for this procedure. [place]  Follow-Up: At [facility], you and your health needs are our priority.  As part of our continuing mission to provide you with exceptional heart care, we have created designated Provider Care Teams.  These Care Teams include your primary Cardiologist (physician) and Advanced Practice Providers (APPs -  Physician Assistants and Nurse Practitioners) who all work together to provide you with the care you need, when you need it. You will need a follow up appointment in 12 months.  Please call our  office 2 months in advance to schedule this appointment.  You may see Shelva Majestic, MD or one of the following Advanced Practice Providers on your designated Care Team: [place], [place] . Fabian Sharp, PA-C  Any Other Special Instructions Will Be Listed Below (If Applicable).      Signed, [name], PA-C  05/06/2019 8:35 AM    [facility]

## 2019-05-06 NOTE — Assessment & Plan Note (Signed)
B/P under better control after the addition of HCTZ and Amlodipine 5 mg

## 2019-05-21 ENCOUNTER — Other Ambulatory Visit: Payer: Self-pay

## 2019-05-21 ENCOUNTER — Ambulatory Visit (HOSPITAL_COMMUNITY): Payer: BC Managed Care – PPO | Attending: Cardiology

## 2019-05-21 DIAGNOSIS — I350 Nonrheumatic aortic (valve) stenosis: Secondary | ICD-10-CM | POA: Insufficient documentation

## 2019-05-21 LAB — COMPREHENSIVE METABOLIC PANEL
ALT: 41 IU/L (ref 0–44)
AST: 31 IU/L (ref 0–40)
Albumin/Globulin Ratio: 2.1 (ref 1.2–2.2)
Albumin: 4.7 g/dL (ref 3.8–4.9)
Alkaline Phosphatase: 55 IU/L (ref 39–117)
BUN/Creatinine Ratio: 23 — ABNORMAL HIGH (ref 9–20)
BUN: 21 mg/dL (ref 6–24)
Bilirubin Total: 0.6 mg/dL (ref 0.0–1.2)
CO2: 21 mmol/L (ref 20–29)
Calcium: 9.6 mg/dL (ref 8.7–10.2)
Chloride: 104 mmol/L (ref 96–106)
Creatinine, Ser: 0.91 mg/dL (ref 0.76–1.27)
GFR calc Af Amer: 109 mL/min/{1.73_m2} (ref >59)
GFR calc non Af Amer: 95 mL/min/{1.73_m2} (ref >59)
Globulin, Total: 2.2 g/dL (ref 1.5–4.5)
Glucose: 113 mg/dL — ABNORMAL HIGH (ref 65–99)
Potassium: 4.5 mmol/L (ref 3.5–5.2)
Sodium: 139 mmol/L (ref 134–144)
Total Protein: 6.9 g/dL (ref 6.0–8.5)

## 2019-05-21 LAB — LIPID PANEL
Chol/HDL Ratio: 3.9 ratio (ref 0.0–5.0)
Cholesterol, Total: 191 mg/dL (ref 100–199)
HDL: 49 mg/dL (ref >39)
LDL Chol Calc (NIH): 124 mg/dL — ABNORMAL HIGH (ref 0–99)
Triglycerides: 97 mg/dL (ref 0–149)
VLDL Cholesterol Cal: 18 mg/dL (ref 5–40)

## 2019-05-27 ENCOUNTER — Other Ambulatory Visit: Payer: Self-pay

## 2019-05-27 DIAGNOSIS — E785 Hyperlipidemia, unspecified: Secondary | ICD-10-CM

## 2019-07-02 ENCOUNTER — Telehealth: Payer: Self-pay | Admitting: Cardiology

## 2019-07-02 NOTE — Telephone Encounter (Signed)
New Message   [place] with Chris Skinner and Skinner is calling because they never did receive the fax showing that the patient is cleared from a cardiac stand point. The patient was seen on 8/12, 8/25 as well as had an echocardiogram done on 9/9. Please send cardiac clearance and included HNP       [facility] Pre-operative Risk Assessment    Request for surgical clearance:  1. What type of surgery is being performed? Coronectomy #17, two molars and , two wisdom teeth  2. When is this surgery scheduled? 07/03/2019  3. What type of clearance is required (medical clearance vs. Pharmacy clearance to hold med vs. Both)? Medical Clearance   4. Are there any medications that need to be held prior to surgery and how long? No   5. Practice name and name of physician performing surgery? Chris Skinner, Dr. Carmine Skinner   6. What is your office phone number [phone]    7.   What is your office fax number [phone]  8.   Anesthesia type (None, local, MAC, general) ? General IV Sedation   Chris Skinner 07/02/2019, 11:54 AM  _________________________________________________________________   (provider comments below)

## 2019-07-22 ENCOUNTER — Other Ambulatory Visit: Payer: Self-pay | Admitting: Cardiovascular Disease

## 2019-07-22 DIAGNOSIS — E785 Hyperlipidemia, unspecified: Secondary | ICD-10-CM

## 2019-08-29 ENCOUNTER — Other Ambulatory Visit: Payer: Self-pay | Admitting: Adult Health

## 2019-09-24 ENCOUNTER — Ambulatory Visit: Payer: BC Managed Care – PPO | Attending: Internal Medicine

## 2019-09-24 DIAGNOSIS — Z20822 Contact with and (suspected) exposure to covid-19: Secondary | ICD-10-CM

## 2019-09-25 LAB — NOVEL CORONAVIRUS, NAA: SARS-CoV-2, NAA: NOT DETECTED

## 2019-10-12 ENCOUNTER — Other Ambulatory Visit: Payer: Self-pay | Admitting: Cardiovascular Disease

## 2019-10-12 DIAGNOSIS — E785 Hyperlipidemia, unspecified: Secondary | ICD-10-CM

## 2019-11-08 ENCOUNTER — Other Ambulatory Visit: Payer: Self-pay | Admitting: Adult Health

## 2020-03-03 ENCOUNTER — Telehealth: Payer: Self-pay

## 2020-03-03 NOTE — Telephone Encounter (Signed)
error 

## 2020-03-30 ENCOUNTER — Ambulatory Visit: Payer: BC Managed Care – PPO | Admitting: Cardiovascular Disease

## 2020-03-30 ENCOUNTER — Encounter: Payer: Self-pay | Admitting: Cardiovascular Disease

## 2020-03-30 ENCOUNTER — Other Ambulatory Visit: Payer: Self-pay

## 2020-03-30 VITALS — BP 120/90 | HR 64 | Ht 73.0 in | Wt 238.6 lb

## 2020-03-30 DIAGNOSIS — I10 Essential (primary) hypertension: Secondary | ICD-10-CM

## 2020-03-30 DIAGNOSIS — Z79899 Other long term (current) drug therapy: Secondary | ICD-10-CM | POA: Diagnosis not present

## 2020-03-30 DIAGNOSIS — G4733 Obstructive sleep apnea (adult) (pediatric): Secondary | ICD-10-CM | POA: Diagnosis not present

## 2020-03-30 DIAGNOSIS — I350 Nonrheumatic aortic (valve) stenosis: Secondary | ICD-10-CM | POA: Diagnosis not present

## 2020-03-30 DIAGNOSIS — E7800 Pure hypercholesterolemia, unspecified: Secondary | ICD-10-CM

## 2020-03-30 DIAGNOSIS — Z720 Tobacco use: Secondary | ICD-10-CM

## 2020-03-30 NOTE — Progress Notes (Signed)
Cardiology Office Note    Date:  03/31/2020   ID:  TEE [name], DOB 10-17-1965, MRN [id number]  PCP:  Denita Lung, MD  Cardiologist:  Shelva Majestic, MD   Chief Complaint  Patient presents with  . Follow-up   F/U cardiology consultation, initially referred through the courtesy of Dr. [name]  History of Present Illness:  Chris Skinner is a 54 y.o. male who was referred for evaluation of aortic stenosis through the courtesy of Dr. [name].  I last saw him in February 2019.  He presents for follow-up evaluation  Chris Skinner has a history of hypertension, hyperlipidemia, and obstructive sleep apnea.  He has a history of tobacco use and currently smokes less than a pack per day but has been smoking for over 35 years.  He had quit smoking for year and a half, but then unfortunately resumed.  He is a Psychiatrist.  He was recently evaluated by Dr. Redmond School and was referred for follow-up echo Doppler study due to.  It is cardiac murmur.  Of note, in January 2015.  He had undergone an echo Doppler study which showed normal systolic function with an EF of 60-65%.  There was a suggestion of a small patent foramen ovale.  He had a mean aortic gradient of 10 and a peak gradient of 18 mm, suggesting very mild aortic stenosis.  At that time.  He underwent a repeat echo Doppler study on 06/21/2017, which continue to show normal LV function with an EF of 55-60%.  However, the aortic valve.  Functionally bicuspid and was calcified with mobility restriction.  He was felt to have mild to moderate aortic stenosis with a mean gradient of 22 and his peak gradient had increased to 46 mm.  There was no mention of a PFO.  There was mild TR.  He has a history of obstructive sleep apnea and in July 2017 he had undergone a sleep study which I personally reviewed.  This revealed moderate sleep apnea with an AHI of 27.9 per hour but his RDI was 42.  He had severe sleep apnea, doing rems sleep  with an AHI of 56.7 per hour.  Oxygen nadir was 87%.  He has been on CPAP therapy and is followed by Dr. Annamaria Boots.  At times, she admits to being tired.  He has not had a recent download.  His DME company is [employer].   He  has a history of hyperlipidemia and has been on rosuvastatin 20 mg.  2 years ago, total cholesterol was 226.  Lab work in October 2018: total cholesterol 190, triglycerides 161, LDL cholesterol was 120.  Non-HDL cholesterol was 148.    When I initially saw him, his blood pressure was elevated and on repeat by me was 160/100.  Discussed with him the new hypertensive guidelines.  I recommended further titration of lisinopril from 10 mg up to 15 mg for one week and then up to 20 mg daily.  I also recommended more aggressive lipid therapy with his elevated LDL.  I discussed the importance of smoking cessation and the need for follow-up of his aortic stenosis.  I last saw him in February 2019 at which time he continued to be asymptomatic.  He was exercising regularly and potentially walking up to 5 to 7 miles per day.  Unfortunately he was still smoking cigarettes.  He had quit for short duration in his early 50s but resumed again.  He denied  any chest pain, presyncope or syncope or palpitations.    Over the last several years, he has remained stable.  He was evaluated by [name] in August 2020 for preoperative clearance prior to undergoing wisdom teeth removal and was stable.  He has had issues with hypertension and hydrochlorothiazide had been added to his lisinopril and amlodipine.  Presently he denies chest pain PND orthopnea.  He is unaware of palpitations.  His last echo Doppler study was in September 2020 which showed normal systolic function with grade 2 diastolic dysfunction.  That time his mean valve gradient had increased to 31 mm.  Aortic valve area was 1.14 cm consistent with moderate stenosis.  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  His schedule become significantly  busy at that time.  He admits to smoking approximately 5 cigarettes/day.  He presents for follow-up evaluation.  Past Medical History:  Diagnosis Date  . Chest pain 2001   [place], normal cardiac workup  . Dyslipidemia   . H/O echocardiogram 1/15   PFO found incidentally.  Echo done due to murmur  . Hyperlipidemia   . Hypertension   . Personal history of colonic polyp - adenoma 01/05/2014  . Tobacco use disorder   . Wears glasses     Past Surgical History:  Procedure Laterality Date  . COLONOSCOPY  2015  . KNEE ARTHROSCOPY Left 1985  . TONSILLECTOMY AND ADENOIDECTOMY  1974    Current Medications: Outpatient Medications Prior to Visit  Medication Sig Dispense Refill  . amLODipine (NORVASC) 5 MG tablet TAKE 1 TABLET BY MOUTH EVERY DAY 90 tablet 2  . Cetirizine HCl (ZYRTEC PO) Take by mouth.    Marland Kitchen lisinopril-hydrochlorothiazide (ZESTORETIC) 20-12.5 MG tablet TAKE 1 TABLET BY MOUTH EVERY DAY 90 tablet 1  . Naproxen Sodium (ALEVE PO) Take by mouth.    . Omega-3 Fatty Acids (FISH OIL OMEGA-3 PO) Take by mouth.    . rosuvastatin (CRESTOR) 40 MG tablet TAKE 1 TABLET (40 MG TOTAL) DAILY BY MOUTH. 90 tablet 1   No facility-administered medications prior to visit.     Allergies:   Patient has no known allergies.   Social History   Socioeconomic History  . Marital status: Divorced    Spouse name: Not on file  . Number of children: Not on file  . Years of education: Not on file  . Highest education level: Not on file  Occupational History  . 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 Smoking status: Current Every Day Smoker    Last attempt to quit: 09/22/2013    Years since quitting: 6.5  . Smokeless tobacco: Former Systems developer    Types: [place] date: 2009  Substance and Sexual Activity  . Alcohol use: Yes    Alcohol/week: 10.0 standard drinks    Types: 10 Cans of beer per week  . Drug use: No  . Sexual  activity: Not on file    Comment: teach PE in high school, coaches football, exercises - walks 5 days per week; separated  Other Topics Concern  . Not on file  Social History Narrative   Exercise - calisthenics, walking in afternoons 3-5 days per week, single, 2 children   Social Determinants of Health   Financial Resource Strain:   . Difficulty of Paying Living Expenses:   Food Insecurity:   . Worried About Charity fundraiser in the Last  Year:   . Ran Out of Food in the Last Year:   Transportation Needs:   . Film/video editor (Medical):   Marland Kitchen Lack of Transportation (Non-Medical):   Physical Activity:   . Days of Exercise per Week:   . Minutes of Exercise per Session:   Stress:   . Feeling of Stress :   Social Connections:   . Frequency of Communication with Friends and Family:   . Frequency of Social Gatherings with Friends and Family:   . Attends Religious Services:   . Active Member of Clubs or Organizations:   . Attends Archivist Meetings:   Marland Kitchen Marital Status:      Family History:  The patient's  family history includes Arthritis in his mother; Crohn's disease in his sister; Diabetes in his father and paternal grandfather; Heart disease in his father and mother; Hyperlipidemia in his mother; Stroke in his mother.   ROS General: Negative; No fevers, chills, or night sweats;  HEENT: Negative; No changes in vision or hearing, sinus congestion, difficulty swallowing Pulmonary: Negative; No cough, wheezing, shortness of breath, hemoptysis Cardiovascular: Negative; No chest pain, presyncope, syncope, palpitations GI: Negative; No nausea, vomiting, diarrhea, or abdominal pain GU: Negative; No dysuria, hematuria, or difficulty voiding Musculoskeletal: Negative; no myalgias, joint pain, or weakness Hematologic/Oncology: Negative; no easy bruising, bleeding Endocrine: Negative; no heat/cold intolerance; no diabetes Neuro: Negative; no changes in balance,  headaches Skin: Negative; No rashes or skin lesions Psychiatric: Negative; No behavioral problems, depression Sleep: Negative; No snoring, daytime sleepiness, hypersomnolence, bruxism, restless legs, hypnogognic hallucinations, no cataplexy Other comprehensive 14 point system review is negative.   PHYSICAL EXAM:   VS:  BP 120/90 (BP Location: Right Arm, Patient Position: Sitting, Cuff Size: Large)   Pulse 64   Ht '6\' 1"'$  (1.854 m)   Wt 238 lb 9.6 oz (108.2 kg)   BMI 31.48 kg/m     Repeat blood pressure by me was 124/80 supine 120/80 standing  Wt Readings from Last 3 Encounters:  03/30/20 238 lb 9.6 oz (108.2 kg)  05/06/19 233 lb (105.7 kg)  04/23/19 237 lb (107.5 kg)      Physical Exam BP 120/90 (BP Location: Right Arm, Patient Position: Sitting, Cuff Size: Large)   Pulse 64   Ht '6\' 1"'$  (1.854 m)   Wt 238 lb 9.6 oz (108.2 kg)   BMI 31.48 kg/m  General: Alert, oriented, no distress.  Skin: normal turgor, no rashes, warm and dry HEENT: Normocephalic, atraumatic. Pupils equal round and reactive to light; sclera anicteric; extraocular muscles intact;  Nose without nasal septal hypertrophy Mouth/Parynx benign; Mallinpatti scale 3 Neck: No JVD, no carotid bruits; normal carotid upstroke Lungs: clear to ausculatation and percussion; no wheezing or rales Chest wall: without tenderness to palpitation Heart: PMI not displaced, RRR, s1 s2 normal, 2/6 mid peaking systolic murmur in the aortic area, no diastolic murmur, no rubs, gallops, thrills, or heaves Abdomen: soft, nontender; no hepatosplenomehaly, BS+; abdominal aorta nontender and not dilated by palpation. Back: no CVA tenderness Pulses 2+ Musculoskeletal: full range of motion, normal strength, no joint deformities Extremities: no clubbing cyanosis or edema, Homan's sign negative  Neurologic: grossly nonfocal; Cranial nerves grossly wnl Psychologic: Normal mood and affect   Studies/Labs Reviewed:   EKG:  EKG is ordered  today.  ECG (independently read by me): NSR at 64; no ectopy; early trasition  February 2019 ECG (independently read by me): Normal sinus rhythm at 61 bpm.  No ectopy.  Normal intervals.  November 2018 ECG (independently read by me): Normal sinus rhythm at 60 bpm.  PR interval 200 ms.  No ST segment changes.  QTc interval 430 ms   Recent Labs: BMP Latest Ref Rng & Units 05/21/2019 05/05/2019 08/09/2017  Glucose 65 - 99 mg/dL 113(H) 93 91  BUN 6 - 24 mg/dL '21 20 17  '$ Creatinine 0.76 - 1.27 mg/dL 0.91 0.87 0.82  BUN/Creat Ratio 9 - 20 23(H) 23(H) 21(H)  Sodium 134 - 144 mmol/L 139 136 140  Potassium 3.5 - 5.2 mmol/L 4.5 4.3 4.4  Chloride 96 - 106 mmol/L 104 98 102  CO2 20 - 29 mmol/L '21 24 23  '$ Calcium 8.7 - 10.2 mg/dL 9.6 10.3(H) 9.6     Hepatic Function Latest Ref Rng & Units 05/21/2019 06/18/2017 11/04/2014  Total Protein 6.0 - 8.5 g/dL 6.9 6.5 7.2  Albumin 3.8 - 4.9 g/dL 4.7 - 4.6  AST 0 - 40 IU/L 31 20 33  ALT 0 - 44 IU/L 41 29 58(H)  Alk Phosphatase 39 - 117 IU/L 55 - 44  Total Bilirubin 0.0 - 1.2 mg/dL 0.6 0.8 0.9    CBC Latest Ref Rng & Units 06/18/2017 11/04/2014 09/09/2013  WBC 3.8 - 10.8 Thousand/uL 11.1(H) 9.7 9.9  Hemoglobin 13.2 - 17.1 g/dL 15.6 15.9 16.7  Hematocrit 38 - 50 % 45.2 45.4 46.5  Platelets 140 - 400 Thousand/uL 357 338 308   Lab Results  Component Value Date   MCV 90.0 06/18/2017   MCV 90.8 11/04/2014   MCV 89.8 09/09/2013   Lab Results  Component Value Date   TSH 0.941 11/04/2014   Lab Results  Component Value Date   HGBA1C 5.3 06/18/2017     BNP No results found for: BNP  ProBNP No results found for: PROBNP   Lipid Panel     Component Value Date/Time   CHOL 191 05/21/2019 0836   TRIG 97 05/21/2019 0836   HDL 49 05/21/2019 0836   CHOLHDL 3.9 05/21/2019 0836   CHOLHDL 4.5 06/18/2017 1312   VLDL 34 11/04/2014 0958   LDLCALC 124 (H) 05/21/2019 0836   LDLCALC 120 (H) 06/18/2017 1312     RADIOLOGY: No results found.   Additional  studies/ records that were reviewed today include:  I reviewed the patient's prior echo Doppler study from 09/29/2013 and is most recent study of 06/21/2017.  I also personally reviewed his diagnostic polysomnogram from 03/27/2016 and recent laboratory and records from Dr. Redmond School.  ECHO; 05/21/2019 IMPRESSIONS  1. The average left ventricular global longitudinal strain is -21.4 %.  2. The left ventricle has normal systolic function, with an ejection  fraction of 55-60%. The cavity size was normal. Left ventricular diastolic  Doppler parameters are consistent with pseudonormalization. No evidence of  left ventricular regional wall  motion abnormalities.  3. The right ventricle has normal systolic function. The cavity was  normal. There is no increase in right ventricular wall thickness. Right  ventricular systolic pressure could not be assessed.  4. Left atrial size was mildly dilated.  5. There is mild to moderate mitral annular calcification present.  6. The aortic valve is tricuspid. Moderate thickening of the aortic  valve. Severe calcifcation of the aortic valve. Moderate stenosis of the  aortic valve. Mean AVG 62mHg. AVA 1.14cm2. Dimensionless index 36.  7. The aorta is normal unless otherwise noted.  8. Compared to prior echo the mean AVG has increased from 25 to 372mg.   ASSESSMENT:    1. Moderate aortic  stenosis   2. Essential hypertension   3. OSA (obstructive sleep apnea)   4. Medication management   5. Pure hypercholesterolemia   6. Tobacco abuse     PLAN:  Mr. Oleg Oleson is a 54 year old gentleman who has a history of hypertension and hyperlipidemia.   When I initially saw him he was hypertensive with stage II hypertension despite being on lisinopril 10 mg.  At that time I further titrated lisinopril to 20 mg daily.  He was on rosuvastatin for hyperlipidemia.  His initial echo Doppler study in January 2015 showed mild aortic stenosis with a mean gradient of 10,  and peak gradient of 18 mm.  He had normal LV function.  A subsequent echo 3 years later in October 2018 showed an EF of 55 to 60% and there was slight progression of his AS now was mild to moderate with a mean gradient of 22 and peak gradient at 46 mm.  His last echo in 2020 showed additional progression of his aortic stenosis with an EF of 55 to 60% and grade 2 diastolic dysfunction.  Mean gradient was now 31 mm and peak gradient was 55.8 mm.  Aortic valve area was 1.1 cm consistent with at least moderate aortic stenosis.  He continues to be asymptomatic with reference to chest pain, shortness of breath, presyncope or syncope.  He continues to use CPAP with 100% compliance.  Blood pressure today on repeat by me was stable without orthostatic change.  I am recommending a follow-up echo Doppler study to be done over the next week prior to him initiating football practice.  I am also recommending a complete set of laboratory since none has been done recently.  He has continued to be on rosuvastatin which now is at 40 mg daily in addition to amlodipine 5 mg as well as lisinopril HCT 20/12.5 mg.  I discussed the importance of complete smoking cessation.  I will contact him regarding his echo Doppler data and laboratory and adjustments will be made accordingly.  As long as he remains stable, I will see him in 6 months for reevaluation.   Medication Adjustments/Labs and Tests Ordered: Current medicines are reviewed at length with the patient today.  Concerns regarding medicines are outlined above.  Medication changes, Labs and Tests ordered today are listed in the Patient Instructions below. Patient Instructions  Medication Instructions:  CONTINUE WITH CURRENT MEDICATIONS. NO CHANGES.  *If you need a refill on your cardiac medications before your next appointment, please call your pharmacy*   Lab Work: Fasting labs: cmet Cbc tsh lipid If you have labs (blood work) drawn today and your tests are  completely normal, you will receive your results only by: Marland Kitchen MyChart Message (if you have MyChart) OR . A paper copy in the mail If you have any lab test that is abnormal or we need to change your treatment, we will call you to review the results.   Testing/Procedures: asap! Your physician has requested that you have an echocardiogram. Echocardiography is a painless test that uses sound waves to create images of your heart. It provides your doctor with information about the size and shape of your heart and how well your heart's chambers and valves are working. This procedure takes approximately one hour. There are no restrictions for this procedure.  [address]   Follow-Up: At [facility], you and your health needs are our priority.  As part of our continuing mission to provide you with exceptional heart care,  we have created designated Provider Care Teams.  These Care Teams include your primary Cardiologist (physician) and Advanced Practice Providers (APPs -  Physician Assistants and Nurse Practitioners) who all work together to provide you with the care you need, when you need it.  We recommend signing up for the patient portal called "MyChart".  Sign up information is provided on this After Visit Summary.  MyChart is used to connect with patients for Virtual Visits (Telemedicine).  Patients are able to view lab/test results, encounter notes, upcoming appointments, etc.  Non-urgent messages can be sent to your provider as well.   To learn more about what you can do with MyChart, go to [email or web address].    Your next appointment:   6 month(s)  The format for your next appointment:   In Person  Provider:   Shelva Majestic, MD       Signed, Shelva Majestic, MD  03/31/2020 11:24 AM    [facility] [address] [place], [place], [place]  [address] Phone: [phone]

## 2020-03-30 NOTE — Patient Instructions (Signed)
Medication Instructions:  CONTINUE WITH CURRENT MEDICATIONS. NO CHANGES.  *If you need a refill on your cardiac medications before your next appointment, please call your pharmacy*   Lab Work: Fasting labs: cmet Cbc tsh lipid If you have labs (blood work) drawn today and your tests are completely normal, you will receive your results only by:  [place] (if you have MyChart) OR  A paper copy in the mail If you have any lab test that is abnormal or we need to change your treatment, we will call you to review the results.   Testing/Procedures: asap! Your physician has requested that you have an echocardiogram. Echocardiography is a painless test that uses sound waves to create images of your heart. It provides your doctor with information about the size and shape of your heart and how well your hearts chambers and valves are working. This procedure takes approximately one hour. There are no restrictions for this procedure.  [place]   Follow-Up: At [facility], you and your health needs are our priority.  As part of our continuing mission to provide you with exceptional heart care, we have created designated Provider Care Teams.  These Care Teams include your primary Cardiologist (physician) and Advanced Practice Providers (APPs -  Physician Assistants and Nurse Practitioners) who all work together to provide you with the care you need, when you need it.  We recommend signing up for the patient portal called "MyChart".  Sign up information is provided on this After Visit Summary.  MyChart is used to connect with patients for Virtual Visits (Telemedicine).  Patients are able to view lab/test results, encounter notes, upcoming appointments, etc.  Non-urgent messages can be sent to your provider as well.   To learn more about what you can do with MyChart, go to [email or web address].    Your next appointment:   6 month(s)  The format for your next appointment:    In Person  Provider:   Shelva Majestic, MD

## 2020-03-31 ENCOUNTER — Encounter: Payer: Self-pay | Admitting: Cardiovascular Disease

## 2020-04-01 ENCOUNTER — Other Ambulatory Visit: Payer: Self-pay

## 2020-04-01 ENCOUNTER — Ambulatory Visit (HOSPITAL_COMMUNITY): Payer: BC Managed Care – PPO | Attending: Cardiology

## 2020-04-01 DIAGNOSIS — I10 Essential (primary) hypertension: Secondary | ICD-10-CM | POA: Diagnosis present

## 2020-04-01 DIAGNOSIS — Z79899 Other long term (current) drug therapy: Secondary | ICD-10-CM | POA: Diagnosis present

## 2020-04-01 DIAGNOSIS — I350 Nonrheumatic aortic (valve) stenosis: Secondary | ICD-10-CM | POA: Diagnosis present

## 2020-04-01 LAB — ECHOCARDIOGRAM COMPLETE
AR max vel: 0.99 cm2
AV Area VTI: 0.93 cm2
AV Area mean vel: 0.91 cm2
AV Mean grad: 31 mmHg
AV Peak grad: 50.1 mmHg
Ao pk vel: 3.54 m/s
Area-P 1/2: 2.73 cm2
S' Lateral: 3 cm

## 2020-04-02 LAB — LIPID PANEL
Chol/HDL Ratio: 4.3 ratio (ref 0.0–5.0)
Cholesterol, Total: 203 mg/dL — ABNORMAL HIGH (ref 100–199)
HDL: 47 mg/dL (ref >39)
LDL Chol Calc (NIH): 128 mg/dL — ABNORMAL HIGH (ref 0–99)
Triglycerides: 156 mg/dL — ABNORMAL HIGH (ref 0–149)
VLDL Cholesterol Cal: 28 mg/dL (ref 5–40)

## 2020-04-02 LAB — COMPREHENSIVE METABOLIC PANEL
ALT: 46 IU/L — ABNORMAL HIGH (ref 0–44)
AST: 34 IU/L (ref 0–40)
Albumin/Globulin Ratio: 1.8 (ref 1.2–2.2)
Albumin: 4.3 g/dL (ref 3.8–4.9)
Alkaline Phosphatase: 57 IU/L (ref 48–121)
BUN/Creatinine Ratio: 24 — ABNORMAL HIGH (ref 9–20)
BUN: 20 mg/dL (ref 6–24)
Bilirubin Total: 0.7 mg/dL (ref 0.0–1.2)
CO2: 24 mmol/L (ref 20–29)
Calcium: 9.5 mg/dL (ref 8.7–10.2)
Chloride: 102 mmol/L (ref 96–106)
Creatinine, Ser: 0.83 mg/dL (ref 0.76–1.27)
GFR calc Af Amer: 114 mL/min/{1.73_m2} (ref >59)
GFR calc non Af Amer: 98 mL/min/{1.73_m2} (ref >59)
Globulin, Total: 2.4 g/dL (ref 1.5–4.5)
Glucose: 112 mg/dL — ABNORMAL HIGH (ref 65–99)
Potassium: 4.6 mmol/L (ref 3.5–5.2)
Sodium: 139 mmol/L (ref 134–144)
Total Protein: 6.7 g/dL (ref 6.0–8.5)

## 2020-04-02 LAB — CBC
Hematocrit: 43.2 % (ref 37.5–51.0)
Hemoglobin: 15.4 g/dL (ref 13.0–17.7)
MCH: 31.8 pg (ref 26.6–33.0)
MCHC: 35.6 g/dL (ref 31.5–35.7)
MCV: 89 fL (ref 79–97)
Platelets: 306 10*3/uL (ref 150–450)
RBC: 4.84 x10E6/uL (ref 4.14–5.80)
RDW: 11.9 % (ref 11.6–15.4)
WBC: 9 10*3/uL (ref 3.4–10.8)

## 2020-04-02 LAB — TSH: TSH: 0.577 u[IU]/mL (ref 0.450–4.500)

## 2020-04-08 ENCOUNTER — Other Ambulatory Visit: Payer: Self-pay | Admitting: Cardiovascular Disease

## 2020-04-08 DIAGNOSIS — E785 Hyperlipidemia, unspecified: Secondary | ICD-10-CM

## 2020-04-09 ENCOUNTER — Other Ambulatory Visit: Payer: Self-pay

## 2020-04-09 MED ORDER — EZETIMIBE 10 MG PO TABS
10.0000 mg | ORAL_TABLET | Freq: Every day | ORAL | 3 refills | Status: DC
Start: 2020-04-09 — End: 2021-03-31

## 2020-04-09 NOTE — Progress Notes (Signed)
Prescription for zetia 10mg  sent to preferred pharmacy.

## 2020-04-26 ENCOUNTER — Other Ambulatory Visit: Payer: Self-pay

## 2020-04-26 ENCOUNTER — Ambulatory Visit: Payer: BC Managed Care – PPO | Admitting: Family Medicine

## 2020-04-26 ENCOUNTER — Encounter: Payer: Self-pay | Admitting: Family Medicine

## 2020-04-26 VITALS — BP 138/90 | HR 57 | Temp 97.2°F | Ht 72.5 in | Wt 236.0 lb

## 2020-04-26 DIAGNOSIS — E785 Hyperlipidemia, unspecified: Secondary | ICD-10-CM

## 2020-04-26 DIAGNOSIS — Z23 Encounter for immunization: Secondary | ICD-10-CM | POA: Diagnosis not present

## 2020-04-26 DIAGNOSIS — Z833 Family history of diabetes mellitus: Secondary | ICD-10-CM

## 2020-04-26 DIAGNOSIS — J309 Allergic rhinitis, unspecified: Secondary | ICD-10-CM

## 2020-04-26 DIAGNOSIS — R7309 Other abnormal glucose: Secondary | ICD-10-CM | POA: Diagnosis not present

## 2020-04-26 DIAGNOSIS — G4730 Sleep apnea, unspecified: Secondary | ICD-10-CM

## 2020-04-26 DIAGNOSIS — F17200 Nicotine dependence, unspecified, uncomplicated: Secondary | ICD-10-CM

## 2020-04-26 DIAGNOSIS — Z8042 Family history of malignant neoplasm of prostate: Secondary | ICD-10-CM

## 2020-04-26 DIAGNOSIS — E669 Obesity, unspecified: Secondary | ICD-10-CM

## 2020-04-26 DIAGNOSIS — Z0000 Encounter for general adult medical examination without abnormal findings: Secondary | ICD-10-CM | POA: Diagnosis not present

## 2020-04-26 DIAGNOSIS — I10 Essential (primary) hypertension: Secondary | ICD-10-CM

## 2020-04-26 DIAGNOSIS — I350 Nonrheumatic aortic (valve) stenosis: Secondary | ICD-10-CM

## 2020-04-26 DIAGNOSIS — E7439 Other disorders of intestinal carbohydrate absorption: Secondary | ICD-10-CM | POA: Insufficient documentation

## 2020-04-26 DIAGNOSIS — Z86010 Personal history of colonic polyps: Secondary | ICD-10-CM

## 2020-04-26 DIAGNOSIS — N529 Male erectile dysfunction, unspecified: Secondary | ICD-10-CM

## 2020-04-26 LAB — POCT GLYCOSYLATED HEMOGLOBIN (HGB A1C): Hemoglobin A1C: 5.9 % — AB (ref 4.0–5.6)

## 2020-04-26 MED ORDER — TADALAFIL 20 MG PO TABS: 20.0000 mg | ORAL_TABLET | Freq: Every day | ORAL | 2 refills | Status: DC | PRN

## 2020-04-26 MED ORDER — AMLODIPINE BESYLATE 5 MG PO TABS: 5.0000 mg | ORAL_TABLET | Freq: Every day | ORAL | 3 refills | Status: DC

## 2020-04-26 MED ORDER — LISINOPRIL-HYDROCHLOROTHIAZIDE 20-12.5 MG PO TABS: 1.0000 | ORAL_TABLET | Freq: Every day | ORAL | 3 refills | Status: DC

## 2020-04-26 NOTE — Progress Notes (Signed)
   Subjective:    Patient ID: Chris Skinner, male    DOB: 09-21-1965, 54 y.o.   MRN: [id number]  HPI He is here for complete examination.  He does have underlying aortic stenosis and recently saw cardiology.  Zetia was added to his regimen.  He has not had any chest pain, shortness of breath or syncopal episodes.  He continues to do quite nicely on his CPAP.  He can definitely tell if he skips a day.  He continues on his blood pressure medication.  He also has history of colonic polyps and is 1 year behind getting his colonoscopy.  He smokes usually less than 5 cigarettes/day and has no plans to quit.  He has had some difficulty with erectile dysfunction, getting and maintaining erections and would like some help with that.  There is a family history of prostate cancer as well as diabetes.  He does have underlying allergies and uses Zyrtec on an as-needed basis.  He continues to teach.  He has retired from the [employer] and is now working in [place] and plans to do so for the next several years.  Family and social history as well as health maintenance and immunizations was reviewed.  Recent blood work was reviewed   Review of Systems  All other systems reviewed and are negative.      Objective:   Physical Exam Alert and in no distress. Tympanic membranes and canals are normal. Pharyngeal area is normal. Neck is supple without adenopathy or thyromegaly. Cardiac exam shows a regular sinus rhythm 3/6 SEM heard in the aortic area,no gallops. Lungs are clear to auscultation. Abdominal exam shows no masses or tenderness with normal bowel sounds. Recent blood work did show an elevated blood sugar and his A1c is 5.9       Assessment & Plan:  Routine general medical examination at a health care facility  Dyslipidemia, goal LDL below 100  Moderate aortic stenosis  Sleep apnea, unspecified type  History of colonic polyps - Plan: Ambulatory referral to Gastroenterology  Erectile  dysfunction, unspecified erectile dysfunction type - Plan: tadalafil (CIALIS) 20 MG tablet  Smoker  Uncontrolled hypertension  Family history of prostate cancer in father - Plan: PSA  Glucose intolerance - Plan: POCT glycosylated hemoglobin (Hb A1C)  Obesity (BMI 30.0-34.9)  Family history of diabetes mellitus  Need for Tdap vaccination - Plan: Tdap vaccine greater than or equal to 7yo IM  Allergic rhinitis, unspecified seasonality, unspecified trigger He will continue his present blood pressure medication and recheck that in about 2 months.  We will also recheck this lipids at that time. Discussed diet and exercise with him specifically cutting back on carbohydrates including alcohol.  Discussed glucose intolerance and risk of diabetes. Briefly discussed prostate cancer screening. Although he is doing well on his CPAP did recommend he get a readout. Continue to be followed by cardiology.

## 2020-04-26 NOTE — Patient Instructions (Signed)
Its time to make some dietary changes specifically cutting back on alcohol consumption and carbohydrates.

## 2020-04-27 LAB — PSA: Prostate Specific Ag, Serum: 0.6 ng/mL (ref 0.0–4.0)

## 2020-05-04 ENCOUNTER — Other Ambulatory Visit: Payer: BC Managed Care – PPO

## 2020-05-12 ENCOUNTER — Telehealth: Payer: Self-pay

## 2020-05-12 DIAGNOSIS — N529 Male erectile dysfunction, unspecified: Secondary | ICD-10-CM

## 2020-05-12 NOTE — Telephone Encounter (Signed)
Pt. Called stating he was in here a few weeks ago and you sent in Tadalafil in for him but it was not covered by his insurance and he used the good rx card and it is cheaper if he gets it at the [facility] on [address]. In [place] if you could send it there.

## 2020-05-13 MED ORDER — TADALAFIL 20 MG PO TABS: 20.0000 mg | ORAL_TABLET | Freq: Every day | ORAL | 2 refills | Status: DC | PRN

## 2020-05-13 NOTE — Telephone Encounter (Signed)
done

## 2020-05-24 ENCOUNTER — Other Ambulatory Visit: Payer: BC Managed Care – PPO

## 2020-05-24 DIAGNOSIS — Z20822 Contact with and (suspected) exposure to covid-19: Secondary | ICD-10-CM

## 2020-05-25 ENCOUNTER — Encounter: Payer: BC Managed Care – PPO | Admitting: Family Medicine

## 2020-05-26 LAB — NOVEL CORONAVIRUS, NAA: SARS-CoV-2, NAA: NOT DETECTED

## 2020-05-26 LAB — SARS-COV-2, NAA 2 DAY TAT

## 2020-05-26 LAB — SPECIMEN STATUS REPORT

## 2020-06-10 ENCOUNTER — Other Ambulatory Visit: Payer: BC Managed Care – PPO

## 2020-06-29 ENCOUNTER — Encounter: Payer: Self-pay | Admitting: Family Medicine

## 2020-06-29 ENCOUNTER — Ambulatory Visit: Payer: BC Managed Care – PPO | Admitting: Family Medicine

## 2020-06-29 ENCOUNTER — Other Ambulatory Visit: Payer: Self-pay

## 2020-06-29 VITALS — BP 128/86 | HR 58 | Temp 97.0°F | Wt 238.2 lb

## 2020-06-29 DIAGNOSIS — E785 Hyperlipidemia, unspecified: Secondary | ICD-10-CM

## 2020-06-29 DIAGNOSIS — Z23 Encounter for immunization: Secondary | ICD-10-CM | POA: Diagnosis not present

## 2020-06-29 NOTE — Progress Notes (Signed)
° °  Subjective:    Patient ID: Chris Skinner, male    DOB: Dec 29, 1965, 54 y.o.   MRN: [id number]  HPI He is here for a recheck.  He is now taking Crestor as well as Zetia.  He unfortunately ate breakfast this morning so we will have to hold off on doing blood work.  He continues on his blood pressure medications and is having no difficulty with them. He has had them during the vaccine and is interested in getting the booster.  Review of Systems     Objective:   Physical Exam  Alert and in no distress.  Blood pressure is recorded.     Assessment & Plan:  Immunization, viral disease - Plan: Moderna SARS-CoV-2 Vaccine  Dyslipidemia, goal LDL below 100 - Plan: CANCELED: Lipid panel The criteria for the booster were discussed with him.  He is slightly early based on FDA and CDC recommendations of but he is comfortable with that. Follow-up in several months concerning his lipids.

## 2020-08-21 ENCOUNTER — Emergency Department
Admission: EM | Admit: 2020-08-21 | Discharge: 2020-08-21 | Disposition: A | Discharge status: Home / Self Care | Payer: BC Managed Care – PPO | Source: Home / Self Care

## 2020-08-21 ENCOUNTER — Emergency Department (INDEPENDENT_AMBULATORY_CARE_PROVIDER_SITE_OTHER): Payer: BC Managed Care – PPO

## 2020-08-21 ENCOUNTER — Encounter: Payer: Self-pay | Admitting: Emergency Medicine

## 2020-08-21 ENCOUNTER — Other Ambulatory Visit: Payer: Self-pay

## 2020-08-21 DIAGNOSIS — W010XXA Fall on same level from slipping, tripping and stumbling without subsequent striking against object, initial encounter: Secondary | ICD-10-CM

## 2020-08-21 DIAGNOSIS — S0003XA Contusion of scalp, initial encounter: Secondary | ICD-10-CM

## 2020-08-21 DIAGNOSIS — T07XXXA Unspecified multiple injuries, initial encounter: Secondary | ICD-10-CM

## 2020-08-21 DIAGNOSIS — S0081XA Abrasion of other part of head, initial encounter: Secondary | ICD-10-CM | POA: Diagnosis not present

## 2020-08-21 MED ORDER — CEPHALEXIN 500 MG PO CAPS: 500.0000 mg | ORAL_CAPSULE | Freq: Two times a day (BID) | ORAL | 0 refills | Status: AC

## 2020-08-21 NOTE — ED Triage Notes (Signed)
Patient tripped over tree trunk last night and fell face first into gravel area; had eyeglasses on which shattered but lenses intact. Took aleve at 0800. Has had covid vaccinations.

## 2020-08-21 NOTE — ED Provider Notes (Signed)
[facility]    CSN: [id number] Arrival date & time: 08/21/20  0946      History   Chief Complaint Chief Complaint  Patient presents with  . Facial Laceration    HPI Chris Skinner is a 54 y.o. male.   HPI Chris Skinner is a 54 y.o. male presenting to UC with c/o multiple abrasions on his face after tripping over a tree stump last night and falling into gravel. Denies LOC. Denies HA, dizziness, change in vision, nausea or vomiting.  He took Aleve at 8AM this morning, moderate relief. Pt wants to be evaluated for wounds and swelling of his forehead. He is not on blood thinners.    Past Medical History:  Diagnosis Date  . Chest pain 2001   [facility], normal cardiac workup  . Dyslipidemia   . H/O echocardiogram 1/15   PFO found incidentally.  Echo done due to murmur  . Hyperlipidemia   . Hypertension   . Personal history of colonic polyp - adenoma 01/05/2014  . Tobacco use disorder   . Wears glasses     Patient Active Problem List   Diagnosis Date Noted  . Smoker 04/26/2020  . Erectile dysfunction 04/26/2020  . Obesity (BMI 30.0-34.9) 04/26/2020  . Glucose intolerance 04/26/2020  . Family history of prostate cancer in father 04/26/2020  . Family history of diabetes mellitus 04/26/2020  . Moderate aortic stenosis 04/23/2019  . Sleep apnea 04/23/2019  . History of colonic polyps 01/05/2014  . Dyslipidemia, goal LDL below 100 04/16/2012  . Uncontrolled hypertension 04/16/2012    Past Surgical History:  Procedure Laterality Date  . COLONOSCOPY  2015  . KNEE ARTHROSCOPY Left 1985  . TONSILLECTOMY AND ADENOIDECTOMY  1974       Home Medications    Prior to Admission medications   Medication Sig Start Date End Date Taking? Authorizing Provider  amLODipine (NORVASC) 5 MG tablet Take 1 tablet (5 mg total) by mouth daily. 04/26/20   Denita Lung, MD  cephALEXin (KEFLEX) 500 MG capsule Take 1 capsule (500 mg total) by mouth 2 (two) times daily for 7  days. 08/21/20 08/28/20  Noe Gens, PA-C  Cetirizine HCl (ZYRTEC PO) Take by mouth.    [provider]  ezetimibe (ZETIA) 10 MG tablet Take 1 tablet (10 mg total) by mouth daily. 04/09/20   Troy Sine, MD  lisinopril-hydrochlorothiazide (ZESTORETIC) 20-12.5 MG tablet Take 1 tablet by mouth daily. 04/26/20   Denita Lung, MD  Naproxen Sodium (ALEVE PO) Take by mouth.    [provider]  Omega-3 Fatty Acids (FISH OIL OMEGA-3 PO) Take by mouth.    [provider]  rosuvastatin (CRESTOR) 40 MG tablet TAKE 1 TABLET (40 MG TOTAL) DAILY BY MOUTH. 04/08/20   Troy Sine, MD  tadalafil (CIALIS) 20 MG tablet Take 1 tablet (20 mg total) by mouth daily as needed for erectile dysfunction. 05/13/20   Denita Lung, MD    Family History Family History  Problem Relation Age of Onset  . Hyperlipidemia Mother   . Heart disease Mother   . Arthritis Mother   . Stroke Mother   . Heart disease Father   . Diabetes Father        borderline  . Crohn's disease Sister   . Diabetes Paternal Grandfather   . Cancer Neg Hx   . Colon cancer Neg Hx     Social History Social History   Tobacco Use  . Smoking  status: Current Every Day Smoker    Last attempt to quit: 09/22/2013    Years since quitting: 6.9  . Smokeless tobacco: Former Systems developer    Types: Chew    Quit date: 2009  Substance Use Topics  . Alcohol use: Yes    Alcohol/week: 10.0 standard drinks    Types: 10 Cans of beer per week  . Drug use: No     Allergies   Patient has no known allergies.   Review of Systems Review of Systems  HENT: Positive for facial swelling.   Eyes: Negative for photophobia and visual disturbance.  Musculoskeletal: Negative for neck pain and neck stiffness.  Skin: Positive for color change and wound.  Neurological: Negative for dizziness, light-headedness and headaches.     Physical Exam Triage Vital Signs ED Triage Vitals  Enc Vitals Group     BP 08/21/20 1043 133/83      Pulse Rate 08/21/20 1043 67     Resp 08/21/20 1043 16     Temp 08/21/20 1043 97.9 F (36.6 C)     Temp Source 08/21/20 1043 Oral     SpO2 08/21/20 1043 96 %     Weight 08/21/20 1044 235 lb (106.6 kg)     Height 08/21/20 1044 6\' 1"  (1.854 m)     Head Circumference --      Peak Flow --      Pain Score 08/21/20 1044 1     Pain Loc --      Pain Edu? --      Excl. in [place]? --    No data found.  Updated Vital Signs BP 133/83 (BP Location: Left Arm)   Pulse 67   Temp 97.9 F (36.6 C) (Oral)   Resp 16   Ht 6\' 1"  (1.854 m)   Wt 235 lb (106.6 kg)   SpO2 96%   BMI 31.00 kg/m   Visual Acuity Right Eye Distance:   Left Eye Distance:   Bilateral Distance:    Right Eye Near:   Left Eye Near:    Bilateral Near:     Physical Exam Vitals and nursing note reviewed.  Constitutional:      General: He is not in acute distress.    Appearance: Normal appearance. He is well-developed and well-nourished. He is not ill-appearing, toxic-appearing or diaphoretic.  HENT:     Head: Normocephalic. Abrasion, contusion and laceration present.      Right Ear: Tympanic membrane and ear canal normal.     Left Ear: Tympanic membrane and ear canal normal.     Nose: Nose normal.     Right Sinus: No maxillary sinus tenderness or frontal sinus tenderness.     Left Sinus: No maxillary sinus tenderness or frontal sinus tenderness.     Mouth/Throat:     Lips: Pink.     Mouth: Mucous membranes are moist. No injury.     Pharynx: Oropharynx is clear. Uvula midline.  Eyes:     Extraocular Movements: Extraocular movements intact and EOM normal.     Pupils: Pupils are equal, round, and reactive to light.  Cardiovascular:     Rate and Rhythm: Normal rate and regular rhythm.  Pulmonary:     Effort: Pulmonary effort is normal. No respiratory distress.     Breath sounds: Normal breath sounds.  Musculoskeletal:        General: Normal range of motion.     Cervical back: Normal range of motion and neck supple.  No rigidity or  tenderness.  Skin:    General: Skin is warm and dry.  Neurological:     General: No focal deficit present.     Mental Status: He is alert and oriented to person, place, and time.  Psychiatric:        Mood and Affect: Mood and affect and mood normal.        Behavior: Behavior normal.      UC Treatments / Results  Labs (all labs ordered are listed, but only abnormal results are displayed) Labs Reviewed - No data to display  EKG   Radiology CT Maxillofacial Wo Contrast  Result Date: 08/21/2020 CLINICAL DATA:  54 year old male status post fall, multiple facial lacerations. EXAM: CT MAXILLOFACIAL WITHOUT CONTRAST TECHNIQUE: Multidetector CT imaging of the maxillofacial structures was performed. Multiplanar CT image reconstructions were also generated. COMPARISON:  None. FINDINGS: Osseous: No fracture or mandibular dislocation. No destructive process. Multilevel degenerative changes of the cervical spine. Orbits: Negative. No traumatic or inflammatory finding. Sinuses: Trace fluid in the frontal and ethmoid sinuses. The sphenoid and maxillary sinuses are clear. Soft tissues: Mild midline frontal scalp hematoma measuring up to approximately 5 mm in maximum thickness anterior to the frontal sinus. There are few scattered focal radiopaque foreign bodies within the cutaneous tissues along the frontal sinus and superior nasal region. No subcutaneous radiopaque foreign bodies. Limited intracranial: No significant or unexpected finding. IMPRESSION: 1. No evidence of facial fracture. 2. Mild frontal scalp hematoma measuring up to 5 mm in thickness. No subcutaneous radiopaque foreign bodies. Electronically Signed   By: Ruthann Cancer MD   On: 08/21/2020 11:19    Procedures Procedures (including critical care time)  Medications Ordered in UC Medications - No data to display  Initial Impression / Assessment and Plan / UC Course  I have reviewed the triage vital signs and the nursing  notes.  Pertinent labs & imaging results that were available during my care of the patient were reviewed by me and considered in my medical decision making (see chart for details).     Tdap UTD 8/21.  Normal neuro exam No intracranial findings Due to multiple abrasions from gravel, will start prophylaxis keflex Home instructions provided AVS given  Final Clinical Impressions(s) / UC Diagnoses   Final diagnoses:  Scalp hematoma, initial encounter  Fall from slip, trip, or stumble, initial encounter  Multiple abrasions     Discharge Instructions      Keep wounds clean with warm water and mild soap. Pat dry. You may apply a thin layer of over the counter antibiotic ointment the first 3-4 days to help healing.   You can apply a cool compress 2-3 times daily to help with pain and inflammation. Follow up in 4-5 days for a wound recheck as needed. Take the oral antibiotic, cephalexin, to help prevent infection.     ED Prescriptions    Medication Sig Dispense Auth. Provider   cephALEXin (KEFLEX) 500 MG capsule Take 1 capsule (500 mg total) by mouth 2 (two) times daily for 7 days. 14 capsule Noe Gens, [place]     PDMP not reviewed this encounter.   Noe Gens, [place] 08/22/20 1552

## 2020-08-21 NOTE — ED Triage Notes (Signed)
Tdap 8/21.

## 2020-08-21 NOTE — Discharge Instructions (Signed)
  Keep wounds clean with warm water and mild soap. Pat dry. You may apply a thin layer of over the counter antibiotic ointment the first 3-4 days to help healing.   You can apply a cool compress 2-3 times daily to help with pain and inflammation. Follow up in 4-5 days for a wound recheck as needed. Take the oral antibiotic, cephalexin, to help prevent infection.

## 2020-10-07 ENCOUNTER — Other Ambulatory Visit: Payer: Self-pay | Admitting: Cardiovascular Disease

## 2020-10-07 DIAGNOSIS — E785 Hyperlipidemia, unspecified: Secondary | ICD-10-CM

## 2021-01-17 ENCOUNTER — Encounter (HOSPITAL_COMMUNITY): Payer: Self-pay

## 2021-01-17 ENCOUNTER — Ambulatory Visit: Payer: BC Managed Care – PPO | Admitting: Family Medicine

## 2021-01-17 ENCOUNTER — Emergency Department (HOSPITAL_COMMUNITY): Payer: BC Managed Care – PPO | Admitting: Certified Registered"

## 2021-01-17 ENCOUNTER — Ambulatory Visit (HOSPITAL_COMMUNITY)
Admission: EM | Admit: 2021-01-17 | Discharge: 2021-01-17 | Disposition: A | Discharge status: Home / Self Care | Payer: BC Managed Care – PPO | Attending: Emergency Medicine | Admitting: Emergency Medicine

## 2021-01-17 ENCOUNTER — Encounter: Payer: Self-pay | Admitting: Family Medicine

## 2021-01-17 ENCOUNTER — Other Ambulatory Visit: Payer: Self-pay

## 2021-01-17 ENCOUNTER — Encounter (HOSPITAL_COMMUNITY)
Admission: EM | Disposition: A | Discharge status: Home / Self Care | Payer: Self-pay | Source: Home / Self Care | Attending: Emergency Medicine

## 2021-01-17 VITALS — BP 122/86 | HR 62 | Temp 97.5°F | Ht 72.5 in | Wt 235.2 lb

## 2021-01-17 DIAGNOSIS — F17200 Nicotine dependence, unspecified, uncomplicated: Secondary | ICD-10-CM | POA: Insufficient documentation

## 2021-01-17 DIAGNOSIS — I350 Nonrheumatic aortic (valve) stenosis: Secondary | ICD-10-CM

## 2021-01-17 DIAGNOSIS — Z79899 Other long term (current) drug therapy: Secondary | ICD-10-CM | POA: Insufficient documentation

## 2021-01-17 DIAGNOSIS — X58XXXA Exposure to other specified factors, initial encounter: Secondary | ICD-10-CM | POA: Insufficient documentation

## 2021-01-17 DIAGNOSIS — K222 Esophageal obstruction: Secondary | ICD-10-CM

## 2021-01-17 DIAGNOSIS — R1310 Dysphagia, unspecified: Secondary | ICD-10-CM | POA: Diagnosis not present

## 2021-01-17 DIAGNOSIS — Z86010 Personal history of colonic polyps: Secondary | ICD-10-CM

## 2021-01-17 DIAGNOSIS — T18128A Food in esophagus causing other injury, initial encounter: Secondary | ICD-10-CM | POA: Diagnosis not present

## 2021-01-17 DIAGNOSIS — Z20822 Contact with and (suspected) exposure to covid-19: Secondary | ICD-10-CM | POA: Insufficient documentation

## 2021-01-17 DIAGNOSIS — R1314 Dysphagia, pharyngoesophageal phase: Secondary | ICD-10-CM

## 2021-01-17 DIAGNOSIS — R1319 Other dysphagia: Secondary | ICD-10-CM | POA: Diagnosis not present

## 2021-01-17 DIAGNOSIS — Z8719 Personal history of other diseases of the digestive system: Secondary | ICD-10-CM | POA: Diagnosis not present

## 2021-01-17 DIAGNOSIS — K56699 Other intestinal obstruction unspecified as to partial versus complete obstruction: Secondary | ICD-10-CM

## 2021-01-17 HISTORY — PX: FOREIGN BODY REMOVAL: SHX962

## 2021-01-17 HISTORY — PX: BIOPSY: SHX5522

## 2021-01-17 HISTORY — PX: ESOPHAGOGASTRODUODENOSCOPY (EGD) WITH PROPOFOL: SHX5813

## 2021-01-17 LAB — CBC WITH DIFFERENTIAL/PLATELET
Abs Immature Granulocytes: 0.03 10*3/uL (ref 0.00–0.07)
Basophils Absolute: 0 10*3/uL (ref 0.0–0.1)
Basophils Relative: 0 %
Eosinophils Absolute: 0.4 10*3/uL (ref 0.0–0.5)
Eosinophils Relative: 3 %
HCT: 44.6 % (ref 39.0–52.0)
Hemoglobin: 16 g/dL (ref 13.0–17.0)
Immature Granulocytes: 0 %
Lymphocytes Relative: 24 %
Lymphs Abs: 3.1 10*3/uL (ref 0.7–4.0)
MCH: 32.4 pg (ref 26.0–34.0)
MCHC: 35.9 g/dL (ref 30.0–36.0)
MCV: 90.3 fL (ref 80.0–100.0)
Monocytes Absolute: 0.9 10*3/uL (ref 0.1–1.0)
Monocytes Relative: 7 %
Neutro Abs: 8.7 10*3/uL — ABNORMAL HIGH (ref 1.7–7.7)
Neutrophils Relative %: 66 %
Platelets: 323 10*3/uL (ref 150–400)
RBC: 4.94 MIL/uL (ref 4.22–5.81)
RDW: 11.2 % — ABNORMAL LOW (ref 11.5–15.5)
WBC: 13.2 10*3/uL — ABNORMAL HIGH (ref 4.0–10.5)
nRBC: 0 % (ref 0.0–0.2)

## 2021-01-17 LAB — COMPREHENSIVE METABOLIC PANEL
ALT: 42 U/L (ref 0–44)
AST: 31 U/L (ref 15–41)
Albumin: 4.2 g/dL (ref 3.5–5.0)
Alkaline Phosphatase: 47 U/L (ref 38–126)
Anion gap: 9 (ref 5–15)
BUN: 17 mg/dL (ref 6–20)
CO2: 23 mmol/L (ref 22–32)
Calcium: 9.2 mg/dL (ref 8.9–10.3)
Chloride: 104 mmol/L (ref 98–111)
Creatinine, Ser: 0.79 mg/dL (ref 0.61–1.24)
GFR, Estimated: >60 mL/min (ref >60)
Glucose, Bld: 112 mg/dL — ABNORMAL HIGH (ref 70–99)
Potassium: 3.9 mmol/L (ref 3.5–5.1)
Sodium: 136 mmol/L (ref 135–145)
Total Bilirubin: 1.2 mg/dL (ref 0.3–1.2)
Total Protein: 7.1 g/dL (ref 6.5–8.1)

## 2021-01-17 LAB — RESP PANEL BY RT-PCR (FLU A&B, COVID) ARPGX2
Influenza A by PCR: NEGATIVE
Influenza B by PCR: NEGATIVE
SARS Coronavirus 2 by RT PCR: NEGATIVE

## 2021-01-17 LAB — LIPASE, BLOOD: Lipase: 42 U/L (ref 11–51)

## 2021-01-17 SURGERY — ESOPHAGOGASTRODUODENOSCOPY (EGD) WITH PROPOFOL
Anesthesia: General

## 2021-01-17 MED ORDER — ONDANSETRON HCL 4 MG/2ML IJ SOLN
INTRAMUSCULAR | Status: DC | PRN
  Administered 2021-01-17: 4 mg via INTRAVENOUS

## 2021-01-17 MED ORDER — ROCURONIUM BROMIDE 10 MG/ML (PF) SYRINGE
PREFILLED_SYRINGE | INTRAVENOUS | Status: DC | PRN
  Administered 2021-01-17: 20 mg via INTRAVENOUS

## 2021-01-17 MED ORDER — LACTATED RINGERS IV SOLN: INTRAVENOUS | Status: DC | PRN

## 2021-01-17 MED ORDER — SUGAMMADEX SODIUM 200 MG/2ML IV SOLN
INTRAVENOUS | Status: DC | PRN
  Administered 2021-01-17: 180 mg via INTRAVENOUS

## 2021-01-17 MED ORDER — EPHEDRINE SULFATE-NACL 50-0.9 MG/10ML-% IV SOSY
PREFILLED_SYRINGE | INTRAVENOUS | Status: DC | PRN
  Administered 2021-01-17: 5 mg via INTRAVENOUS

## 2021-01-17 MED ORDER — SUCCINYLCHOLINE CHLORIDE 20 MG/ML IJ SOLN
INTRAMUSCULAR | Status: DC | PRN
  Administered 2021-01-17: 100 mg via INTRAVENOUS

## 2021-01-17 MED ORDER — MIDAZOLAM HCL 5 MG/5ML IJ SOLN
INTRAMUSCULAR | Status: DC | PRN
  Administered 2021-01-17: 2 mg via INTRAVENOUS

## 2021-01-17 MED ORDER — FENTANYL CITRATE (PF) 100 MCG/2ML IJ SOLN
INTRAMUSCULAR | Status: DC | PRN
  Administered 2021-01-17: 50 ug via INTRAVENOUS

## 2021-01-17 MED ORDER — LIDOCAINE 2% (20 MG/ML) 5 ML SYRINGE
INTRAMUSCULAR | Status: DC | PRN
  Administered 2021-01-17: 100 mg via INTRAVENOUS

## 2021-01-17 MED ORDER — PROPOFOL 10 MG/ML IV BOLUS
INTRAVENOUS | Status: DC | PRN
  Administered 2021-01-17: 180 mg via INTRAVENOUS

## 2021-01-17 SURGICAL SUPPLY — 15 items

## 2021-01-17 NOTE — Progress Notes (Signed)
Call from ED. Patient here with food impaction ( ribs). He can handle secretion but not any fluids. COVID19  test pending. Will contact Endoscopy team to get a time for EGD for removal of food bolus

## 2021-01-17 NOTE — Anesthesia Procedure Notes (Signed)
Procedure Name: Intubation Date/Time: 01/17/2021 4:30 PM Performed by: Lavell Luster, CRNA Pre-anesthesia Checklist: Patient identified, Emergency Drugs available, Suction available, Patient being monitored and Timeout performed Patient Re-evaluated:Patient Re-evaluated prior to induction Oxygen Delivery Method: Circle system utilized Preoxygenation: Pre-oxygenation with 100% oxygen Induction Type: IV induction, Cricoid Pressure applied and Rapid sequence Laryngoscope Size: Mac, 4 and Glidescope Grade View: Grade I Tube type: Oral Tube size: 7.5 mm Number of attempts: 1 Airway Equipment and Method: Stylet and Video-laryngoscopy Placement Confirmation: breath sounds checked- equal and bilateral,  positive ETCO2 and ETT inserted through vocal cords under direct vision Secured at: 22 cm Tube secured with: Tape Dental Injury: Teeth and Oropharynx as per pre-operative assessment

## 2021-01-17 NOTE — Transfer of Care (Signed)
Immediate Anesthesia Transfer of Care Note  Patient: Chris Skinner  Procedure(s) Performed: ESOPHAGOGASTRODUODENOSCOPY (EGD) WITH PROPOFOL (N/A ) BIOPSY  Patient Location: PACU  Anesthesia Type:General  Level of Consciousness: drowsy and patient cooperative  Airway & Oxygen Therapy: Patient Spontanous Breathing and Patient connected to face mask oxygen  Post-op Assessment: Report given to RN and Post -op Vital signs reviewed and stable  Post vital signs: Reviewed and stable  Last Vitals:  Vitals Value Taken Time  BP 140/100 01/17/21 1704  Temp 36.7 C 01/17/21 1655  Pulse 72 01/17/21 1705  Resp 16 01/17/21 1705  SpO2 96 % 01/17/21 1705  Vitals shown include unvalidated device data.  Last Pain:  Vitals:   01/17/21 1655  TempSrc: Oral  PainSc: 0-No pain         Complications: No complications documented.

## 2021-01-17 NOTE — H&P (View-Only) (Signed)
Referring Provider:  [facility]         Primary Care Physician:  Denita Lung, MD Primary Gastroenterologist: Silvano Rusk, MD             We were asked to see this patient for:     Food impaction             ASSESSMENT / PLAN:   # 55 yo male with intermittent solid food dysphagia over last ~ 4 years. In ED with food impaction which occurred last night while eating ribs.  --Awaiting COVID19 results then will proceed with EGD with extraction of food bolus. The risks and benefits of EGD with possible biopsies was discussed with the patient and they agree to proceed.   # Loud murmur / Aortic stenosis  # History of adenomatous colon polyps. Two small polyps < 10 mm removed in  April 2015. A 7 to 10 year interval colonoscopy would be recommended based on current surveillance guidelines. This can be addressed as outpatient.     Attending Physician Note   I have taken a history, examined the patient and reviewed the chart. I agree with the Advanced Practitioner's note, impression and recommendations.  * Acute food impaction last night while eating ribs. Intermittent, mild solid food dysphagia for several years. History of ulcerative esophagitis.  * Aortic stenosis, moderate and stable, per last echocardiogram in July 2021   * Urgent EGD today to remove food impaction, assess for cause of dysphagia   [name], MD [facility] [phone]       HPI:                                                                                                                             Chief Complaint:  Food stuck in esophagus  Chris Skinner is a 55 y.o. male with a past medical history of moderate aortic stenosis,  Ulcerative esophagitis (remote), adenomatous colon polyps, HTN, hyperlipidemia and diverticulosis  Patient saw PCP today for swallowing difficulty. He was sent to ED for evaluation. Patient has been having intermittent solid food dysphagia for about 4 years but has never  had an impaction. He thinks rib meat is stuck in esophagus from dinner last night. Cannot swallow water but handing secretions. No chest pain or odynophagia. Patient gives a very remote history of ulcerative esophagitis. He denies any GERD symptoms.  No other GI complaints  In ED a CMP is unremarkable. CBC remarkable for WBC of 13.2  PREVIOUS ENDOSCOPIC EVALUATIONS / PERTINENT STUDIES   April 2015 screening colonoscopy  --complete exam, good prep --ENDOSCOPIC IMPRESSION: 1. Two diminutive sessile polyps were found at the splenic flexure and in the sigmoid colon; polypectomy was performed with cold forceps 2. Mild diverticulosis was noted - otherwise normal colon, good prep - first exam  Surgical [P], splenic flexure, sigmoid, polyp (2) - TUBULAR ADENOMA. - PERINEURIOMA. - HIGH GRADE DYSPLASIA IS NOT IDENTIFIED. - SEE COMMENT.  Past Medical History:  Diagnosis Date  . Chest pain 2001   [facility], normal cardiac workup  . Dyslipidemia   . H/O echocardiogram 1/15   PFO found incidentally.  Echo done due to murmur  . Hyperlipidemia   . Hypertension   . Personal history of colonic polyp - adenoma 01/05/2014  . Tobacco use disorder   . Wears glasses     Past Surgical History:  Procedure Laterality Date  . COLONOSCOPY  2015  . KNEE ARTHROSCOPY Left 1985  . TONSILLECTOMY AND ADENOIDECTOMY  1974    Prior to Admission medications   Medication Sig Start Date End Date Taking? Authorizing Provider  amLODipine (NORVASC) 5 MG tablet Take 1 tablet (5 mg total) by mouth daily. 04/26/20   Denita Lung, MD  Cetirizine HCl (ZYRTEC PO) Take by mouth.    [provider]  ezetimibe (ZETIA) 10 MG tablet Take 1 tablet (10 mg total) by mouth daily. 04/09/20   Troy Sine, MD  lisinopril-hydrochlorothiazide (ZESTORETIC) 20-12.5 MG tablet Take 1 tablet by mouth daily. 04/26/20   Denita Lung, MD  Naproxen Sodium (ALEVE PO) Take by mouth.    [provider]  Omega-3 Fatty  Acids (FISH OIL OMEGA-3 PO) Take by mouth.    [provider]  rosuvastatin (CRESTOR) 40 MG tablet TAKE 1 TABLET (40 MG TOTAL) DAILY BY MOUTH. 10/07/20   Troy Sine, MD  tadalafil (CIALIS) 20 MG tablet Take 1 tablet (20 mg total) by mouth daily as needed for erectile dysfunction. 05/13/20   Denita Lung, MD    No current facility-administered medications for this encounter.   Current Outpatient Medications  Medication Sig Dispense Refill  . amLODipine (NORVASC) 5 MG tablet Take 1 tablet (5 mg total) by mouth daily. 90 tablet 3  . Cetirizine HCl (ZYRTEC PO) Take by mouth.    . ezetimibe (ZETIA) 10 MG tablet Take 1 tablet (10 mg total) by mouth daily. 90 tablet 3  . lisinopril-hydrochlorothiazide (ZESTORETIC) 20-12.5 MG tablet Take 1 tablet by mouth daily. 90 tablet 3  . Naproxen Sodium (ALEVE PO) Take by mouth.    . Omega-3 Fatty Acids (FISH OIL OMEGA-3 PO) Take by mouth.    . rosuvastatin (CRESTOR) 40 MG tablet TAKE 1 TABLET (40 MG TOTAL) DAILY BY MOUTH. 90 tablet 1  . tadalafil (CIALIS) 20 MG tablet Take 1 tablet (20 mg total) by mouth daily as needed for erectile dysfunction. 30 tablet 2    Allergies as of 01/17/2021  . (No Known Allergies)    Family History  Problem Relation Age of Onset  . Hyperlipidemia Mother   . Heart disease Mother   . Arthritis Mother   . Stroke Mother   . Heart disease Father   . Diabetes Father        borderline  . Crohn's disease Sister   . Diabetes Paternal Grandfather   . Cancer Neg Hx   . Colon cancer Neg Hx     Social History   Socioeconomic History  . Marital status: Divorced    Spouse name: Not on file  . Number of children: Not on file  . Years of education: Not on file  . Highest education level: Not on file  Occupational History  . Occupation: high school PE Product manager: [employer]  Tobacco Use  . Smoking status: Current Every Day Smoker    Last attempt to quit:  09/22/2013  Years since quitting: 7.3  . Smokeless tobacco: Former Systems developer    Types: [place] date: 2009  Substance and Sexual Activity  . Alcohol use: Yes    Alcohol/week: 10.0 standard drinks    Types: 10 Cans of beer per week  . Drug use: No  . 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 Not on file  Social History Narrative   Exercise - calisthenics, walking in afternoons 3-5 days per week, single, 2 children   Social Determinants of Health   Financial Resource Strain: Not on file  Food Insecurity: Not on file  Transportation Needs: Not on file  Physical Activity: Not on file  Stress: Not on file  Social Connections: Not on file  Intimate Partner Violence: Not on file    Review of Systems: All systems reviewed and negative except where noted in HPI.    OBJECTIVE:    Physical Exam: Vital signs in last 24 hours: Temp:  [97.5 F (36.4 C)-98.4 F (36.9 C)] 98.4 F (36.9 C) (05/09 1241) Pulse Rate:  [62-67] 63 (05/09 1413) Resp:  [16-17] 16 (05/09 1413) BP: (122-155)/(86-105) 136/96 (05/09 1345) SpO2:  [95 %-98 %] 98 % (05/09 1413) Weight:  [106.6 kg-106.7 kg] 106.6 kg (05/09 1302)   General:   Alert male in NAD Psych:  Pleasant, cooperative. Normal mood and affect. Eyes:  Pupils equal, sclera clear, no icterus.   Conjunctiva pink. Ears:  Normal auditory acuity. Nose:  No deformity, discharge,  or lesions. Neck:  Supple; no masses Lungs:  Clear throughout to auscultation.   No wheezes, crackles, or rhonchi.  Heart:  Regular rate and rhythm; loud murmur, no lower extremity edema Abdomen:  Soft, non-distended, nontender, BS active, no palp mass   Rectal:  Deferred  Msk:  Symmetrical without gross deformities. . Neurologic:  Alert and  oriented x4;  grossly normal neurologically. Skin:  Intact without significant lesions or rashes.  Filed Weights    01/17/21 1302  Weight: 106.6 kg     Scheduled inpatient medications     Intake/Output from previous day: No intake/output data recorded. Intake/Output this shift: No intake/output data recorded.   Lab Results: Recent Labs    01/17/21 1312  WBC 13.2*  HGB 16.0  HCT 44.6  PLT 323   BMET Recent Labs    01/17/21 1312  NA 136  K 3.9  CL 104  CO2 23  GLUCOSE 112*  BUN 17  CREATININE 0.79  CALCIUM 9.2   LFT Recent Labs    01/17/21 1312  PROT 7.1  ALBUMIN 4.2  AST 31  ALT 42  ALKPHOS 47  BILITOT 1.2   PT/INR No results for input(s): LABPROT, INR in the last 72 hours. Hepatitis Panel No results for input(s): HEPBSAG, HCVAB, HEPAIGM, HEPBIGM in the last 72 hours.   . CBC Latest Ref Rng & Units 01/17/2021 04/02/2020 06/18/2017  WBC 4.0 - 10.5 K/uL 13.2(H) 9.0 11.1(H)  Hemoglobin 13.0 - 17.0 g/dL 16.0 15.4 15.6  Hematocrit 39.0 - 52.0 % 44.6 43.2 45.2  Platelets 150 - 400 K/uL 323 306 357    . CMP Latest Ref Rng & Units 01/17/2021 04/02/2020 05/21/2019  Glucose 70 - 99 mg/dL 112(H) 112(H) 113(H)  BUN 6 - 20 mg/dL 17 20 21   Creatinine 0.61 - 1.24 mg/dL 0.79 0.83 0.91  Sodium 135 - 145 mmol/L  136 139 139  Potassium 3.5 - 5.1 mmol/L 3.9 4.6 4.5  Chloride 98 - 111 mmol/L 104 102 104  CO2 22 - 32 mmol/L 23 24 21   Calcium 8.9 - 10.3 mg/dL 9.2 9.5 9.6  Total Protein 6.5 - 8.1 g/dL 7.1 6.7 6.9  Total Bilirubin 0.3 - 1.2 mg/dL 1.2 0.7 0.6  Alkaline Phos 38 - 126 U/L 47 57 55  AST 15 - 41 U/L 31 34 31  ALT 0 - 44 U/L 42 46(H) 41   Studies/Results: No results found.  Active Problems:   * No active hospital problems. Tye Savoy, NP-C @  01/17/2021, 2:18 PM

## 2021-01-17 NOTE — ED Triage Notes (Signed)
Pt presents to the ED from PCP. Pt states he was eating dinner last night around 1830 and it feels like there is something stuck down deep in his throat at the chest level. States he is also unable to keep anything down. Patent airway.

## 2021-01-17 NOTE — ED Notes (Signed)
Pt to Endo 

## 2021-01-17 NOTE — Interval H&P Note (Signed)
History and Physical Interval Note:  01/17/2021 4:07 PM  Chris Skinner  has presented today for surgery, with the diagnosis of food impaction.  The various methods of treatment have been discussed with the patient and family. After consideration of risks, benefits and other options for treatment, the patient has consented to  Procedure(s): ESOPHAGOGASTRODUODENOSCOPY (EGD) WITH PROPOFOL (N/A) as a surgical intervention.  The patient's history has been reviewed, patient examined, no change in status, stable for surgery.  I have reviewed the patient's chart and labs.  Questions were answered to the patient's satisfaction.     Pricilla Riffle. [place] Plan

## 2021-01-17 NOTE — Anesthesia Preprocedure Evaluation (Addendum)
Anesthesia Evaluation  Patient identified by MRN, date of birth, ID band Patient awake    Reviewed: Allergy & Precautions, NPO status , Patient's Chart, lab work & pertinent test results  Airway Mallampati: II  TM Distance: >3 FB Neck ROM: Full    Dental  (+) Teeth Intact, Dental Advisory Given, Caps,    Pulmonary sleep apnea and Continuous Positive Airway Pressure Ventilation , Current Smoker and Patient abstained from smoking.,    Pulmonary exam normal breath sounds clear to auscultation       Cardiovascular hypertension, Pt. on medications + Valvular Problems/Murmurs AS  Rhythm:Regular Rate:Normal + Systolic murmurs    Neuro/Psych negative neurological ROS  negative psych ROS   GI/Hepatic Neg liver ROS, food impaction   Endo/Other  Obesity   Renal/GU negative Renal ROS     Musculoskeletal negative musculoskeletal ROS (+)   Abdominal   Peds  Hematology negative hematology ROS (+)   Anesthesia Other Findings Day of surgery medications reviewed with the patient.  Reproductive/Obstetrics                            Anesthesia Physical Anesthesia Plan  ASA: III  Anesthesia Plan: General   Post-op Pain Management:    Induction: Intravenous  PONV Risk Score and Plan: 1 and Dexamethasone and Ondansetron  Airway Management Planned: Oral ETT  Additional Equipment:   Intra-op Plan:   Post-operative Plan: Extubation in OR  Informed Consent: I have reviewed the patients History and Physical, chart, labs and discussed the procedure including the risks, benefits and alternatives for the proposed anesthesia with the patient or authorized representative who has indicated his/her understanding and acceptance.     Dental advisory given  Plan Discussed with: CRNA  Anesthesia Plan Comments:        Anesthesia Quick Evaluation

## 2021-01-17 NOTE — ED Notes (Signed)
Patient has Urinal at bedside, He is aware we need a UA.

## 2021-01-17 NOTE — ED Provider Notes (Addendum)
Emergency Medicine Provider Triage Evaluation Note  Chris Skinner , a 55 y.o. male  was evaluated in triage.  Pt complains of unable to swallow, food wont go down. Onset at dinner yesterday. Regurgitates anything he swallows at this point. Has happened in the past but has not seen GI. Sent by PCP.  Has seen Dr. Carlean Purl for colonoscopy.   Review of Systems  Positive: Difficulty swallowing Negative: CP, abdominal pain, SHOB  Physical Exam  BP (!) 155/105 (BP Location: Left Arm)   Pulse 67   Temp 98.4 F (36.9 C) (Oral)   Resp 17   SpO2 95%  Gen:   Awake, no distress   Resp:  Normal effort  MSK:   Moves extremities without difficulty  Other:    Medical Decision Making  Medically screening exam initiated at 1:01 PM.  Appropriate orders placed.  Chris Skinner was informed that the remainder of the evaluation will be completed by another provider, this initial triage assessment does not replace that evaluation, and the importance of remaining in the ED until their evaluation is complete.     Tacy Learn, PA-C 01/17/21 1302    Tacy Learn, PA-C 01/17/21 1303    Milton Ferguson, MD 01/17/21 1351

## 2021-01-17 NOTE — Discharge Instructions (Signed)
Follow-up with your normal doctor.

## 2021-01-17 NOTE — Consult Note (Addendum)
Referring Provider:  [facility]         Primary Care Physician:  Denita Lung, MD Primary Gastroenterologist: Silvano Rusk, MD             We were asked to see this patient for:     Food impaction             ASSESSMENT / PLAN:   # 55 yo male with intermittent solid food dysphagia over last ~ 4 years. In ED with food impaction which occurred last night while eating ribs.  --Awaiting COVID19 results then will proceed with EGD with extraction of food bolus. The risks and benefits of EGD with possible biopsies was discussed with the patient and they agree to proceed.   # Loud murmur / Aortic stenosis  # History of adenomatous colon polyps. Two small polyps < 10 mm removed in  April 2015. A 7 to 10 year interval colonoscopy would be recommended based on current surveillance guidelines. This can be addressed as outpatient.     Attending Physician Note   I have taken a history, examined the patient and reviewed the chart. I agree with the Advanced Practitioner's note, impression and recommendations.  * Acute food impaction last night while eating ribs. Intermittent, mild solid food dysphagia for several years. History of ulcerative esophagitis.  * Aortic stenosis, moderate and stable, per last echocardiogram in July 2021   * Urgent EGD today to remove food impaction, assess for cause of dysphagia   Chris Edward, MD [facility] [phone]       HPI:                                                                                                                             Chief Complaint:  Food stuck in esophagus  Chris Skinner is a 55 y.o. male with a past medical history of moderate aortic stenosis,  Ulcerative esophagitis (remote), adenomatous colon polyps, HTN, hyperlipidemia and diverticulosis  Patient saw PCP today for swallowing difficulty. He was sent to ED for evaluation. Patient has been having intermittent solid food dysphagia for about 4 years but has never  had an impaction. He thinks rib meat is stuck in esophagus from dinner last night. Cannot swallow water but handing secretions. No chest pain or odynophagia. Patient gives a very remote history of ulcerative esophagitis. He denies any GERD symptoms.  No other GI complaints  In ED a CMP is unremarkable. CBC remarkable for WBC of 13.2  PREVIOUS ENDOSCOPIC EVALUATIONS / PERTINENT STUDIES   April 2015 screening colonoscopy  --complete exam, good prep --ENDOSCOPIC IMPRESSION: 1. Two diminutive sessile polyps were found at the splenic flexure and in the sigmoid colon; polypectomy was performed with cold forceps 2. Mild diverticulosis was noted - otherwise normal colon, good prep - first exam  Surgical [P], splenic flexure, sigmoid, polyp (2) - TUBULAR ADENOMA. - PERINEURIOMA. - HIGH GRADE DYSPLASIA IS NOT IDENTIFIED. - SEE COMMENT.  Past Medical History:  Diagnosis Date  . Chest pain 2001   [facility], normal cardiac workup  . Dyslipidemia   . H/O echocardiogram 1/15   PFO found incidentally.  Echo done due to murmur  . Hyperlipidemia   . Hypertension   . Personal history of colonic polyp - adenoma 01/05/2014  . Tobacco use disorder   . Wears glasses     Past Surgical History:  Procedure Laterality Date  . COLONOSCOPY  2015  . KNEE ARTHROSCOPY Left 1985  . TONSILLECTOMY AND ADENOIDECTOMY  1974    Prior to Admission medications   Medication Sig Start Date End Date Taking? Authorizing Provider  amLODipine (NORVASC) 5 MG tablet Take 1 tablet (5 mg total) by mouth daily. 04/26/20   Denita Lung, MD  Cetirizine HCl (ZYRTEC PO) Take by mouth.    [provider]  ezetimibe (ZETIA) 10 MG tablet Take 1 tablet (10 mg total) by mouth daily. 04/09/20   Troy Sine, MD  lisinopril-hydrochlorothiazide (ZESTORETIC) 20-12.5 MG tablet Take 1 tablet by mouth daily. 04/26/20   Denita Lung, MD  Naproxen Sodium (ALEVE PO) Take by mouth.    [provider]  Omega-3 Fatty  Acids (FISH OIL OMEGA-3 PO) Take by mouth.    [provider]  rosuvastatin (CRESTOR) 40 MG tablet TAKE 1 TABLET (40 MG TOTAL) DAILY BY MOUTH. 10/07/20   Troy Sine, MD  tadalafil (CIALIS) 20 MG tablet Take 1 tablet (20 mg total) by mouth daily as needed for erectile dysfunction. 05/13/20   Denita Lung, MD    No current facility-administered medications for this encounter.   Current Outpatient Medications  Medication Sig Dispense Refill  . amLODipine (NORVASC) 5 MG tablet Take 1 tablet (5 mg total) by mouth daily. 90 tablet 3  . Cetirizine HCl (ZYRTEC PO) Take by mouth.    . ezetimibe (ZETIA) 10 MG tablet Take 1 tablet (10 mg total) by mouth daily. 90 tablet 3  . lisinopril-hydrochlorothiazide (ZESTORETIC) 20-12.5 MG tablet Take 1 tablet by mouth daily. 90 tablet 3  . Naproxen Sodium (ALEVE PO) Take by mouth.    . Omega-3 Fatty Acids (FISH OIL OMEGA-3 PO) Take by mouth.    . rosuvastatin (CRESTOR) 40 MG tablet TAKE 1 TABLET (40 MG TOTAL) DAILY BY MOUTH. 90 tablet 1  . tadalafil (CIALIS) 20 MG tablet Take 1 tablet (20 mg total) by mouth daily as needed for erectile dysfunction. 30 tablet 2    Allergies as of 01/17/2021  . (No Known Allergies)    Family History  Problem Relation Age of Onset  . Hyperlipidemia Mother   . Heart disease Mother   . Arthritis Mother   . Stroke Mother   . Heart disease Father   . Diabetes Father        borderline  . Crohn's disease Sister   . Diabetes Paternal Grandfather   . Cancer Neg Hx   . Colon cancer Neg Hx     Social History   Socioeconomic History  . Marital status: Divorced    Spouse name: Not on file  . Number of children: Not on file  . Years of education: Not on file  . Highest education level: Not on file  Occupational History  . Occupation: high school PE Product manager: [employer]  Tobacco Use  . Smoking status: Current Every Day Smoker    Last attempt to quit:  09/22/2013  Years since quitting: 7.3  . Smokeless tobacco: Former Systems developer    Types: [place] date: 2009  Substance and Sexual Activity  . Alcohol use: Yes    Alcohol/week: 10.0 standard drinks    Types: 10 Cans of beer per week  . Drug use: No  . 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 Not on file  Social History Narrative   Exercise - calisthenics, walking in afternoons 3-5 days per week, single, 2 children   Social Determinants of Health   Financial Resource Strain: Not on file  Food Insecurity: Not on file  Transportation Needs: Not on file  Physical Activity: Not on file  Stress: Not on file  Social Connections: Not on file  Intimate Partner Violence: Not on file    Review of Systems: All systems reviewed and negative except where noted in HPI.    OBJECTIVE:    Physical Exam: Vital signs in last 24 hours: Temp:  [97.5 F (36.4 C)-98.4 F (36.9 C)] 98.4 F (36.9 C) (05/09 1241) Pulse Rate:  [62-67] 63 (05/09 1413) Resp:  [16-17] 16 (05/09 1413) BP: (122-155)/(86-105) 136/96 (05/09 1345) SpO2:  [95 %-98 %] 98 % (05/09 1413) Weight:  [106.6 kg-106.7 kg] 106.6 kg (05/09 1302)   General:   Alert male in NAD Psych:  Pleasant, cooperative. Normal mood and affect. Eyes:  Pupils equal, sclera clear, no icterus.   Conjunctiva pink. Ears:  Normal auditory acuity. Nose:  No deformity, discharge,  or lesions. Neck:  Supple; no masses Lungs:  Clear throughout to auscultation.   No wheezes, crackles, or rhonchi.  Heart:  Regular rate and rhythm; loud murmur, no lower extremity edema Abdomen:  Soft, non-distended, nontender, BS active, no palp mass   Rectal:  Deferred  Msk:  Symmetrical without gross deformities. . Neurologic:  Alert and  oriented x4;  grossly normal neurologically. Skin:  Intact without significant lesions or rashes.  Filed Weights    01/17/21 1302  Weight: 106.6 kg     Scheduled inpatient medications     Intake/Output from previous day: No intake/output data recorded. Intake/Output this shift: No intake/output data recorded.   Lab Results: Recent Labs    01/17/21 1312  WBC 13.2*  HGB 16.0  HCT 44.6  PLT 323   BMET Recent Labs    01/17/21 1312  NA 136  K 3.9  CL 104  CO2 23  GLUCOSE 112*  BUN 17  CREATININE 0.79  CALCIUM 9.2   LFT Recent Labs    01/17/21 1312  PROT 7.1  ALBUMIN 4.2  AST 31  ALT 42  ALKPHOS 47  BILITOT 1.2   PT/INR No results for input(s): LABPROT, INR in the last 72 hours. Hepatitis Panel No results for input(s): HEPBSAG, HCVAB, HEPAIGM, HEPBIGM in the last 72 hours.   . CBC Latest Ref Rng & Units 01/17/2021 04/02/2020 06/18/2017  WBC 4.0 - 10.5 K/uL 13.2(H) 9.0 11.1(H)  Hemoglobin 13.0 - 17.0 g/dL 16.0 15.4 15.6  Hematocrit 39.0 - 52.0 % 44.6 43.2 45.2  Platelets 150 - 400 K/uL 323 306 357    . CMP Latest Ref Rng & Units 01/17/2021 04/02/2020 05/21/2019  Glucose 70 - 99 mg/dL 112(H) 112(H) 113(H)  BUN 6 - 20 mg/dL 17 20 21   Creatinine 0.61 - 1.24 mg/dL 0.79 0.83 0.91  Sodium 135 - 145 mmol/L  136 139 139  Potassium 3.5 - 5.1 mmol/L 3.9 4.6 4.5  Chloride 98 - 111 mmol/L 104 102 104  CO2 22 - 32 mmol/L 23 24 21   Calcium 8.9 - 10.3 mg/dL 9.2 9.5 9.6  Total Protein 6.5 - 8.1 g/dL 7.1 6.7 6.9  Total Bilirubin 0.3 - 1.2 mg/dL 1.2 0.7 0.6  Alkaline Phos 38 - 126 U/L 47 57 55  AST 15 - 41 U/L 31 34 31  ALT 0 - 44 U/L 42 46(H) 41   Studies/Results: No results found.  Active Problems:   * No active hospital problems. Tye Savoy, NP-C @  01/17/2021, 2:18 PM

## 2021-01-17 NOTE — ED Provider Notes (Signed)
[facility] EMERGENCY DEPARTMENT Provider Note   CSN: [id number] Arrival date & time: 01/17/21  1236     History Chief Complaint  Patient presents with  . Abdominal Pain    Chris Skinner is a 55 y.o. male.  HPI  Presents with food bolus sensation.  Was eating ribs last night when he suddenly got the sensation that something was stuck in his throat.  He has had this before and sometimes will have to induce vomiting to clear it.  He has not been able to tolerate any food or water since this happened.  He had an upper endoscopy several years ago that showed ulcers but has not had a follow-up since.  No choking or gagging or sensation that he is having difficulty breathing.  He is tolerating his secretions     Past Medical History:  Diagnosis Date  . Chest pain 2001   [facility], normal cardiac workup  . Dyslipidemia   . H/O echocardiogram 1/15   PFO found incidentally.  Echo done due to murmur  . Hyperlipidemia   . Hypertension   . Personal history of colonic polyp - adenoma 01/05/2014  . Tobacco use disorder   . Wears glasses     Patient Active Problem List   Diagnosis Date Noted  . Smoker 04/26/2020  . Erectile dysfunction 04/26/2020  . Obesity (BMI 30.0-34.9) 04/26/2020  . Glucose intolerance 04/26/2020  . Family history of prostate cancer in father 04/26/2020  . Family history of diabetes mellitus 04/26/2020  . Moderate aortic stenosis 04/23/2019  . Sleep apnea 04/23/2019  . History of colonic polyps 01/05/2014  . Dyslipidemia, goal LDL below 100 04/16/2012  . Uncontrolled hypertension 04/16/2012    Past Surgical History:  Procedure Laterality Date  . COLONOSCOPY  2015  . KNEE ARTHROSCOPY Left 1985  . TONSILLECTOMY AND ADENOIDECTOMY  1974       Family History  Problem Relation Age of Onset  . Hyperlipidemia Mother   . Heart disease Mother   . Arthritis Mother   . Stroke Mother   . Heart disease Father   . Diabetes Father         borderline  . Crohn's disease Sister   . Diabetes Paternal Grandfather   . Cancer Neg Hx   . Colon cancer Neg Hx     Social History   Tobacco Use  . Smoking status: Current Every Day Smoker    Last attempt to quit: 09/22/2013    Years since quitting: 7.3  . Smokeless tobacco: Former Systems developer    Types: Chew    Quit date: 2009  Substance Use Topics  . Alcohol use: Yes    Alcohol/week: 10.0 standard drinks    Types: 10 Cans of beer per week  . Drug use: No    Home Medications Prior to Admission medications   Medication Sig Start Date End Date Taking? Authorizing Provider  amLODipine (NORVASC) 5 MG tablet Take 1 tablet (5 mg total) by mouth daily. 04/26/20   Denita Lung, MD  Cetirizine HCl (ZYRTEC PO) Take by mouth.    [provider]  ezetimibe (ZETIA) 10 MG tablet Take 1 tablet (10 mg total) by mouth daily. 04/09/20   Troy Sine, MD  lisinopril-hydrochlorothiazide (ZESTORETIC) 20-12.5 MG tablet Take 1 tablet by mouth daily. 04/26/20   Denita Lung, MD  Naproxen Sodium (ALEVE PO) Take by mouth.    [provider]  Omega-3 Fatty Acids (FISH OIL OMEGA-3 PO) Take by  mouth.    [provider]  rosuvastatin (CRESTOR) 40 MG tablet TAKE 1 TABLET (40 MG TOTAL) DAILY BY MOUTH. 10/07/20   Troy Sine, MD  tadalafil (CIALIS) 20 MG tablet Take 1 tablet (20 mg total) by mouth daily as needed for erectile dysfunction. 05/13/20   Denita Lung, MD    Allergies    Patient has no known allergies.  Review of Systems   Review of Systems  Constitutional: Negative for chills and fever.  Respiratory: Negative for cough and shortness of breath.   Cardiovascular: Negative for chest pain and palpitations.  Gastrointestinal: Negative for abdominal pain and vomiting.  Musculoskeletal: Negative for neck pain and neck stiffness.  Skin: Negative for color change and rash.  All other systems reviewed and are negative.   Physical Exam Updated Vital Signs BP (!)  147/95   Pulse 62   Temp 98 F (36.7 C) (Oral)   Resp 15   Ht 6\' 1"  (1.854 m)   Wt 106.6 kg   SpO2 95%   BMI 31.00 kg/m   Physical Exam Vitals and nursing note reviewed.  Constitutional:      Appearance: He is well-developed.  HENT:     Head: Normocephalic and atraumatic.  Eyes:     Conjunctiva/sclera: Conjunctivae normal.  Cardiovascular:     Rate and Rhythm: Normal rate and regular rhythm.     Heart sounds: No murmur heard.   Pulmonary:     Effort: Pulmonary effort is normal. No respiratory distress.     Breath sounds: Normal breath sounds. No wheezing.  Abdominal:     Palpations: Abdomen is soft.     Tenderness: There is no abdominal tenderness.  Musculoskeletal:     Cervical back: Neck supple.  Skin:    General: Skin is warm and dry.  Neurological:     General: No focal deficit present.     Mental Status: He is alert and oriented to person, place, and time.  Psychiatric:        Behavior: Behavior normal.     Comments: Normal affect     ED Results / Procedures / Treatments   Labs (all labs ordered are listed, but only abnormal results are displayed) Labs Reviewed  CBC WITH DIFFERENTIAL/PLATELET - Abnormal; Notable for the following components:      Result Value   WBC 13.2 (*)    RDW 11.2 (*)    Neutro Abs 8.7 (*)    All other components within normal limits  COMPREHENSIVE METABOLIC PANEL - Abnormal; Notable for the following components:   Glucose, Bld 112 (*)    All other components within normal limits  RESP PANEL BY RT-PCR (FLU A&B, COVID) ARPGX2  LIPASE, BLOOD  URINALYSIS, ROUTINE W REFLEX MICROSCOPIC    EKG None  Radiology No results found.  Procedures Procedures   Medications Ordered in ED Medications - No data to display  ED Course  I have reviewed the triage vital signs and the nursing notes.  Pertinent labs & imaging results that were available during my care of the patient were reviewed by me and considered in my medical decision  making (see chart for details).    MDM Rules/Calculators/A&P                          Patient presents with food bolus.  Not tolerating food or water.  No airway compromise and aspiration into the lungs unlikely.  Abdominal pain.  Basic  labs were ordered and reviewed and did not note any focal abnormalities.  GI was consulted and took the patient for endoscopy.   Final Clinical Impression(s) / ED Diagnoses Final diagnoses:  Food bolus obstruction of intestine [facility])    Rx / DC Orders ED Discharge Orders    None       Aris Lot, MD 01/17/21 1639    Fredia Sorrow, MD 01/26/21 [id number]

## 2021-01-17 NOTE — Op Note (Addendum)
[facility] Patient Name: Chris Skinner Procedure Date : 01/17/2021 MRN: [id number] Attending MD: Ladene Artist , MD Date of Birth: [birth year] CSN: [id number] Age: 55 Admit Type: Emergency Department Procedure:                Upper GI endoscopy Indications:              Dysphagia, Foreign body in the esophagus Providers:                Pricilla Riffle. Fuller Plan, MD, Erenest Rasher, RN, Lesia Sago, Technician Referring MD:              Medicines:                General Anesthesia Complications:            No immediate complications. Estimated Blood Loss:     Estimated blood loss was minimal. Procedure:                Pre-Anesthesia Assessment:                           - Prior to the procedure, a History and Physical                            was performed, and patient medications and                            allergies were reviewed. The patient's tolerance of                            previous anesthesia was also reviewed. The risks                            and benefits of the procedure and the sedation                            options and risks were discussed with the patient.                            All questions were answered, and informed consent                            was obtained. Prior Anticoagulants: The patient has                            taken no previous anticoagulant or antiplatelet                            agents. ASA Grade Assessment: II - A patient with                            mild systemic disease. After reviewing the risks  and benefits, the patient was deemed in                            satisfactory condition to undergo the procedure.                           After obtaining informed consent, the endoscope was                            passed under direct vision. Throughout the                            procedure, the patient's blood pressure, pulse, and                             oxygen saturations were monitored continuously. The                            [id number]-[id number] ([id number]) Olympus gastroscope was                            introduced through the mouth, and advanced to the                            second part of duodenum. The upper GI endoscopy was                            accomplished without difficulty. The patient                            tolerated the procedure well. Scope In: Scope Out: Findings:      Food (meat) was found in the mid and distal esophagus. Removal of food       was accomplished with a 3 pronged grasper. Most was removed orally and       then I was able to visualize the esophageal lumen into the stomach. The       last piece was gently pushed into the stomach.      Mucosal changes including longitudinal furrows and white plaques were       found in the mid esophagus and in the distal esophagus. Biopsies were       taken with a cold forceps for histology.      Localized moderate inflammation characterized by congestion (edema) and       granularity was found in the distal esophagus likely due to pressure       from the food impaction.      The exam of the esophagus was otherwise normal.      The entire examined stomach was normal.      The duodenal bulb and second portion of the duodenum were normal. Impression:               - Food (meat) in the mid and distal esophagus.                            Removal was successful.                           -  Esophageal mucosal changes suspicious for                            eosinophilic esophagitis. Biopsied.                           - Esophageal mucosal changes were present,                            including congestion (edema) and granularity.                            Findings are suggestive of inflammation.                           - Normal stomach.                           - Normal duodenal bulb and second portion of the                            duodenum. Recommendation:            - Clear liquid diet today, then advance as                            tolerated to soft diet tomorrow and remain on a                            soft diet until seen in our office.                           - Continue present medications.                           - Await pathology results.                           - Return to GI office with Dr. Carlean Purl at the next                            available appointment.                           - Patient has a contact number available for                            emergencies. The signs and symptoms of potential                            delayed complications were discussed with the                            patient. Return to normal activities tomorrow.  Written discharge instructions were provided to the                            patient. Procedure Code(s):        --- Professional ---                           [id number], Esophagogastroduodenoscopy, flexible,                            transoral; with removal of foreign body(s)                           43239, Esophagogastroduodenoscopy, flexible,                            transoral; with biopsy, single or multiple Diagnosis Code(s):        --- Professional ---                           T62.563S, Food in esophagus causing other injury,                            initial encounter                           K22.8, Other specified diseases of esophagus                           R13.10, Dysphagia, unspecified                           T18.108A, Unspecified foreign body in esophagus                            causing other injury, initial encounter CPT copyright 2019 American Medical Association. All rights reserved. The codes documented in this report are preliminary and upon coder review may  be revised to meet current compliance requirements. Ladene Artist, MD 01/17/2021 4:55:19 PM This report has been signed electronically. Number of Addenda: 0

## 2021-01-17 NOTE — ED Notes (Signed)
DC Iinstructions reviewed with the pt.  PT verbalized understanding.  PT DC.

## 2021-01-17 NOTE — Progress Notes (Signed)
   Subjective:    Patient ID: Chris Skinner, male    DOB: [birth year], 55 y.o.   MRN: [id number]  HPI He is here for evaluation of difficulty swallowing.  He has had a several year history of very remote issues of swallowing something and having it get stuck and having to vomit back up.  He states that this occurred last night when he was eating some ribs and since then has been unable to keep anything down.  He is tried this morning sips of liquids but then had to vomit back up.  He points to the mid chest area. He also states that he is in need of referral for colonoscopy.  Does have a previous history of colonic polyps   Review of Systems     Objective:   Physical Exam Alert and in no distress.  Cardiac exam shows a systolic murmur.  Abdominal exam shows no masses or tenderness.       Assessment & Plan:  Pharyngoesophageal dysphagia  Moderate aortic stenosis  History of colonic polyps - Plan: Ambulatory referral to Gastroenterology Patient was referred to emergency room for further evaluation and possible extraction of food impaction.

## 2021-01-19 ENCOUNTER — Encounter (HOSPITAL_COMMUNITY): Payer: Self-pay | Admitting: Gastroenterology

## 2021-01-19 LAB — SURGICAL PATHOLOGY

## 2021-01-19 NOTE — Anesthesia Postprocedure Evaluation (Signed)
Anesthesia Post Note  Patient: Chris Skinner  Procedure(s) Performed: ESOPHAGOGASTRODUODENOSCOPY (EGD) WITH PROPOFOL (N/A ) BIOPSY     Patient location during evaluation: Endoscopy Anesthesia Type: General Level of consciousness: awake Pain management: pain level controlled Vital Signs Assessment: post-procedure vital signs reviewed and stable Respiratory status: spontaneous breathing, nonlabored ventilation, respiratory function stable and patient connected to nasal cannula oxygen Cardiovascular status: blood pressure returned to baseline and stable Postop Assessment: no apparent nausea or vomiting Anesthetic complications: no   No complications documented.  Last Vitals:  Vitals:   01/17/21 1720 01/17/21 1753  BP:  (!) 138/98  Pulse: 72 68  Resp: (!) 21 16  Temp:  37.1 C  SpO2: 94% 96%    Last Pain:  Vitals:   01/17/21 1850  TempSrc:   PainSc: 0-No pain                 Jeanne Terrance P Jaedin Regina

## 2021-01-21 ENCOUNTER — Encounter: Payer: BC Managed Care – PPO | Admitting: Family Medicine

## 2021-02-03 ENCOUNTER — Other Ambulatory Visit: Payer: Self-pay

## 2021-02-03 ENCOUNTER — Encounter: Payer: Self-pay | Admitting: Cardiovascular Disease

## 2021-02-03 ENCOUNTER — Ambulatory Visit (INDEPENDENT_AMBULATORY_CARE_PROVIDER_SITE_OTHER): Payer: BC Managed Care – PPO | Admitting: Cardiovascular Disease

## 2021-02-03 VITALS — BP 128/82 | HR 63 | Ht 73.0 in | Wt 238.8 lb

## 2021-02-03 DIAGNOSIS — I350 Nonrheumatic aortic (valve) stenosis: Secondary | ICD-10-CM | POA: Diagnosis not present

## 2021-02-03 DIAGNOSIS — Z79899 Other long term (current) drug therapy: Secondary | ICD-10-CM

## 2021-02-03 DIAGNOSIS — Z720 Tobacco use: Secondary | ICD-10-CM

## 2021-02-03 DIAGNOSIS — I10 Essential (primary) hypertension: Secondary | ICD-10-CM | POA: Diagnosis not present

## 2021-02-03 DIAGNOSIS — E785 Hyperlipidemia, unspecified: Secondary | ICD-10-CM

## 2021-02-03 DIAGNOSIS — G4733 Obstructive sleep apnea (adult) (pediatric): Secondary | ICD-10-CM | POA: Diagnosis not present

## 2021-02-03 NOTE — Patient Instructions (Signed)
Medication Instructions:  Your Physician recommend you continue on your current medication as directed.     *If you need a refill on your cardiac medications before your next appointment, please call your pharmacy*   Lab Work: Your physician recommends that you return for fasting lab work (Lipid, CBC, CMP, TSH, LPa).  If you have labs (blood work) drawn today and your tests are completely normal, you will receive your results only by: Marland Kitchen MyChart Message (if you have MyChart) OR . A paper copy in the mail If you have any lab test that is abnormal or we need to change your treatment, we will call you to review the results.   Testing/Procedures: Your physician has requested that you have an echocardiogram. Echocardiography is a painless test that uses sound waves to create images of your heart. It provides your doctor with information about the size and shape of your heart and how well your heart's chambers and valves are working. This procedure takes approximately one hour. There are no restrictions for this procedure. [place] [address]    Follow-Up: At [facility], you and your health needs are our priority.  As part of our continuing mission to provide you with exceptional heart care, we have created designated Provider Care Teams.  These Care Teams include your primary Cardiologist (physician) and Advanced Practice Providers (APPs -  Physician Assistants and Nurse Practitioners) who all work together to provide you with the care you need, when you need it.  We recommend signing up for the patient portal called "MyChart".  Sign up information is provided on this After Visit Summary.  MyChart is used to connect with patients for Virtual Visits (Telemedicine).  Patients are able to view lab/test results, encounter notes, upcoming appointments, etc.  Non-urgent messages can be sent to your provider as well.   To learn more about what you can do with MyChart, go to  [email or web address].    Your next appointment:   2 month(s)  The format for your next appointment:   In Person  Provider:   Shelva Majestic, MD

## 2021-02-03 NOTE — Progress Notes (Signed)
Cardiology Office Note    Date:  02/10/2021   ID:  KERVENS ROPER, DOB 1966-03-04, MRN [id number]  PCP:  Denita Lung, MD  Cardiologist:  Shelva Majestic, MD   No chief complaint on file.  F/U cardiology consultation, initially referred through the courtesy of Dr. [name]  History of Present Illness:  Chris Skinner is a 55 y.o. male who was referred for evaluation of aortic stenosis through the courtesy of Dr. [name].  I last saw him in July 2021.  He presents for follow-up evaluation  Mr. Shaver has a history of hypertension, hyperlipidemia, and obstructive sleep apnea.  He has a history of tobacco use and currently smokes less than a pack per day but has been smoking for over 35 years.  He had quit smoking for year and a half, but then unfortunately resumed.  He is a Psychiatrist.  He was recently evaluated by Dr. Redmond School and was referred for follow-up echo Doppler study due to.  It is cardiac murmur.  Of note, in January 2015.  He had undergone an echo Doppler study which showed normal systolic function with an EF of 60-65%.  There was a suggestion of a small patent foramen ovale.  He had a mean aortic gradient of 10 and a peak gradient of 18 mm, suggesting very mild aortic stenosis.  At that time.  He underwent a repeat echo Doppler study on 06/21/2017, which continue to show normal LV function with an EF of 55-60%.  However, the aortic valve.  Functionally bicuspid and was calcified with mobility restriction.  He was felt to have mild to moderate aortic stenosis with a mean gradient of 22 and his peak gradient had increased to 46 mm.  There was no mention of a PFO.  There was mild TR.  He has a history of obstructive sleep apnea and in July 2017 he had undergone a sleep study which I personally reviewed.  This revealed moderate sleep apnea with an AHI of 27.9 per hour but his RDI was 42.  He had severe sleep apnea, doing rems sleep with an AHI of 56.7 per hour.   Oxygen nadir was 87%.  He has been on CPAP therapy and is followed by Dr. Annamaria Boots.  At times, he admits to being tired.  He has not had a recent download.  His DME company is [employer].   He has a history of hyperlipidemia and has been on rosuvastatin 20 mg.  2 years ago, total cholesterol was 226.  Lab work in October 2018: total cholesterol 190, triglycerides 161, LDL cholesterol was 120.  Non-HDL cholesterol was 148.    When I initially saw him, his blood pressure was elevated and on repeat by me was 160/100.  Discussed with him the new hypertensive guidelines.  I recommended further titration of lisinopril from 10 mg up to 15 mg for one week and then up to 20 mg daily.  I also recommended more aggressive lipid therapy with his elevated LDL.  I discussed the importance of smoking cessation and the need for follow-up of his aortic stenosis.  I saw him in February 2019 at which time he continued to be asymptomatic.  He was exercising regularly and potentially walking up to 5 to 7 miles per day.  Unfortunately he was still smoking cigarettes.  He had quit for short duration in his early 64s but resumed again.  He denied any chest pain, presyncope or syncope or  palpitations.    Over the last several years, he has remained stable.  He was evaluated by [name] in August 2020 for preoperative clearance prior to undergoing wisdom teeth removal and was stable.  He has had issues with hypertension and hydrochlorothiazide had been added to his lisinopril and amlodipine.  I last saw him in July 2021 at which time he denied any chest pain, PND, orthopnea, or palpitations.  His last echo Doppler study was in September 2020 which showed normal systolic function with grade 2 diastolic dysfunction.  That time his mean valve gradient had increased to 31 mm.  Aortic valve area was 1.14 cm consistent with moderate stenosis.  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  Prior to that he was football season  I recommended a follow-up echo which was done on April 01, 2020.  LV function remains normal with EF at 60 to 65%.  There was mild LVH concentrically.  He had normal diastolic parameters.  He continued to have moderate aortic stenosis with a mean gradient of 31 and peak gradient of 56, not significantly changed from his prior study.  He continues to be asymptomatic.  Since I last saw him, he continues to be asymptomatic and denies any chest pain, palpitations, presyncope, or exertional dyspnea.  Unfortunately he is still smoking approximately 3 to 4 cigarettes/day.  He walks regularly without symptoms.  School will be getting out.  Summer football will shortly begin on Tuesday Wednesdays and Thursdays several weeks after school is completed.  He continues to use CPAP therapy with excellent compliance.   He presents for evaluation.   Past Medical History:  Diagnosis Date  . Chest pain 2001   [place], normal cardiac workup  . Dyslipidemia   . H/O echocardiogram 1/15   PFO found incidentally.  Echo done due to murmur  . Hyperlipidemia   . Hypertension   . Personal history of colonic polyp - adenoma 01/05/2014  . Tobacco use disorder   . Wears glasses     Past Surgical History:  Procedure Laterality Date  . BIOPSY  01/17/2021   Procedure: BIOPSY;  Surgeon: Ladene Artist, MD;  Location: [facility] ENDOSCOPY;  Service: Endoscopy;;  . COLONOSCOPY  2015  . ESOPHAGOGASTRODUODENOSCOPY (EGD) WITH PROPOFOL N/A 01/17/2021   Procedure: ESOPHAGOGASTRODUODENOSCOPY (EGD) WITH PROPOFOL;  Surgeon: Ladene Artist, MD;  Location: [facility] ENDOSCOPY;  Service: Endoscopy;  Laterality: N/A;  . FOREIGN BODY REMOVAL  01/17/2021   Procedure: FOREIGN BODY REMOVAL;  Surgeon: Ladene Artist, MD;  Location[facility] ENDOSCOPY;  Service: Endoscopy;;  . KNEE ARTHROSCOPY Left 1985  . TONSILLECTOMY AND ADENOIDECTOMY  1974    Current Medications: Outpatient Medications Prior to Visit  Medication Sig Dispense Refill  . amLODipine (NORVASC)  5 MG tablet Take 1 tablet (5 mg total) by mouth daily. 90 tablet 3  . Cetirizine HCl (ZYRTEC PO) Take by mouth daily.    Marland Kitchen ezetimibe (ZETIA) 10 MG tablet Take 1 tablet (10 mg total) by mouth daily. 90 tablet 3  . lisinopril-hydrochlorothiazide (ZESTORETIC) 20-12.5 MG tablet Take 1 tablet by mouth daily. 90 tablet 3  . Naproxen Sodium (ALEVE PO) Take by mouth daily.    . Omega-3 Fatty Acids (FISH OIL OMEGA-3 PO) Take by mouth daily.    . rosuvastatin (CRESTOR) 40 MG tablet TAKE 1 TABLET (40 MG TOTAL) DAILY BY MOUTH. 90 tablet 1  . tadalafil (CIALIS) 20 MG tablet Take 1 tablet (20 mg total) by mouth daily as  needed for erectile dysfunction. 30 tablet 2   No facility-administered medications prior to visit.     Allergies:   Patient has no known allergies.   Social History   Socioeconomic History  . Marital status: Divorced    Spouse name: Not on file  . Number of children: Not on file  . Years of education: Not on file  . Highest education level: Not on file  Occupational History  . 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 Smoking status: Current Every Day Smoker    Last attempt to quit: 09/22/2013    Years since quitting: 7.3  . Smokeless tobacco: Former Systems developer    Types: [place] date: 2009  Substance and Sexual Activity  . Alcohol use: Yes    Alcohol/week: 10.0 standard drinks    Types: 10 Cans of beer per week  . Drug use: No  . 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 Not on file  Social History Narrative   Exercise - calisthenics, walking in afternoons 3-5 days per week, single, 2 children   Social Determinants of Health   Financial Resource Strain: Not on file  Food Insecurity: Not on file  Transportation Needs: Not on file  Physical Activity: Not on file  Stress: Not on file   Social Connections: Not on file     Family History:  The patient's  family history includes Arthritis in his mother; Crohn's disease in his sister; Diabetes in his father and paternal grandfather; Heart disease in his father and mother; Hyperlipidemia in his mother; Stroke in his mother.   ROS General: Negative; No fevers, chills, or night sweats;  HEENT: Negative; No changes in vision or hearing, sinus congestion, difficulty swallowing Pulmonary: Negative; No cough, wheezing, shortness of breath, hemoptysis Cardiovascular: Negative; No chest pain, presyncope, syncope, palpitations GI: Negative; No nausea, vomiting, diarrhea, or abdominal pain GU: Negative; No dysuria, hematuria, or difficulty voiding Musculoskeletal: Negative; no myalgias, joint pain, or weakness Hematologic/Oncology: Negative; no easy bruising, bleeding Endocrine: Negative; no heat/cold intolerance; no diabetes Neuro: Negative; no changes in balance, headaches Skin: Negative; No rashes or skin lesions Psychiatric: Negative; No behavioral problems, depression Sleep: Negative; No snoring, daytime sleepiness, hypersomnolence, bruxism, restless legs, hypnogognic hallucinations, no cataplexy Other comprehensive 14 point system review is negative.   PHYSICAL EXAM:   VS:  BP 128/82   Pulse 63   Ht _0  (1.854 m)   Wt 238 lb 12.8 oz (108.3 kg)   SpO2 99%   BMI 31.51 kg/m     Repeat blood pressure by me was 114/78  Wt Readings from Last 3 Encounters:  02/03/21 238 lb 12.8 oz (108.3 kg)  01/17/21 235 lb (106.6 kg)  01/17/21 235 lb 3.2 oz (106.7 kg)    General: Alert, oriented, no distress.  Skin: normal turgor, no rashes, warm and dry HEENT: Normocephalic, atraumatic. Pupils equal round and reactive to light; sclera anicteric; extraocular muscles intact;  Nose without nasal septal hypertrophy Mouth/Parynx benign; Mallinpatti scale 3 Neck: No JVD, no carotid bruits; normal carotid upstroke Lungs: clear to  ausculatation and percussion; no wheezing or rales Chest wall: without tenderness to palpitation Heart: PMI not displaced, RRR, s1 s2 normal, 2/6 mid peaking systolic murmur consistent with his aortic stenosis, no  diastolic murmur, no rubs, gallops, thrills, or heaves Abdomen: soft, nontender; no hepatosplenomehaly, BS+; abdominal aorta nontender and not dilated by palpation. Back: no CVA tenderness Pulses 2+ Musculoskeletal: full range of motion, normal strength, no joint deformities Extremities: no clubbing cyanosis or edema, Homan's sign negative  Neurologic: grossly nonfocal; Cranial nerves grossly wnl Psychologic: Normal mood and affect   EKG:  EKG is ordered today.  ECG (independently read by me): Sinus rhythm with occasional PVCs at 63 bpm.  QTc interval 446 ms  July 2021 ECG (independently read by me): NSR at 64; no ectopy; early trasition  February 2019 ECG (independently read by me): Normal sinus rhythm at 61 bpm.  No ectopy.  Normal intervals.  November 2018 ECG (independently read by me): Normal sinus rhythm at 60 bpm.  PR interval 200 ms.  No ST segment changes.  QTc interval 430 ms   Recent Labs: BMP Latest Ref Rng & Units 02/09/2021 01/17/2021 04/02/2020  Glucose 65 - 99 mg/dL 119(H) 112(H) 112(H)  BUN 6 - 24 mg/dL _0 Creatinine 0.76 - 1.27 mg/dL 0.92 0.79 0.83  BUN/Creat Ratio 9 - 20 20 - 24(H)  Sodium 134 - 144 mmol/L 141 136 139  Potassium 3.5 - 5.2 mmol/L 4.7 3.9 4.6  Chloride 96 - 106 mmol/L 104 104 102  CO2 20 - 29 mmol/L _1 Calcium 8.7 - 10.2 mg/dL 9.4 9.2 9.5     Hepatic Function Latest Ref Rng & Units 02/09/2021 01/17/2021 04/02/2020  Total Protein 6.0 - 8.5 g/dL 6.8 7.1 6.7  Albumin 3.8 - 4.9 g/dL 4.7 4.2 4.3  AST 0 - 40 IU/L 27 31 34  ALT 0 - 44 IU/L 37 42 46(H)  Alk Phosphatase 44 - 121 IU/L 55 47 57  Total Bilirubin 0.0 - 1.2 mg/dL 0.7 1.2 0.7    CBC Latest Ref Rng & Units 02/09/2021 01/17/2021 04/02/2020  WBC 3.4 - 10.8 x10E3/uL 8.6 13.2(H) 9.0   Hemoglobin 13.0 - 17.7 g/dL 15.2 16.0 15.4  Hematocrit 37.5 - 51.0 % 44.3 44.6 43.2  Platelets 150 - 450 x10E3/uL 300 323 306   Lab Results  Component Value Date   MCV 94 02/09/2021   MCV 90.3 01/17/2021   MCV 89 04/02/2020   Lab Results  Component Value Date   TSH 0.741 02/09/2021   Lab Results  Component Value Date   HGBA1C 5.9 (A) 04/26/2020     BNP No results found for: BNP  ProBNP No results found for: PROBNP   Lipid Panel     Component Value Date/Time   CHOL 167 02/09/2021 0822   TRIG 110 02/09/2021 0822   HDL 45 02/09/2021 0822   CHOLHDL 3.7 02/09/2021 0822   CHOLHDL 4.5 06/18/2017 1312   VLDL 34 11/04/2014 0958   LDLCALC 102 (H) 02/09/2021 0822   LDLCALC 120 (H) 06/18/2017 1312     RADIOLOGY: No results found.   Additional studies/ records that were reviewed today include:  I reviewed the patient's prior echo Doppler study from 09/29/2013 and is most recent study of 06/21/2017.  I also personally reviewed his diagnostic polysomnogram from 03/27/2016 and recent laboratory and records from Dr. Redmond School.  ECHO; 05/21/2019 IMPRESSIONS  1. The average left ventricular global longitudinal strain is -21.4 %.  2. The left ventricle has normal systolic function, with an ejection  fraction of 55-60%. The cavity size was normal. Left ventricular diastolic  Doppler parameters are consistent with pseudonormalization. No evidence of  left ventricular  regional wall  motion abnormalities.  3. The right ventricle has normal systolic function. The cavity was  normal. There is no increase in right ventricular wall thickness. Right  ventricular systolic pressure could not be assessed.  4. Left atrial size was mildly dilated.  5. There is mild to moderate mitral annular calcification present.  6. The aortic valve is tricuspid. Moderate thickening of the aortic  valve. Severe calcifcation of the aortic valve. Moderate stenosis of the  aortic valve. Mean AVG  35mHg. AVA 1.14cm2. Dimensionless index 36.  7. The aorta is normal unless otherwise noted.  8. Compared to prior echo the mean AVG has increased from 25 to 35mg.   ASSESSMENT:    1. Moderate aortic stenosis   2. Medication management   3. Essential hypertension   4. OSA (obstructive sleep apnea)   5. Tobacco abuse   6. Dyslipidemia     PLAN:  Mr. ToJah Alarids a [age]ear-old gentleman who has a history of hypertension and hyperlipidemia.   When I initially saw him he was hypertensive with stage II hypertension despite being on lisinopril 10 mg.  At that time I further titrated lisinopril to 20 mg daily.  He was on rosuvastatin for hyperlipidemia.  His initial echo Doppler study in January 2015 showed mild aortic stenosis with a mean gradient of 10, and peak gradient of 18 mm.  He had normal LV function.  A subsequent echo 3 years later in October 2018 showed an EF of 55 to 60% and there was slight progression of his AS now was mild to moderate with a mean gradient of 22 and peak gradient at 46 mm.  An echo in 2020 showed additional progression of his aortic stenosis with an EF of 55 to 60% and grade 2 diastolic dysfunction.  Mean gradient was now 31 mm and peak gradient was 55.8 mm.  Aortic valve area was 1.1 cm consistent with at least moderate aortic stenosis.  His most recent echo in July 2021 essentially was unchanged with EF 60 to 65%, mild concentric LVH, normal diastolic parameters, and moderate aortic stenosis with a mean gradient of 31 and peak gradient of 50.1.  Aortic valve area was 0.93 cm.  He has continued to be asymptomatic and specifically denies any chest pain, change in exercise tolerance, exertional dyspnea, presyncope or syncope.  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  With his longstanding tobacco history, I also have suggested a screening coronary calcium score to assess for  coronary calcification.  Lipid studies in July 2021 showed a total cholesterol of 203, triglycerides 156, and LDL 128.  He is on rosuvastatin 40 mg and Zetia 10 mg.  His blood pressure today is controlled on amlodipine 5 mg, and lisinopril HCT 20/12.5 mg.  I am recommending he undergo complete set of fasting laboratory I will also check a LP(a) with its association to aortic stenosis.  He continues to use CPAP with excellent compliance. His machine is an ResMed AirSense 10 with set up date August 2017 which is no longer downloadable with transition to 5GTime Warnerhowever on his most recent download through March 2022 compliance was excellent with 100% use and AHI 0.4.  He was having frequent daily leak was questioning about different mask technology.  I have suggested that he may benefit from the ResMed AirFit N 30 I mask.  He will also be a candidate  for the new CPAP  AirAense 11 Auto  unit but unfortunately there is significant back order delay of 3 to 6 months.  I will see him in 2 months for follow-up evaluation.    Medication Adjustments/Labs and Tests Ordered: Current medicines are reviewed at length with the patient today.  Concerns regarding medicines are outlined above.  Medication changes, Labs and Tests ordered today are listed in the Patient Instructions below. Patient Instructions  Medication Instructions:  Your Physician recommend you continue on your current medication as directed.     *If you need a refill on your cardiac medications before your next appointment, please call your pharmacy*   Lab Work: Your physician recommends that you return for fasting lab work (Lipid, CBC, CMP, TSH, LPa).  If you have labs (blood work) drawn today and your tests are completely normal, you will receive your results only by: Marland Kitchen MyChart Message (if you have MyChart) OR . A paper copy in the mail If you have any lab test that is abnormal or we need to change your treatment, we will call you to  review the results.   Testing/Procedures: Your physician has requested that you have an echocardiogram. Echocardiography is a painless test that uses sound waves to create images of your heart. It provides your doctor with information about the size and shape of your heart and how well your heart's chambers and valves are working. This procedure takes approximately one hour. There are no restrictions for this procedure. [place] [address]    Follow-Up: At [facility], you and your health needs are our priority.  As part of our continuing mission to provide you with exceptional heart care, we have created designated Provider Care Teams.  These Care Teams include your primary Cardiologist (physician) and Advanced Practice Providers (APPs -  Physician Assistants and Nurse Practitioners) who all work together to provide you with the care you need, when you need it.  We recommend signing up for the patient portal called "MyChart".  Sign up information is provided on this After Visit Summary.  MyChart is used to connect with patients for Virtual Visits (Telemedicine).  Patients are able to view lab/test results, encounter notes, upcoming appointments, etc.  Non-urgent messages can be sent to your provider as well.   To learn more about what you can do with MyChart, go to [email or web address].    Your next appointment:   2 month(s)  The format for your next appointment:   In Person  Provider:   Shelva Majestic, MD        Signed, Shelva Majestic, MD  02/10/2021 2:21 PM    [facility] [address], [place], [place], [place]  [address] Phone: [phone]

## 2021-02-10 ENCOUNTER — Encounter: Payer: Self-pay | Admitting: Cardiovascular Disease

## 2021-02-10 LAB — LIPID PANEL
Chol/HDL Ratio: 3.7 ratio (ref 0.0–5.0)
Cholesterol, Total: 167 mg/dL (ref 100–199)
HDL: 45 mg/dL (ref >39)
LDL Chol Calc (NIH): 102 mg/dL — ABNORMAL HIGH (ref 0–99)
Triglycerides: 110 mg/dL (ref 0–149)
VLDL Cholesterol Cal: 20 mg/dL (ref 5–40)

## 2021-02-10 LAB — COMPREHENSIVE METABOLIC PANEL
ALT: 37 IU/L (ref 0–44)
AST: 27 IU/L (ref 0–40)
Albumin/Globulin Ratio: 2.2 (ref 1.2–2.2)
Albumin: 4.7 g/dL (ref 3.8–4.9)
Alkaline Phosphatase: 55 IU/L (ref 44–121)
BUN/Creatinine Ratio: 20 (ref 9–20)
BUN: 18 mg/dL (ref 6–24)
Bilirubin Total: 0.7 mg/dL (ref 0.0–1.2)
CO2: 21 mmol/L (ref 20–29)
Calcium: 9.4 mg/dL (ref 8.7–10.2)
Chloride: 104 mmol/L (ref 96–106)
Creatinine, Ser: 0.92 mg/dL (ref 0.76–1.27)
Globulin, Total: 2.1 g/dL (ref 1.5–4.5)
Glucose: 119 mg/dL — ABNORMAL HIGH (ref 65–99)
Potassium: 4.7 mmol/L (ref 3.5–5.2)
Sodium: 141 mmol/L (ref 134–144)
Total Protein: 6.8 g/dL (ref 6.0–8.5)
eGFR: 97 mL/min/{1.73_m2} (ref >59)

## 2021-02-10 LAB — CBC
Hematocrit: 44.3 % (ref 37.5–51.0)
Hemoglobin: 15.2 g/dL (ref 13.0–17.7)
MCH: 32.2 pg (ref 26.6–33.0)
MCHC: 34.3 g/dL (ref 31.5–35.7)
MCV: 94 fL (ref 79–97)
Platelets: 300 10*3/uL (ref 150–450)
RBC: 4.72 x10E6/uL (ref 4.14–5.80)
RDW: 11.7 % (ref 11.6–15.4)
WBC: 8.6 10*3/uL (ref 3.4–10.8)

## 2021-02-10 LAB — LIPOPROTEIN A (LPA): Lipoprotein (a): <8.4 nmol/L (ref <75.0)

## 2021-02-10 LAB — TSH: TSH: 0.741 u[IU]/mL (ref 0.450–4.500)

## 2021-02-19 IMAGING — CT CT MAXILLOFACIAL W/O CM
3 series · 14 of 47 positions shown, 16 images · non-contrast
Comparison: None.

CLINICAL DATA: 54-year-old male status post fall, multiple facial
lacerations.

EXAM:
CT MAXILLOFACIAL WITHOUT CONTRAST
TECHNIQUE: Multidetector CT imaging of the maxillofacial structures was
performed. Multiplanar CT image reconstructions were also generated.

[Series 2: max soft · axial · 0.37mm/px · z∈[-275,-119]mm · 8 of 92 slices shown, 10 images]
[im 7/92  brain]
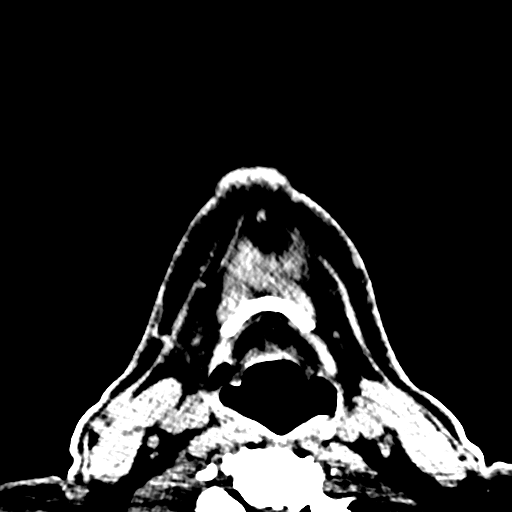
[im 7/92  bone]
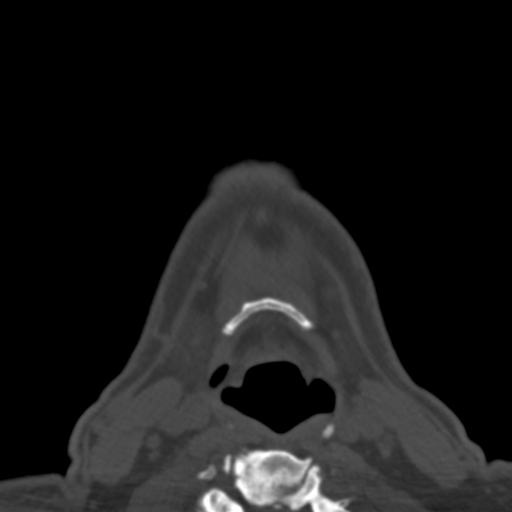
[im 19/92  bone]
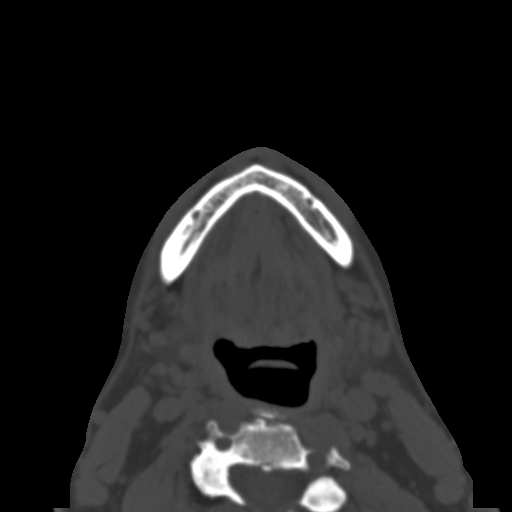
[im 29/92  bone]
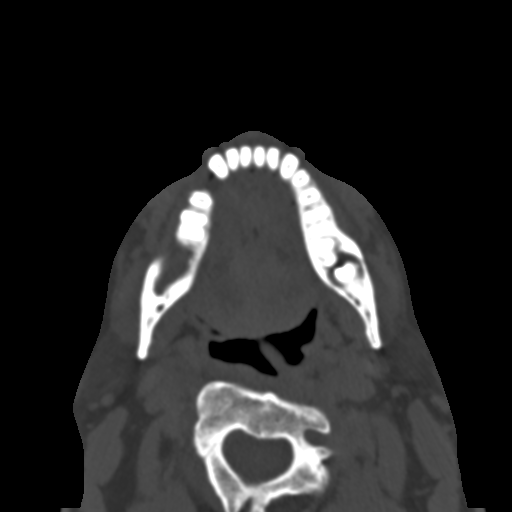
[im 41/92  bone]
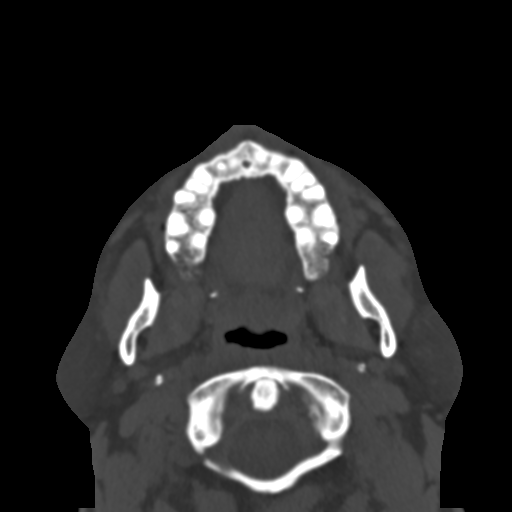
[im 51/92  brain]
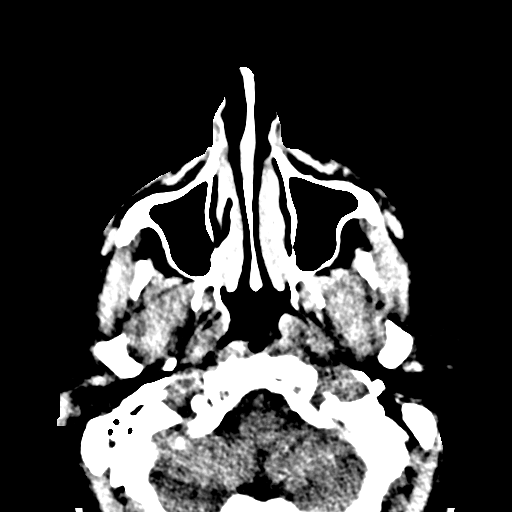
[im 51/92  bone]
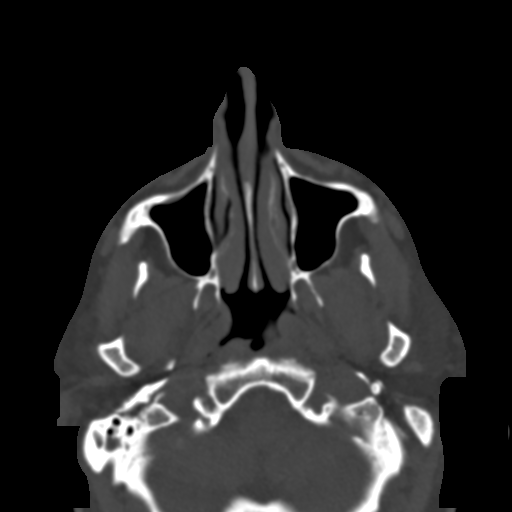
[im 63/92  bone]
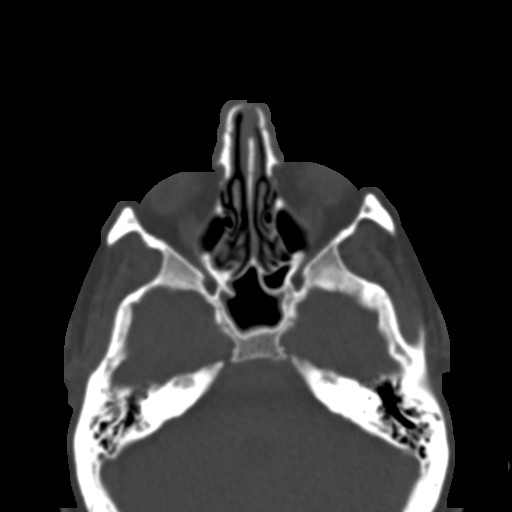
[im 73/92  bone]
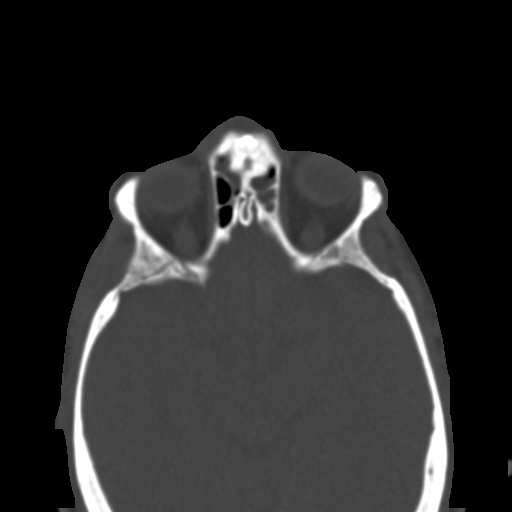
[im 85/92  bone]
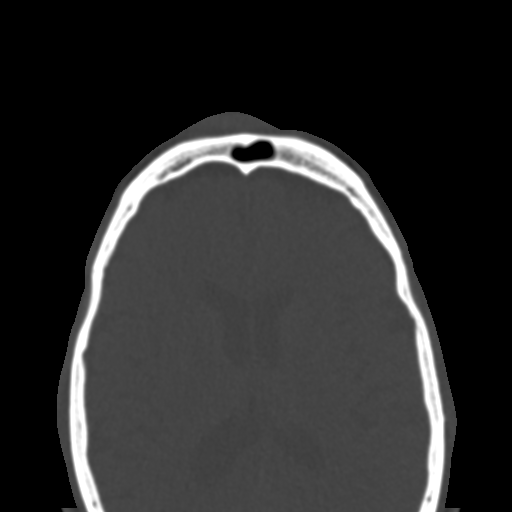

[Series 6: coronal soft · coronal · 0.42mm/px · 3 of 76 slices shown]
[im 26/76  bone]
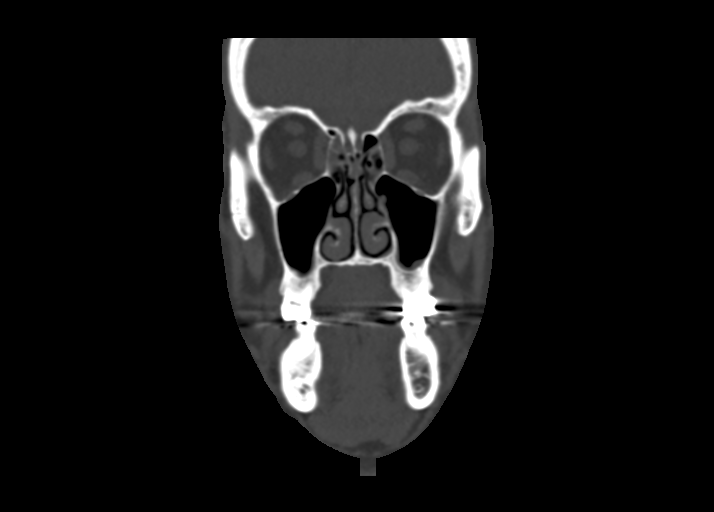
[im 34/76  bone]
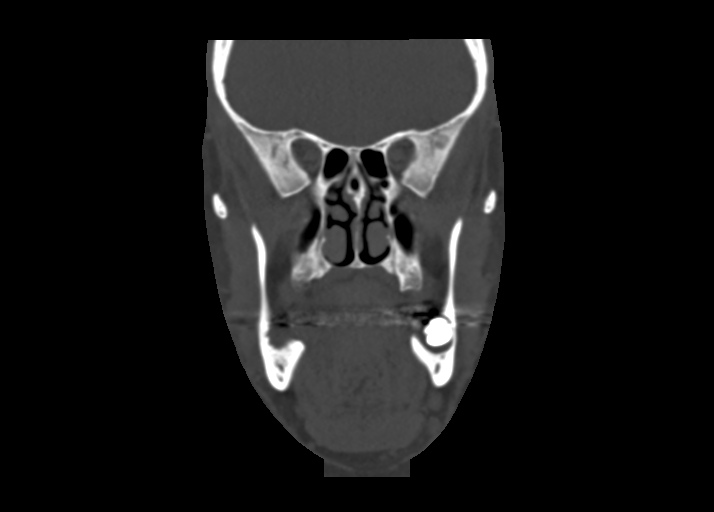
[im 42/76  bone]
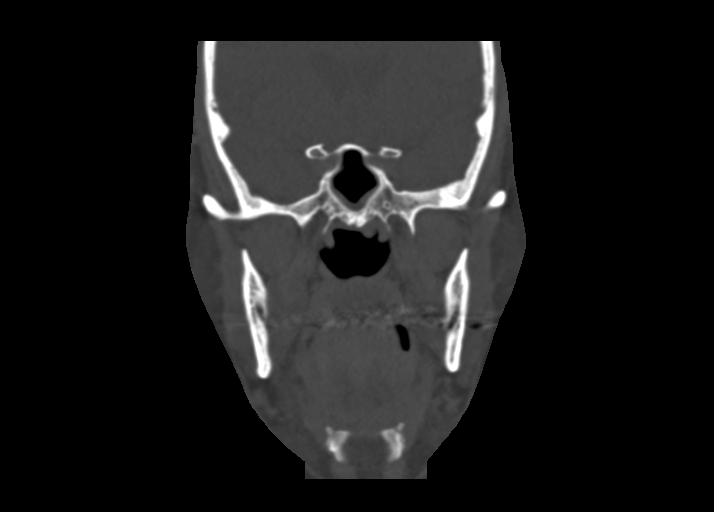

[Series 8: sagittal soft · sagittal · 0.29mm/px · 3 of 95 slices shown]
[im 32/95  bone]
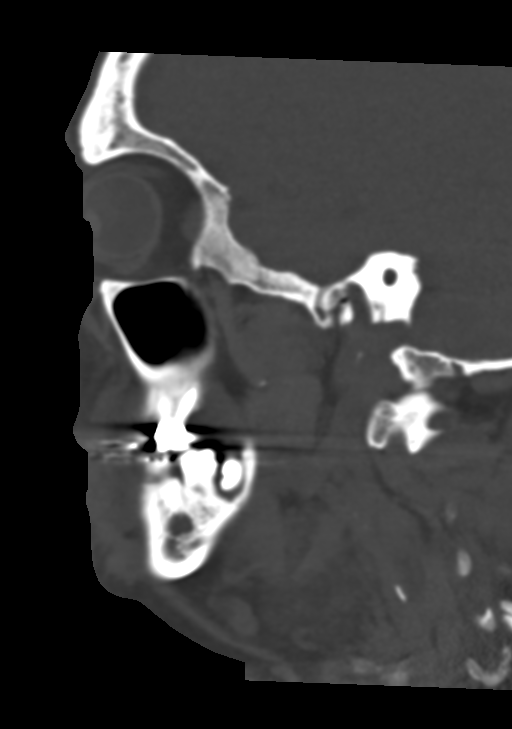
[im 48/95  bone]
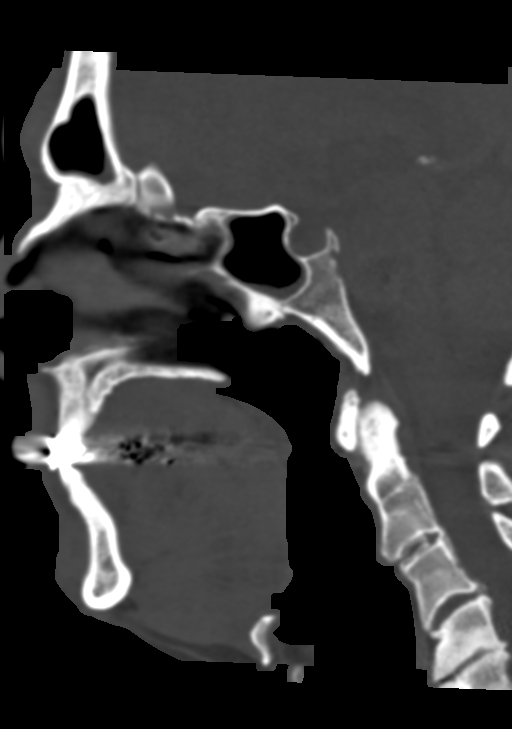
[im 63/95  bone]
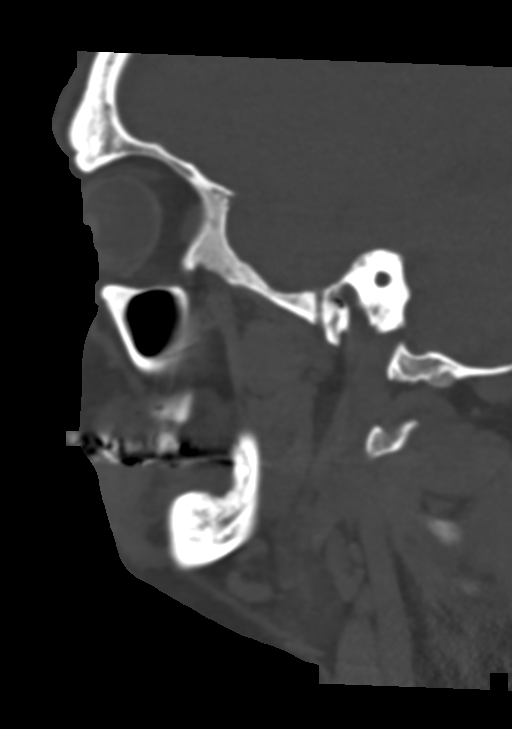

[14 of 47 positions shown; findings below may reference images not displayed]

FINDINGS: Osseous: No fracture or mandibular dislocation. No destructive
process. Multilevel degenerative changes of the cervical spine.

Orbits: Negative. No traumatic or inflammatory finding.

Sinuses: Trace fluid in the frontal and ethmoid sinuses. The
sphenoid and maxillary sinuses are clear.

Soft tissues: Mild midline frontal scalp hematoma measuring up to
approximately 5 mm in maximum thickness anterior to the frontal
sinus. There are few scattered focal radiopaque foreign bodies
within the cutaneous tissues along the frontal sinus and superior
nasal region. No subcutaneous radiopaque foreign bodies.

Limited intracranial: No significant or unexpected finding.
IMPRESSION: 1. No evidence of facial fracture.
2. Mild frontal scalp hematoma measuring up to 5 mm in thickness. No
subcutaneous radiopaque foreign bodies.

## 2021-03-02 ENCOUNTER — Telehealth: Payer: Self-pay | Admitting: *Deleted

## 2021-03-02 NOTE — Telephone Encounter (Signed)
ResMed Airsense 11 CPAP machine and N-30i mask order faxed to [employer].

## 2021-03-03 ENCOUNTER — Other Ambulatory Visit: Payer: Self-pay

## 2021-03-03 ENCOUNTER — Ambulatory Visit (HOSPITAL_COMMUNITY): Payer: BC Managed Care – PPO | Attending: Cardiology

## 2021-03-03 DIAGNOSIS — I350 Nonrheumatic aortic (valve) stenosis: Secondary | ICD-10-CM | POA: Insufficient documentation

## 2021-03-03 LAB — ECHOCARDIOGRAM COMPLETE
AR max vel: 1.39 cm2
AV Area VTI: 1.36 cm2
AV Area mean vel: 1.28 cm2
AV Mean grad: 28 mmHg
AV Peak grad: 46.3 mmHg
Ao pk vel: 3.4 m/s
Area-P 1/2: 3.08 cm2
P 1/2 time: 518 msec
S' Lateral: 3.5 cm

## 2021-03-09 ENCOUNTER — Encounter: Payer: Self-pay | Admitting: Cardiovascular Disease

## 2021-03-09 NOTE — Telephone Encounter (Signed)
Follow Up:      Pt is returning Alisha's call , concerning Echo results.

## 2021-03-09 NOTE — Telephone Encounter (Signed)
Left message to call back  

## 2021-03-15 NOTE — Telephone Encounter (Signed)
This encounter was created in error - please disregard.

## 2021-03-31 ENCOUNTER — Other Ambulatory Visit: Payer: Self-pay | Admitting: Cardiovascular Disease

## 2021-03-31 DIAGNOSIS — E785 Hyperlipidemia, unspecified: Secondary | ICD-10-CM

## 2021-04-06 ENCOUNTER — Ambulatory Visit: Payer: BC Managed Care – PPO | Admitting: Cardiovascular Disease

## 2021-04-06 ENCOUNTER — Encounter: Payer: Self-pay | Admitting: Cardiovascular Disease

## 2021-04-06 ENCOUNTER — Other Ambulatory Visit: Payer: Self-pay

## 2021-04-06 VITALS — BP 120/77 | HR 63 | Ht 73.0 in | Wt 237.6 lb

## 2021-04-06 DIAGNOSIS — G4733 Obstructive sleep apnea (adult) (pediatric): Secondary | ICD-10-CM

## 2021-04-06 DIAGNOSIS — Z720 Tobacco use: Secondary | ICD-10-CM

## 2021-04-06 DIAGNOSIS — I10 Essential (primary) hypertension: Secondary | ICD-10-CM

## 2021-04-06 DIAGNOSIS — I350 Nonrheumatic aortic (valve) stenosis: Secondary | ICD-10-CM

## 2021-04-06 NOTE — Patient Instructions (Signed)
Medication Instructions:  Your physician recommends that you continue on your current medications as directed. Please refer to the Current Medication list given to you today.  *If you need a refill on your cardiac medications before your next appointment, please call your pharmacy*   Lab Work: None ordered.   If you have labs (blood work) drawn today and your tests are completely normal, you will receive your results only by: [place] (if you have MyChart) OR A paper copy in the mail If you have any lab test that is abnormal or we need to change your treatment, we will call you to review the results.   Testing/Procedures: To be scheduled June 2023. Your physician has requested that you have an echocardiogram. Echocardiography is a painless test that uses sound waves to create images of your heart. It provides your doctor with information about the size and shape of your heart and how well your heart's chambers and valves are working. This procedure takes approximately one hour. There are no restrictions for this procedure.    Follow-Up: At [facility], you and your health needs are our priority.  As part of our continuing mission to provide you with exceptional heart care, we have created designated Provider Care Teams.  These Care Teams include your primary Cardiologist (physician) and Advanced Practice Providers (APPs -  Physician Assistants and Nurse Practitioners) who all work together to provide you with the care you need, when you need it.  We recommend signing up for the patient portal called "MyChart".  Sign up information is provided on this After Visit Summary.  MyChart is used to connect with patients for Virtual Visits (Telemedicine).  Patients are able to view lab/test results, encounter notes, upcoming appointments, etc.  Non-urgent messages can be sent to your provider as well.   To learn more about what you can do with MyChart, go to [email or web address].     Your next appointment:   Follow up after your echocardiogram in June 2023.   The format for your next appointment:   In Person  Provider:   Shelva Majestic, MD

## 2021-04-06 NOTE — Progress Notes (Signed)
Cardiology Office Note    Date:  04/06/2021   ID:  Chris Skinner, DOB November 19, 1965, MRN [id number]  PCP:  Denita Lung, MD  Cardiologist:  Shelva Majestic, MD   No chief complaint on file.  2 month F/U cardiology consultation, initially referred through the courtesy of Dr. [name]  History of Present Illness:  Chris Skinner is a 55 y.o. male who was referred for evaluation of aortic stenosis through the courtesy of Dr. [name].  I last saw him in May 2022.Marland Kitchen  He presents for follow-up evaluation  Chris Skinner has a history of hypertension, hyperlipidemia, and obstructive sleep apnea.  He has a history of tobacco use and currently smokes less than a pack per day but has been smoking for over 35 years.  He had quit smoking for year and a half, but then unfortunately resumed.  He is a Psychiatrist.  He was recently evaluated by Dr. Redmond School and was referred for follow-up echo Doppler study due to.  It is cardiac murmur.  Of note, in January 2015.  He had undergone an echo Doppler study which showed normal systolic function with an EF of 60-65%.  There was a suggestion of a small patent foramen ovale.  He had a mean aortic gradient of 10 and a peak gradient of 18 mm, suggesting very mild aortic stenosis.  At that time.  He underwent a repeat echo Doppler study on 06/21/2017, which continue to show normal LV function with an EF of 55-60%.  However, the aortic valve.  Functionally bicuspid and was calcified with mobility restriction.  He was felt to have mild to moderate aortic stenosis with a mean gradient of 22 and his peak gradient had increased to 46 mm.  There was no mention of a PFO.  There was mild TR.  He has a history of obstructive sleep apnea and in July 2017 he had undergone a sleep study which I personally reviewed.  This revealed moderate sleep apnea with an AHI of 27.9 per hour but his RDI was 42.  He had severe sleep apnea, doing rems sleep with an AHI of 56.7  per hour.  Oxygen nadir was 87%.  He has been on CPAP therapy and is followed by Dr. Annamaria Boots.  At times, he admits to being tired.  He has not had a recent download.  His D[employer] company is [employer].   He has a history of hyperlipidemia and has been on rosuvastatin 20 mg.  2 years ago, total cholesterol was 226.  Lab work in October 2018: total cholesterol 190, triglycerides 161, LDL cholesterol was 120.  Non-HDL cholesterol was 148.    When I initially saw him, his blood pressure was elevated and on repeat by me was 160/100.  Discussed with him the new hypertensive guidelines.  I recommended further titration of lisinopril from 10 mg up to 15 mg for one week and then up to 20 mg daily.  I also recommended more aggressive lipid therapy with his elevated LDL.  I discussed the importance of smoking cessation and the need for follow-up of his aortic stenosis.  I saw him in February 2019 at which time he continued to be asymptomatic.  He was exercising regularly and potentially walking up to 5 to 7 miles per day.  Unfortunately he was still smoking cigarettes.  He had quit for short duration in his early 63s but resumed again.  He denied any chest pain, presyncope or  syncope or palpitations.    Over the last several years, he has remained stable.  He was evaluated by [name] in August 2020 for preoperative clearance prior to undergoing wisdom teeth removal and was stable.  He has had issues with hypertension and hydrochlorothiazide had been added to his lisinopril and amlodipine.  When I saw him in July 2021 he denied any chest pain, PND, orthopnea, or palpitations.  His last echo Doppler study was in September 2020 which showed normal systolic function with grade 2 diastolic dysfunction.  That time his mean valve gradient had increased to 31 mm.  Aortic valve area was 1.14 cm consistent with moderate stenosis.  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  Prior to that he was football season I  recommended a follow-up echo which was done on April 01, 2020.  LV function remains normal with EF at 60 to 65%.  There was mild LVH concentrically.  He had normal diastolic parameters.  He continued to have moderate aortic stenosis with a mean gradient of 31 and peak gradient of 56, not significantly changed from his prior study.  He continues to be asymptomatic.  I saw him in May 2022 at which time he continued to be asymptomatic with reference to his aortic stenosis and specifically denied any chest pain, palpitations, presyncope or exertional dyspnea.  Unfortunately, he was still smoking approximately 3 to 4 cigarettes/day and I discussed with him the importance of complete smoking cessation.  He walks regularly without symptoms.  School will be getting out.and summer football ws to begin on Tuesday Wednesdays and Thursdays several weeks after school is completed.  He continues to use CPAP therapy with excellent compliance.   During his evaluation I recommended he undergo a follow-up echo Doppler study as well as complete set of laboratory.  Since I last saw him, he underwent an echo Doppler study on March 03, 2021 which showed normal LV function with EF at 60 to 65% with mild concentric LVH.  He had normal strain and wall motion.  There was moderate aortic valve stenosis with a mean gradient of 28 and peak gradient of 46 and is estimated aortic valve area is 1.36 cm.  There was mild aortic regurgitation.  Compared to his prior echo of 1 year previously, there was not any progression of his aortic stenosis.  He underwent laboratory which showed a TSH of 0.74, stable CBC, but studies were improved with total cholesterol now 167, triglycerides 110, HDL 45, and LDL 102 on his regimen of rosuvastatin 40 mg and Zetia 10 mg.  His blood pressure has remained stable and he is tolerating amlodipine 5 mg in addition to lisinopril HCT 20/12.5 mg.  Football practice will be initiating full-time tomorrow.  He continues to  use CPAP and he is on a waiting list to receive a new machine which is 5G compatible.  He presents for follow-up evaluation.  Past Medical History:  Diagnosis Date   Chest pain 2001   [place], normal cardiac workup   Dyslipidemia    H/O echocardiogram 1/15   PFO found incidentally.  Echo done due to murmur   Hyperlipidemia    Hypertension    Personal history of colonic polyp - adenoma 01/05/2014   Tobacco use disorder    Wears glasses     Past Surgical History:  Procedure Laterality Date   BIOPSY  01/17/2021   Procedure: BIOPSY;  Surgeon: Ladene Artist, MD;  Location: [facility] ENDOSCOPY;  Service: Endoscopy;;   COLONOSCOPY  2015   ESOPHAGOGASTRODUODENOSCOPY (EGD) WITH PROPOFOL N/A 01/17/2021   Procedure: ESOPHAGOGASTRODUODENOSCOPY (EGD) WITH PROPOFOL;  Surgeon: Ladene Artist, MD;  Location: [facility] ENDOSCOPY;  Service: Endoscopy;  Laterality: N/A;   FOREIGN BODY REMOVAL  01/17/2021   Procedure: FOREIGN BODY REMOVAL;  Surgeon: Ladene Artist, MD;  Location: [facility] ENDOSCOPY;  Service: Endoscopy;;   KNEE ARTHROSCOPY Left 1985   TONSILLECTOMY AND ADENOIDECTOMY  1974    Current Medications: Outpatient Medications Prior to Visit  Medication Sig Dispense Refill   amLODipine (NORVASC) 5 MG tablet Take 1 tablet (5 mg total) by mouth daily. 90 tablet 3   Cetirizine HCl (ZYRTEC PO) Take by mouth daily.     ezetimibe (ZETIA) 10 MG tablet TAKE 1 TABLET BY MOUTH EVERY DAY 90 tablet 3   lisinopril-hydrochlorothiazide (ZESTORETIC) 20-12.5 MG tablet Take 1 tablet by mouth daily. 90 tablet 3   Naproxen Sodium (ALEVE PO) Take by mouth daily.     Omega-3 Fatty Acids (FISH OIL OMEGA-3 PO) Take by mouth daily.     rosuvastatin (CRESTOR) 40 MG tablet TAKE 1 TABLET BY MOUTH EVERY DAY 90 tablet 1   tadalafil (CIALIS) 20 MG tablet Take 1 tablet (20 mg total) by mouth daily as needed for erectile dysfunction. 30 tablet 2   No facility-administered medications prior to visit.     Allergies:   Patient has no known  allergies.   Social History   Socioeconomic History   Marital status: Divorced    Spouse name: Not on file   Number of children: Not on file   Years of education: Not on file   Highest education level: Not on file  Occupational History   Occupation: high school PE teacher    Employer: [employer]  Tobacco Use   Smoking status: Every Day   Smokeless tobacco: Former    Types: Chew    Quit date: 2009  Substance and Sexual Activity   Alcohol use: Yes    Alcohol/week: 10.0 standard drinks    Types: 10 Cans of beer per week   Drug use: No   Sexual activity: Not on file    Comment: teach PE in high school, coaches football, exercises - walks 5 days per week; separated  Other Topics Concern   Not on file  Social History Narrative   Exercise - calisthenics, walking in afternoons 3-5 days per week, single, 2 children   Social Determinants of Health   Financial Resource Strain: Not on file  Food Insecurity: Not on file  Transportation Needs: Not on file  Physical Activity: Not on file  Stress: Not on file  Social Connections: Not on file     Family History:  The patient's  family history includes Arthritis in his mother; Crohn's disease in his sister; Diabetes in his father and paternal grandfather; Heart disease in his father and mother; Hyperlipidemia in his mother; Stroke in his mother.   ROS General: Negative; No fevers, chills, or night sweats;  HEENT: Negative; No changes in vision or hearing, sinus congestion, difficulty swallowing Pulmonary: Negative; No cough, wheezing, shortness of breath, hemoptysis Cardiovascular: Negative; No chest pain, presyncope, syncope, palpitations GI: Negative; No nausea, vomiting, diarrhea, or abdominal pain GU: Negative; No dysuria, hematuria, or difficulty voiding Musculoskeletal: Negative; no myalgias, joint pain, or weakness Hematologic/Oncology: Negative; no easy bruising, bleeding Endocrine:  Negative; no heat/cold intolerance; no diabetes Neuro: Negative; no changes in balance, headaches Skin: Negative;  No rashes or skin lesions Psychiatric: Negative; No behavioral problems, depression Sleep: Negative; No snoring, daytime sleepiness, hypersomnolence, bruxism, restless legs, hypnogognic hallucinations, no cataplexy Other comprehensive 14 point system review is negative.   PHYSICAL EXAM:   VS:  BP 120/77   Pulse 63   Ht $R'6\' 1"'vj$  (1.854 m)   Wt 237 lb 9.6 oz (107.8 kg)   SpO2 95%   BMI 31.35 kg/m     Repeat blood pressure by me was 124/70  Wt Readings from Last 3 Encounters:  04/06/21 237 lb 9.6 oz (107.8 kg)  02/03/21 238 lb 12.8 oz (108.3 kg)  01/17/21 235 lb (106.6 kg)    General: Alert, oriented, no distress.  Skin: normal turgor, no rashes, warm and dry HEENT: Normocephalic, atraumatic. Pupils equal round and reactive to light; sclera anicteric; extraocular muscles intact;  Nose without nasal septal hypertrophy Mouth/Parynx benign; Mallinpatti scale 3 Neck: No JVD, no carotid bruits; normal carotid upstroke Lungs: clear to ausculatation and percussion; no wheezing or rales Chest wall: without tenderness to palpitation Heart: PMI not displaced, RRR, s1 s2 normal, 2/6 mid peaking systolic murmur consistent with his moderate aortic stenosis; trivial diastolic murmur, no rubs, gallops, thrills, or heaves Abdomen: soft, nontender; no hepatosplenomehaly, BS+; abdominal aorta nontender and not dilated by palpation. Back: no CVA tenderness Pulses 2+ Musculoskeletal: full range of motion, normal strength, no joint deformities Extremities: no clubbing cyanosis or edema, Homan's sign negative  Neurologic: grossly nonfocal; Cranial nerves grossly wnl Psychologic: Normal mood and affect    EKG:  EKG is ordered today.  ECG (independently read by me): Normal sinus rhythm at 63 bpm.  Nonspecific ST abnormality.  No ectopy.  Normal intervals.  May 2022 ECG (independently read  by me): Sinus rhythm with occasional PVCs at 63 bpm.  QTc interval 446 ms  July 2021 ECG (independently read by me): NSR at 64; no ectopy; early trasition  February 2019 ECG (independently read by me): Normal sinus rhythm at 61 bpm.  No ectopy.  Normal intervals.  November 2018 ECG (independently read by me): Normal sinus rhythm at 60 bpm.  PR interval 200 ms.  No ST segment changes.  QTc interval 430 ms   Recent Labs: BMP Latest Ref Rng & Units 02/09/2021 01/17/2021 04/02/2020  Glucose 65 - 99 mg/dL 119(H) 112(H) 112(H)  BUN 6 - 24 mg/dL $Remove'18 17 20  'TrQASxe$ Creatinine 0.76 - 1.27 mg/dL 0.92 0.79 0.83  BUN/Creat Ratio 9 - 20 20 - 24(H)  Sodium 134 - 144 mmol/L 141 136 139  Potassium 3.5 - 5.2 mmol/L 4.7 3.9 4.6  Chloride 96 - 106 mmol/L 104 104 102  CO2 20 - 29 mmol/L $RemoveB'21 23 24  'uPnBIpux$ Calcium 8.7 - 10.2 mg/dL 9.4 9.2 9.5     Hepatic Function Latest Ref Rng & Units 02/09/2021 01/17/2021 04/02/2020  Total Protein 6.0 - 8.5 g/dL 6.8 7.1 6.7  Albumin 3.8 - 4.9 g/dL 4.7 4.2 4.3  AST 0 - 40 IU/L 27 31 34  ALT 0 - 44 IU/L 37 42 46(H)  Alk Phosphatase 44 - 121 IU/L 55 47 57  Total Bilirubin 0.0 - 1.2 mg/dL 0.7 1.2 0.7    CBC Latest Ref Rng & Units 02/09/2021 01/17/2021 04/02/2020  WBC 3.4 - 10.8 x10E3/uL 8.6 13.2(H) 9.0  Hemoglobin 13.0 - 17.7 g/dL 15.2 16.0 15.4  Hematocrit 37.5 - 51.0 % 44.3 44.6 43.2  Platelets 150 - 450 x10E3/uL 300 323 306   Lab Results  Component Value Date  MCV 94 02/09/2021   MCV 90.3 01/17/2021   MCV 89 04/02/2020   Lab Results  Component Value Date   TSH 0.741 02/09/2021   Lab Results  Component Value Date   HGBA1C 5.9 (A) 04/26/2020     BNP No results found for: BNP  ProBNP No results found for: PROBNP   Lipid Panel     Component Value Date/Time   CHOL 167 02/09/2021 0822   TRIG 110 02/09/2021 0822   HDL 45 02/09/2021 0822   CHOLHDL 3.7 02/09/2021 0822   CHOLHDL 4.5 06/18/2017 1312   VLDL 34 11/04/2014 0958   LDLCALC 102 (H) 02/09/2021 0822   LDLCALC 120 (H)  06/18/2017 1312     RADIOLOGY: No results found.   Additional studies/ records that were reviewed today include:  I reviewed the patient's prior echo Doppler study from 09/29/2013 and is most recent study of 06/21/2017.  I also personally reviewed his diagnostic polysomnogram from 03/27/2016 and recent laboratory and records from Dr. Redmond School.  ECHO; 05/21/2019 IMPRESSIONS   1. The average left ventricular global longitudinal strain is -21.4 %.   2. The left ventricle has normal systolic function, with an ejection  fraction of 55-60%. The cavity size was normal. Left ventricular diastolic  Doppler parameters are consistent with pseudonormalization. No evidence of  left ventricular regional wall  motion abnormalities.   3. The right ventricle has normal systolic function. The cavity was  normal. There is no increase in right ventricular wall thickness. Right  ventricular systolic pressure could not be assessed.   4. Left atrial size was mildly dilated.   5. There is mild to moderate mitral annular calcification present.   6. The aortic valve is tricuspid. Moderate thickening of the aortic  valve. Severe calcifcation of the aortic valve. Moderate stenosis of the  aortic valve. Mean AVG 18mmHg. AVA 1.14cm2. Dimensionless index 36.   7. The aorta is normal unless otherwise noted.   8. Compared to prior echo the mean AVG has increased from 25 to 68mmHg.    ECHO: 03/03/2021 IMPRESSIONS   1. Left ventricular ejection fraction, by estimation, is 60 to 65%. Left  ventricular ejection fraction by 3D volume is 63 %. The left ventricle has  normal function. The left ventricle has no regional wall motion  abnormalities. There is mild concentric  left ventricular hypertrophy. Left ventricular diastolic parameters were  normal. The average left ventricular global longitudinal strain is -22.3  %. The global longitudinal strain is normal.   2. Right ventricular systolic function is normal. The  right ventricular  size is normal.   3. The mitral valve is grossly normal. Trivial mitral valve  regurgitation.   4. The aortic valve is calcified. There is moderate calcification of the  aortic valve. There is moderate thickening of the aortic valve. Aortic  valve regurgitation is mild. Moderate aortic valve stenosis. Aortic valve  area, by VTI measures 1.36 cm.  Aortic valve mean gradient measures 28.0 mmHg. Aortic valve Vmax measures  3.40 m/s.   5. The inferior vena cava is normal in size with greater than 50%  respiratory variability, suggesting right atrial pressure of 3 mmHg.   Comparison(s): No significant change from prior study.   ASSESSMENT:    1. Moderate aortic stenosis   2. Essential hypertension   3. OSA (obstructive sleep apnea)   4. Tobacco abuse      PLAN:  Chris Skinner is a 55 year-old gentleman who has a history of hypertension  and hyperlipidemia.   When I initially saw him he was hypertensive with stage II hypertension despite being on lisinopril 10 mg.  At that time I further titrated lisinopril to 20 mg daily.  He was on rosuvastatin for hyperlipidemia.  His initial echo Doppler study in January 2015 showed mild aortic stenosis with a mean gradient of 10, and peak gradient of 18 mm.  He had normal LV function.  A subsequent echo 3 years later in October 2018 showed an EF of 55 to 60% and there was slight progression of his AS now was mild to moderate with a mean gradient of 22 and peak gradient at 46 mm.  An echo in 2020 showed additional progression of his aortic stenosis with an EF of 55 to 60% and grade 2 diastolic dysfunction.  Mean gradient was now 31 mm and peak gradient was 55.8 mm.  Aortic valve area was 1.1 cm consistent with at least moderate aortic stenosis.  Hisecho in July 2021 essentially was unchanged with EF 60 to 65%, mild concentric LVH, normal diastolic parameters, and moderate aortic stenosis with a mean gradient of 31 and peak gradient of  50.1.  Aortic valve area was 0.93 cm.  When I saw him in May 2022, he continued to be asymptomatic.  He had been exercising more and felt well.  I reviewed with him today in detail his most recent echo Doppler study from March 03, 2021 which showed a mean gradient of 28 with peak gradient of 46 and estimated valve area was somewhat improved at 1.36 cm.  There was mild aortic regurgitation.  There was evidence for normal LV function with EF 60 to 65% with mild concentric LVH.  His blood pressure today is excellent on his current regimen of amlodipine 5 mg, lisinopril HCT 20/12.5 mg, and a repeat by me today was 124/70.  His lipid studies have improved on Zetia 10 mg and rosuvastatin 40 mg although I discussed trying to get LDL cholesterol lower if possible.  He remains asymptomatic.  He continues to use CPAP with 100% compliance.  I calculated new Epworth Sleepiness Scale score today and this endorsed at 5 arguing against residual daytime sleepiness.  He is on the wait list to receive a new ResMed air sense 11 CPAP machine which unfortunately is having supply chain issues.  I again discussed the importance of complete smoking cessation.  In June 2023 I am recommending he undergo a 1 year follow-up echo Doppler study to reassess his aortic stenosis.  I again discussed the potential symptoms associated with aortic stenosis and he is aware if he were to develop the symptoms between now and then to contact me for follow-up evaluation.  Otherwise I will see him in June 2023 in follow-up of his echo Doppler study.    Medication Adjustments/Labs and Tests Ordered: Current medicines are reviewed at length with the patient today.  Concerns regarding medicines are outlined above.  Medication changes, Labs and Tests ordered today are listed in the Patient Instructions below. Patient Instructions  Medication Instructions:  Your physician recommends that you continue on your current medications as directed. Please refer  to the Current Medication list given to you today.  *If you need a refill on your cardiac medications before your next appointment, please call your pharmacy*   Lab Work: None ordered.   If you have labs (blood work) drawn today and your tests are completely normal, you will receive your results only by: [place] (if you  have MyChart) OR A paper copy in the mail If you have any lab test that is abnormal or we need to change your treatment, we will call you to review the results.   Testing/Procedures: To be scheduled June 2023. Your physician has requested that you have an echocardiogram. Echocardiography is a painless test that uses sound waves to create images of your heart. It provides your doctor with information about the size and shape of your heart and how well your heart's chambers and valves are working. This procedure takes approximately one hour. There are no restrictions for this procedure.    Follow-Up: At [facility], you and your health needs are our priority.  As part of our continuing mission to provide you with exceptional heart care, we have created designated Provider Care Teams.  These Care Teams include your primary Cardiologist (physician) and Advanced Practice Providers (APPs -  Physician Assistants and Nurse Practitioners) who all work together to provide you with the care you need, when you need it.  We recommend signing up for the patient portal called "MyChart".  Sign up information is provided on this After Visit Summary.  MyChart is used to connect with patients for Virtual Visits (Telemedicine).  Patients are able to view lab/test results, encounter notes, upcoming appointments, etc.  Non-urgent messages can be sent to your provider as well.   To learn more about what you can do with MyChart, go to [email or web address].    Your next appointment:   Follow up after your echocardiogram in June 2023.   The format for your next appointment:   In  Person  Provider:   Shelva Majestic, MD      Signed, Shelva Majestic, MD  04/06/2021 9:06 AM    [facility] [address], [place], [place], [place]  [address] Phone: [phone]

## 2021-04-17 ENCOUNTER — Other Ambulatory Visit: Payer: Self-pay | Admitting: Family Medicine

## 2021-04-17 DIAGNOSIS — I10 Essential (primary) hypertension: Secondary | ICD-10-CM

## 2021-04-20 ENCOUNTER — Other Ambulatory Visit: Payer: Self-pay | Admitting: Family Medicine

## 2021-04-20 DIAGNOSIS — I10 Essential (primary) hypertension: Secondary | ICD-10-CM

## 2021-05-04 ENCOUNTER — Telehealth: Payer: Self-pay

## 2021-05-04 NOTE — Telephone Encounter (Signed)
Pt was advised that he does not need to fast. [facility]

## 2021-05-04 NOTE — Telephone Encounter (Signed)
Pt. Called stating he has a CPE here tomorrow he was wondering if he needed to fast for this apt. He stated he had fasting blood work done in June with his Cardiologist. They checked his cholesterol then.

## 2021-05-05 ENCOUNTER — Ambulatory Visit: Payer: BC Managed Care – PPO | Admitting: Family Medicine

## 2021-05-05 ENCOUNTER — Other Ambulatory Visit: Payer: Self-pay

## 2021-05-05 ENCOUNTER — Encounter: Payer: Self-pay | Admitting: Family Medicine

## 2021-05-05 VITALS — BP 120/80 | HR 66 | Temp 97.8°F | Ht 72.0 in | Wt 235.8 lb

## 2021-05-05 DIAGNOSIS — Z86010 Personal history of colonic polyps: Secondary | ICD-10-CM | POA: Diagnosis not present

## 2021-05-05 DIAGNOSIS — Z833 Family history of diabetes mellitus: Secondary | ICD-10-CM

## 2021-05-05 DIAGNOSIS — E7439 Other disorders of intestinal carbohydrate absorption: Secondary | ICD-10-CM

## 2021-05-05 DIAGNOSIS — Z8042 Family history of malignant neoplasm of prostate: Secondary | ICD-10-CM

## 2021-05-05 DIAGNOSIS — I350 Nonrheumatic aortic (valve) stenosis: Secondary | ICD-10-CM | POA: Diagnosis not present

## 2021-05-05 DIAGNOSIS — Z0000 Encounter for general adult medical examination without abnormal findings: Secondary | ICD-10-CM

## 2021-05-05 DIAGNOSIS — E785 Hyperlipidemia, unspecified: Secondary | ICD-10-CM

## 2021-05-05 DIAGNOSIS — I10 Essential (primary) hypertension: Secondary | ICD-10-CM

## 2021-05-05 DIAGNOSIS — N529 Male erectile dysfunction, unspecified: Secondary | ICD-10-CM

## 2021-05-05 DIAGNOSIS — F17200 Nicotine dependence, unspecified, uncomplicated: Secondary | ICD-10-CM

## 2021-05-05 DIAGNOSIS — G4730 Sleep apnea, unspecified: Secondary | ICD-10-CM

## 2021-05-05 DIAGNOSIS — E669 Obesity, unspecified: Secondary | ICD-10-CM

## 2021-05-05 NOTE — Progress Notes (Signed)
   Subjective:    Patient ID: Chris Skinner, male    DOB: 20-Feb-1966, 55 y.o.   MRN: [id number]  HPI He is here for complete examination.  He does have underlying aortic stenosis and is being followed regularly by cardiology.  At this point he is not having any major shortness of breath, PND or DOE.  He continues on Crestor and Zetia for his lipids.  He does have sleep apnea and is doing well with that.  He continues on amlodipine and lisinopril/HCTZ for his blood pressure.  Does have history of colonic polyps and will be scheduled for follow-up colonoscopy.  He continues to smoke and at this point is not interested in quitting.  He does use Cialis for ED and it is beneficial.  He recognizes the need for him to lose weight but has been unable to make much of an effort.  Does have a family history of prostate cancer as well as diabetes.  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  Family and social history as well as health maintenance and immunizations was reviewed.   Review of Systems  All other systems reviewed and are negative.     Objective:   Physical Exam Alert and in no distress. Tympanic membranes and canals are normal. Pharyngeal area is normal. Neck is supple without adenopathy or thyromegaly. Cardiac exam shows a regular sinus rhythm without murmurs or gallops. Lungs are clear to auscultation.  Abdominal exam shows no masses or tenderness. Recent blood work was reviewed.      Assessment & Plan:  Dyslipidemia, goal LDL below 100  Moderate aortic stenosis  Sleep apnea, unspecified type  History of colonic polyps - Plan: Ambulatory referral to Gastroenterology  Erectile dysfunction, unspecified erectile dysfunction type  Family history of prostate cancer in father - Plan: PSA  Family history of diabetes mellitus  Glucose intolerance  Obesity (BMI 30.0-34.9)  Smoker  Essential hypertension He will continue on his  present medication regimen.  Referral made for follow-up on the colonic polyp.  Continue to monitor CPAP.  I will check a PSA on him because of the family history.  He will continue to follow-up with cardiology concerning the aortic stenosis.

## 2021-05-06 LAB — PSA: Prostate Specific Ag, Serum: 1.3 ng/mL (ref 0.0–4.0)

## 2021-05-09 ENCOUNTER — Telehealth: Payer: Self-pay

## 2021-05-09 DIAGNOSIS — Z8616 Personal history of COVID-19: Secondary | ICD-10-CM | POA: Insufficient documentation

## 2021-05-09 NOTE — Telephone Encounter (Signed)
Pt. Called stating that he tested positive for covid on Friday and he was here last Thursday. I will update his medical history.

## 2021-05-17 ENCOUNTER — Encounter: Payer: Self-pay | Admitting: Internal Medicine

## 2021-05-26 ENCOUNTER — Other Ambulatory Visit: Payer: Self-pay

## 2021-05-26 ENCOUNTER — Ambulatory Visit: Payer: BC Managed Care – PPO | Admitting: Family Medicine

## 2021-05-26 ENCOUNTER — Encounter: Payer: Self-pay | Admitting: Family Medicine

## 2021-05-26 VITALS — BP 120/80 | HR 69 | Temp 97.4°F | Wt 238.6 lb

## 2021-05-26 DIAGNOSIS — E7439 Other disorders of intestinal carbohydrate absorption: Secondary | ICD-10-CM

## 2021-05-26 DIAGNOSIS — R42 Dizziness and giddiness: Secondary | ICD-10-CM

## 2021-05-26 DIAGNOSIS — R7309 Other abnormal glucose: Secondary | ICD-10-CM | POA: Diagnosis not present

## 2021-05-26 LAB — POCT GLYCOSYLATED HEMOGLOBIN (HGB A1C): Hemoglobin A1C: 6.1 % — AB (ref 4.0–5.6)

## 2021-05-26 NOTE — Progress Notes (Signed)
   Subjective:    Patient ID: Chris Skinner, male    DOB: 1966-08-24, 55 y.o.   MRN: [id number]  HPI He is here for consult concerning a recent trip to the emergency room in [place] for evaluation of an episode of being lightheaded.  He was at work standing and noted the onset of being lightheaded but did not change with head position.  There is no blurred vision, double vision, nausea, vomiting, weakness.  He was seen in the emergency room.  Apparently chest x-ray, EKG and blood work was done.  He states that the symptoms lasted roughly 30 minutes.  This occurred on Monday and since then he has been asymptomatic.  Review of his record here indicates he does have an elevated blood sugar in the past.   Review of Systems     Objective:   Physical Exam Alert and in no distress.  EOMI.  Normal cerebellar.  DTRs normal.  Normal finger-to-nose.  Tympanic membranes and canals are normal. Pharyngeal area is normal. Neck is supple without adenopathy or thyromegaly. Cardiac exam shows a regular sinus rhythm without murmurs or gallops. Lungs are clear to auscultation.  Hemoglobin A1c is 6.1      Assessment & Plan:  Lightheadedness  Glucose intolerance I reassured him that I saw nothing of any major concern for the lightheadedness and for him to be vigilant to look for any of the symptoms that might occur with it when this occurs again if indeed it does.  I then discussed glucose intolerance with him in terms of risk for diabetes.  Reinforced the need for him to continue to make dietary changes as he does keep himself fairly physically active.

## 2021-05-26 NOTE — Addendum Note (Signed)
Addended by: Elyse Jarvis on: 05/26/2021 09:32 AM   Modules accepted: Orders

## 2021-06-29 ENCOUNTER — Other Ambulatory Visit: Payer: Self-pay

## 2021-06-29 ENCOUNTER — Ambulatory Visit (AMBULATORY_SURGERY_CENTER): Payer: BC Managed Care – PPO

## 2021-06-29 ENCOUNTER — Encounter: Payer: Self-pay | Admitting: Internal Medicine

## 2021-06-29 VITALS — Ht 73.0 in | Wt 235.0 lb

## 2021-06-29 DIAGNOSIS — Z86010 Personal history of colonic polyps: Secondary | ICD-10-CM

## 2021-06-29 NOTE — Progress Notes (Signed)
    Patient's pre-visit was done today over the phone with the patient   Name,DOB and address verified.   Patient denies any allergies to Eggs and Soy.  Patient denies any problems with anesthesia/sedation. Patient denies taking diet pills or blood thinners.  Denies atrial flutter or atrial fib Denies chronic constipation No home Oxygen.   Packet of Prep instructions mailed to patient including a copy of a consent form-pt is aware.  Patient understands to call us back with any questions or concerns.  Patient is aware of our care-partner policy and [id number] safety protocol.      Pt states is 6'1

## 2021-07-15 ENCOUNTER — Other Ambulatory Visit: Payer: Self-pay | Admitting: Cardiovascular Disease

## 2021-07-15 DIAGNOSIS — E785 Hyperlipidemia, unspecified: Secondary | ICD-10-CM

## 2021-07-26 ENCOUNTER — Other Ambulatory Visit: Payer: Self-pay

## 2021-07-26 ENCOUNTER — Encounter: Payer: Self-pay | Admitting: Internal Medicine

## 2021-07-26 ENCOUNTER — Ambulatory Visit (AMBULATORY_SURGERY_CENTER): Payer: BC Managed Care – PPO | Admitting: Internal Medicine

## 2021-07-26 VITALS — BP 112/63 | HR 53 | Temp 98.2°F | Resp 10 | Ht 72.0 in | Wt 235.0 lb

## 2021-07-26 DIAGNOSIS — D124 Benign neoplasm of descending colon: Secondary | ICD-10-CM | POA: Diagnosis not present

## 2021-07-26 DIAGNOSIS — Z86010 Personal history of colonic polyps: Secondary | ICD-10-CM

## 2021-07-26 MED ORDER — SODIUM CHLORIDE 0.9 % IV SOLN: 500.0000 mL | Freq: Once | INTRAVENOUS | Status: DC

## 2021-07-26 NOTE — Patient Instructions (Signed)
Discharge instructions given. Handouts on polyps,diverticulosis and hemorrhoids. Resume previous medications. YOU HAD AN ENDOSCOPIC PROCEDURE TODAY AT [facility]:   Refer to the procedure report that was given to you for any specific questions about what was found during the examination.  If the procedure report does not answer your questions, please call your gastroenterologist to clarify.  If you requested that your care partner not be given the details of your procedure findings, then the procedure report has been included in a sealed envelope for you to review at your convenience later.  YOU SHOULD EXPECT: Some feelings of bloating in the abdomen. Passage of more gas than usual.  Walking can help get rid of the air that was put into your GI tract during the procedure and reduce the bloating. If you had a lower endoscopy (such as a colonoscopy or flexible sigmoidoscopy) you may notice spotting of blood in your stool or on the toilet paper. If you underwent a bowel prep for your procedure, you may not have a normal bowel movement for a few days.  Please Note:  You might notice some irritation and congestion in your nose or some drainage.  This is from the oxygen used during your procedure.  There is no need for concern and it should clear up in a day or so.  SYMPTOMS TO REPORT IMMEDIATELY:  Following lower endoscopy (colonoscopy or flexible sigmoidoscopy):  Excessive amounts of blood in the stool  Significant tenderness or worsening of abdominal pains  Swelling of the abdomen that is new, acute  Fever of 100F or higher   For urgent or emergent issues, a gastroenterologist can be reached at any hour by calling [phone]. Do not use MyChart messaging for urgent concerns.    DIET:  We do recommend a small meal at first, but then you may proceed to your regular diet.  Drink plenty of fluids but you should avoid alcoholic beverages for 24 hours.  ACTIVITY:  You should  plan to take it easy for the rest of today and you should NOT DRIVE or use heavy machinery until tomorrow (because of the sedation medicines used during the test).    FOLLOW UP: Our staff will call the number listed on your records 48-72 hours following your procedure to check on you and address any questions or concerns that you may have regarding the information given to you following your procedure. If we do not reach you, we will leave a message.  We will attempt to reach you two times.  During this call, we will ask if you have developed any symptoms of COVID 19. If you develop any symptoms (ie: fever, flu-like symptoms, shortness of breath, cough etc.) before then, please call [phone].  If you test positive for Covid 19 in the 2 weeks post procedure, please call and report this information to [place].    If any biopsies were taken you will be contacted by phone or by letter within the next 1-3 weeks.  Please call us at [phone] if you have not heard about the biopsies in 3 weeks.    SIGNATURES/CONFIDENTIALITY: You and/or your care partner have signed paperwork which will be entered into your electronic medical record.  These signatures attest to the fact that that the information above on your After Visit Summary has been reviewed and is understood.  Full responsibility of the confidentiality of this discharge information lies with you and/or your care-partner.

## 2021-07-26 NOTE — Progress Notes (Signed)
Called to room to assist during endoscopic procedure.  Patient ID and intended procedure confirmed with present staff. Received instructions for my participation in the procedure from the performing physician.  

## 2021-07-26 NOTE — Progress Notes (Signed)
VS-CW  Pt's states no medical or surgical changes since previsit or office visit.  

## 2021-07-26 NOTE — Progress Notes (Signed)
[facility] History and Physical   Primary Care Physician:  Denita Lung, MD   Reason for Procedure:   Hx colon polyp  Plan:    colonoscopy     HPI: Chris Skinner is a 55 y.o. 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  Echo done due to murmur   Heart murmur    mild aortic stenosis   Hyperlipidemia    Hypertension    Personal history of colonic polyp - adenoma 01/05/2014   Sleep apnea    cpap   Tobacco use disorder    Wears glasses     Past Surgical History:  Procedure Laterality Date   BIOPSY  01/17/2021   Procedure: BIOPSY;  Surgeon: Ladene Artist, MD;  Location: [facility] ENDOSCOPY;  Service: Endoscopy;;   COLONOSCOPY  2015   ESOPHAGOGASTRODUODENOSCOPY (EGD) WITH PROPOFOL N/A 01/17/2021   Procedure: ESOPHAGOGASTRODUODENOSCOPY (EGD) WITH PROPOFOL;  Surgeon: Ladene Artist, MD;  Location: [facility] ENDOSCOPY;  Service: Endoscopy;  Laterality: N/A;   FOREIGN BODY REMOVAL  01/17/2021   Procedure: FOREIGN BODY REMOVAL;  Surgeon: Ladene Artist, MD;  Location: [facility] ENDOSCOPY;  Service: Endoscopy;;   KNEE ARTHROSCOPY [place]    Prior to Admission medications   Medication Sig Start Date End Date Taking? Authorizing Provider  amLODipine (NORVASC) 5 MG tablet TAKE 1 TABLET BY MOUTH EVERY DAY 04/20/21  Yes Denita Lung, MD  Cetirizine HCl (ZYRTEC PO) Take by mouth daily.   Yes [provider]  ezetimibe (ZETIA) 10 MG tablet TAKE 1 TABLET BY MOUTH EVERY DAY 03/31/21  Yes Troy Sine, MD  lisinopril-hydrochlorothiazide (ZESTORETIC) 20-12.5 MG tablet TAKE 1 TABLET BY MOUTH EVERY DAY 04/20/21  Yes Denita Lung, MD  Naproxen Sodium (ALEVE PO)  Take by mouth daily.   Yes [provider]  Omega-3 Fatty Acids (FISH OIL OMEGA-3 PO) Take by mouth daily.   Yes [provider]  rosuvastatin (CRESTOR) 40 MG tablet TAKE 1 TABLET BY MOUTH EVERY DAY 07/15/21  Yes Troy Sine, MD  tadalafil (CIALIS) 20 MG tablet Take 1 tablet (20 mg total) by mouth daily as needed for erectile dysfunction. 05/13/20   Denita Lung, MD    Current Outpatient Medications  Medication Sig Dispense Refill   amLODipine (NORVASC) 5 MG tablet TAKE 1 TABLET BY MOUTH EVERY DAY 90 tablet 3   Cetirizine HCl (ZYRTEC PO) Take by mouth daily.     ezetimibe (ZETIA) 10 MG tablet TAKE 1 TABLET BY MOUTH EVERY DAY 90 tablet 3   lisinopril-hydrochlorothiazide (ZESTORETIC) 20-12.5 MG tablet TAKE 1 TABLET BY MOUTH EVERY DAY 90 tablet 0   Naproxen Sodium (ALEVE PO) Take by mouth daily.     Omega-3 Fatty Acids (FISH OIL OMEGA-3 PO) Take by mouth daily.     rosuvastatin (CRESTOR) 40 MG tablet TAKE 1 TABLET BY MOUTH EVERY DAY 90 tablet 3   tadalafil (CIALIS) 20 MG tablet Take 1 tablet (20 mg total) by mouth daily as needed for erectile dysfunction. 30 tablet 2   Current Facility-Administered Medications  Medication Dose Route Frequency Provider Last Rate Last Admin   0.9 %  sodium chloride  infusion  500 mL Intravenous Once Gatha Mayer, MD        Allergies as of 07/26/2021   (No Known Allergies)    Family History  Problem Relation Age of Onset   Hyperlipidemia Mother    Heart disease Mother    Arthritis Mother    Stroke Mother    Heart disease Father    Diabetes Father        borderline   Crohn's disease Sister    Diabetes Paternal Grandfather    Cancer Neg Hx    Colon cancer Neg Hx    Colon polyps Neg Hx    Esophageal cancer Neg Hx    Stomach cancer Neg Hx    Rectal cancer Neg Hx     Social History   Socioeconomic History   Marital status: Divorced    Spouse name: Not on file   Number of children: Not on file   Years of education: Not  on file   Highest education level: Not on file  Occupational History   Occupation: high school PE teacher    Employer: [place]: [employer]  Tobacco Use   Smoking status: Every Day   Smokeless tobacco: Former    Types: Chew    Quit date: 2009  Vaping Use   Vaping Use: Former  Substance and Sexual Activity   Alcohol use: Yes    Alcohol/week: 10.0 standard drinks    Types: 10 Cans of beer per week   Drug use: No   Sexual activity: Not Currently    Comment: teach PE in high school, coaches football, exercises - walks 5 days per week; separated  Other Topics Concern   Not on file  Social History Narrative   Exercise - calisthenics, walking in afternoons 3-5 days per week, single, 2 children   Social Determinants of Health   Financial Resource Strain: Not on file  Food Insecurity: Not on file  Transportation Needs: Not on file  Physical Activity: Not on file  Stress: Not on file  Social Connections: Not on file  Intimate Partner Violence: Not on file    Review of Systems:  All other review of systems negative except as mentioned in the HPI.  Physical Exam: Vital signs BP 120/88   Pulse 62   Temp 98.2 F (36.8 C) (Temporal)   Resp 14   Ht 6' (1.829 m)   Wt 235 lb (106.6 kg)   SpO2 97%   BMI 31.87 kg/m   General:   Alert,  Well-developed, well-nourished, pleasant and cooperative in NAD Lungs:  Clear throughout to auscultation.   Heart:  Regular rate and rhythm; 3/6 systolic murmur, clicks, rubs,  or gallops. Abdomen:  Soft, nontender and nondistended. Normal bowel sounds.   Neuro/Psych:  Alert and cooperative. Normal mood and affect. A and O x 3   @[place]  Simonne Maffucci, MD, [facility] [phone] (pager) 07/26/2021 9:14 AM@

## 2021-07-26 NOTE — Progress Notes (Signed)
A and O x3. Report to RN. Tolerated MAC anesthesia well. 

## 2021-07-26 NOTE — Op Note (Signed)
[facility] Patient Name: Chris Skinner Procedure Date: 07/26/2021 9:10 AM MRN: [id number] Endoscopist: Gatha Mayer , MD Age: 55 Referring MD:  Date of Birth: 1965/11/03 Gender: Male Account #: [id number] Procedure:                Colonoscopy Indications:              Surveillance: Personal history of adenomatous                            polyps on last colonoscopy > 5 years ago, Last                            colonoscopy: 2015 Medicines:                Propofol per Anesthesia, Monitored Anesthesia Care Procedure:                Pre-Anesthesia Assessment:                           - Prior to the procedure, a History and Physical                            was performed, and patient medications and                            allergies were reviewed. The patient's tolerance of                            previous anesthesia was also reviewed. The risks                            and benefits of the procedure and the sedation                            options and risks were discussed with the patient.                            All questions were answered, and informed consent                            was obtained. Prior Anticoagulants: The patient has                            taken no previous anticoagulant or antiplatelet                            agents. ASA Grade Assessment: II - A patient with                            mild systemic disease. After reviewing the risks                            and benefits, the patient was deemed in  satisfactory condition to undergo the procedure.                           After obtaining informed consent, the colonoscope                            was passed under direct vision. Throughout the                            procedure, the patient's blood pressure, pulse, and                            oxygen saturations were monitored continuously. The                            PCF-HQ190L Colonoscope was  introduced through the                            anus and advanced to the the cecum, identified by                            appendiceal orifice and ileocecal valve. The                            colonoscopy was performed without difficulty. The                            patient tolerated the procedure well. The quality                            of the bowel preparation was good. The ileocecal                            valve, appendiceal orifice, and rectum were                            photographed. The bowel preparation used was                            Miralax via split dose instruction. Scope In: 9:26:20 AM Scope Out: 9:39:06 AM Scope Withdrawal Time: 0 hours 10 minutes 28 seconds  Total Procedure Duration: 0 hours 12 minutes 46 seconds  Findings:                 The perianal and digital rectal examinations were                            normal. Pertinent negatives include normal prostate                            (size, shape, and consistency).                           A 4 mm polyp was found in the descending colon. The  polyp was sessile. The polyp was removed with a                            cold snare. Resection and retrieval were complete.                            Verification of patient identification for the                            specimen was done. Estimated blood loss was minimal.                           Multiple diverticula were found in the sigmoid                            colon.                           External hemorrhoids were found.                           The exam was otherwise without abnormality on                            direct and retroflexion views. Complications:            No immediate complications. Estimated Blood Loss:     Estimated blood loss was minimal. Impression:               - One 4 mm polyp in the descending colon, removed                            with a cold snare. Resected and retrieved.                            - Diverticulosis in the sigmoid colon and in the                            descending colon.                           - External hemorrhoids.                           - The examination was otherwise normal on direct                            and retroflexion views.                           - Personal history of colonic polyp diminutive                            adenoma 2015. Recommendation:           - Patient has a contact number available for  emergencies. The signs and symptoms of potential                            delayed complications were discussed with the                            patient. Return to normal activities tomorrow.                            Written discharge instructions were provided to the                            patient.                           - Resume previous diet.                           - Continue present medications.                           - Await pathology results.                           - Repeat colonoscopy is recommended for                            surveillance. The colonoscopy date will be                            determined after pathology results from today's                            exam become available for review. Gatha Mayer, MD 07/26/2021 9:49:51 AM This report has been signed electronically.

## 2021-07-28 ENCOUNTER — Telehealth: Payer: Self-pay | Admitting: *Deleted

## 2021-07-28 NOTE — Telephone Encounter (Signed)
  Follow up Call-  Call back number 07/26/2021  Post procedure Call Back phone  # [phone]  Permission to leave phone message Yes  Some recent data might be hidden     Patient questions:  Do you have a fever, pain , or abdominal swelling? No. Pain Score  0 *  Have you tolerated food without any problems? Yes.    Have you been able to return to your normal activities? Yes.    Do you have any questions about your discharge instructions: Diet   No. Medications  No. Follow up visit  No.  Do you have questions or concerns about your Care? No.  Actions: * If pain score is 4 or above: No action needed, pain <4.  Have you developed a fever since your procedure? no  2.   Have you had an respiratory symptoms (SOB or cough) since your procedure? no  3.   Have you tested positive for COVID 19 since your procedure no  4.   Have you had any family members/close contacts diagnosed with the COVID 19 since your procedure?  no   If yes to any of these questions please route to Joylene John, RN and Joella Prince, RN

## 2021-08-01 ENCOUNTER — Encounter: Payer: Self-pay | Admitting: Internal Medicine

## 2021-08-19 ENCOUNTER — Other Ambulatory Visit: Payer: Self-pay | Admitting: Family Medicine

## 2021-08-19 DIAGNOSIS — I10 Essential (primary) hypertension: Secondary | ICD-10-CM

## 2021-08-30 ENCOUNTER — Other Ambulatory Visit: Payer: Self-pay

## 2021-08-30 ENCOUNTER — Telehealth: Payer: BC Managed Care – PPO | Admitting: Family Medicine

## 2021-08-30 VITALS — Temp 97.0°F | Wt 235.0 lb

## 2021-08-30 DIAGNOSIS — J019 Acute sinusitis, unspecified: Secondary | ICD-10-CM

## 2021-08-30 MED ORDER — AMOXICILLIN 875 MG PO TABS: 875.0000 mg | ORAL_TABLET | Freq: Two times a day (BID) | ORAL | 0 refills | Status: DC

## 2021-08-30 NOTE — Progress Notes (Signed)
° °  Subjective:    Patient ID: Chris Skinner, male    DOB: 06/24/1966, 55 y.o.   MRN: [id number]  HPI Documentation for virtual audio and video telecommunications through [place] encounter:  The patient was located at home. 2 patient identifiers used.  The provider was located in the office. The patient did consent to this visit and is aware of possible charges through their insurance for this visit. The other persons participating in this telemedicine service were none. Time spent on call was 5 minutes and in review of previous records >18 minutes total for counseling and coordination of care. This virtual service is not related to other E/M service within previous 7 days.  He complains of a 1 week history of rhinorrhea, nasal congestion and slight cough.  In the last couple of days he is noted to drainage has become purulent in nature.  No sore throat, fever, chills, earache, upper tooth discomfort.  He has no history of allergies.  Review of Systems     Objective:   Physical Exam Alert and in no distress.  Voice sounds slightly hoarse.       Assessment & Plan:  Acute sinusitis, recurrence not specified, unspecified location - Plan: amoxicillin (AMOXIL) 875 MG tablet He will call if not entirely better when he finishes the antibiotic.

## 2021-11-16 ENCOUNTER — Other Ambulatory Visit: Payer: Self-pay

## 2021-11-16 ENCOUNTER — Encounter: Payer: Self-pay | Admitting: Medical

## 2021-11-16 ENCOUNTER — Ambulatory Visit: Payer: BC Managed Care – PPO | Admitting: Medical

## 2021-11-16 ENCOUNTER — Encounter: Payer: Self-pay | Admitting: Internal Medicine

## 2021-11-16 VITALS — BP 128/80 | HR 74 | Wt 234.8 lb

## 2021-11-16 DIAGNOSIS — Z636 Dependent relative needing care at home: Secondary | ICD-10-CM

## 2021-11-16 DIAGNOSIS — F430 Acute stress reaction: Secondary | ICD-10-CM

## 2021-11-16 DIAGNOSIS — R4589 Other symptoms and signs involving emotional state: Secondary | ICD-10-CM

## 2021-11-16 MED ORDER — FLUOXETINE HCL 20 MG PO TABS: 20.0000 mg | ORAL_TABLET | Freq: Every day | ORAL | 2 refills | Status: DC

## 2021-11-16 NOTE — Progress Notes (Signed)
Subjective: ? Chris Skinner is a 56 y.o. male who presents for ?Chief Complaint  ?Patient presents with  ? mental health issues  ?  Mental health issues within the last year.  Having issues with employment and then taking care of parents and dad is terminally ill  ?   ?Here for "struggling mentally" right now. ? ?He notes that he moved back in with parents the last 3 years to help take care of family.  Not married.  Has 2 sons, 37yo and 66yo.  Teaches high school, physical education.   Has been a rough year with work due to work stress and personal stress.   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   ? ?His big stressors right now are his father and mother's health.  Mom has dementia.  Father was told a few week ago that he has terminal cancer, started off with prostate.  Father is 21. There is a caregiver that comes in daily M-F to help with parents.  His sister does a lot to help.  He and his sister are the caregivers for his parents. ? ?2 Friday's ago father fell getting out of the shower, went into cardiac arrest, but they were able to get him back. ? ?He feels pushed over the edge with everytheing going on. ? ?The last 6-12 months has felt depressed, most of the time wants to sleep. ? ?Hasn't started counseling, had been avoiding this; however now he thinks he needs to pursue this. ? ?No prior medication for mental health issues.  No prior mental health diagnoses. ? ?No SI/HI. ? ?No other aggravating or relieving factors.   ? ?No other c/o. ? ?The following portions of the patient's history were reviewed and updated as appropriate: allergies, current medications, past family history, past medical history, past social history, past surgical history and problem list. ? ?ROS ?Otherwise as in subjective above ? ?Objective: ?BP 128/80   Pulse 74   Wt 234 lb 12.8 oz (106.5 kg)   BMI 31.84 kg/m?  ? ?General appearance: alert, no distress, well developed, well nourished ?Psych:  Pleasant, good eye contact, answers questions appropriately ? ? ?Assessment: ?Encounter Diagnoses  ?Name Primary?  ? Depressed mood Yes  ? Caregiver burden   ? Acute stress reaction   ? ? ? ?Plan: ?We discussed his concerns ? ?He has a lot weighing on him currently with father's recent terminal cancer diagnosis, mother with dementia, father's recent fall and cardiac arrest, work stress, caregiver burden. ? ?I gave him a list of counselors we are also going to help facilitate him getting a counselor ASAP ? ?We also are going to help to get him information on hospice as this could be a great benefit to him and his family currently ? ?Begin medication below to help with mood.  Discussed risk and benefits and proper use of medication. ? ?I wrote him out of work for 2 weeks to give him time to process things and to come up with a plan.  He may need to take a longer leave of absence but this would require some assistance with the counselor to work on paperwork from work.  Advise he go ahead and look into FMLA paperwork. ? ?Chris Skinner was seen today for mental health issues. ? ?Diagnoses and all orders for this visit: ? ?Depressed mood ? ?Caregiver burden ? ?Acute stress reaction ? ?Other orders ?-     Cancel: POCT rapid  strep A ?-     FLUoxetine (PROZAC) 20 MG tablet; Take 1 tablet (20 mg total) by mouth daily. ? ? ? ?Spent > 45 minutes face to face with patient in discussion of symptoms, evaluation, plan and recommendations.   ? ?Follow up: 3-4 weeks ? ? ?

## 2021-11-16 NOTE — Patient Instructions (Signed)
RESOURCES in [place], [place] ? ?If you are experiencing a mental health crisis or an emergency, please call 911 or go to the nearest emergency department. ? ?[facility]   [phone] ?[facility]  [phone] ?[place] ? ?Suicide Hotline 1-800-Suicide 863 251 3039)  ?National Suicide Prevention Lifeline 1-800-273-TALK  978 072 5044) ? ?Domestic Violence, Rape/Crisis - [place] 814-852-5179 ? ?The [employer] Violence Hotline 1-800-799-SAFE (917)673-6220) ? ?To report Child or Elder Abuse, please call: ?[employer]  [phone] ?[facility] Department  [phone] ? ?Teen Crisis line [phone] or [phone] ?  ? ? ?Psychiatry and Counseling services ? ?[facility] ?[place], [place], [place] [address] ?([phone] ? ? ?[facility] ?Behavioral Health Crisis Line and Main phone number ?[phone] ? ?Behavioral [facility] Urgent Care  ?[phone] ? ?[facility] ?[phone] ? ?[facility] [place] ?[phone] ? ? ?[facility] ?[address] [place], [place] [address] ?([phone] ? ? ? ?Counseling Services (NON- psychiatrist offices) ? ?Dr. George Hugh, [place] ?([phone] ?[address] [place], [place] [address] ? ? ?[place] ?[address], [place], [place] [address] ?([phone] ? ? ?[facility] ?([phone] ?[address] [place], [place], [place] [address] ? ? ?[facility] ?[phone]  ?[email or web address] ?[address], [place], [place], [place] [address] ? ? ?[facility] ?(336) [phone] ?[address], [place], [place] [address] ? ? ?[facility] ?[phone] office ?[place] ?[address], [place], [place] [address] ?Crisis services, Family  support, in home therapy, treatment for Anxiety, PTSD, Sexual Assault, Substance Abuse, Financial/Credit Counseling, Variety of other services ?  ?

## 2021-11-19 ENCOUNTER — Other Ambulatory Visit: Payer: Self-pay | Admitting: Family Medicine

## 2021-11-19 DIAGNOSIS — I10 Essential (primary) hypertension: Secondary | ICD-10-CM

## 2021-11-24 ENCOUNTER — Institutional Professional Consult (permissible substitution): Payer: BC Managed Care – PPO | Admitting: Family Medicine

## 2021-12-01 ENCOUNTER — Ambulatory Visit: Payer: BC Managed Care – PPO | Admitting: Family Medicine

## 2021-12-01 ENCOUNTER — Encounter: Payer: Self-pay | Admitting: Family Medicine

## 2021-12-01 VITALS — BP 104/66 | HR 74 | Temp 97.2°F | Wt 223.2 lb

## 2021-12-01 DIAGNOSIS — R4589 Other symptoms and signs involving emotional state: Secondary | ICD-10-CM | POA: Diagnosis not present

## 2021-12-01 DIAGNOSIS — Z7189 Other specified counseling: Secondary | ICD-10-CM

## 2021-12-01 DIAGNOSIS — Z636 Dependent relative needing care at home: Secondary | ICD-10-CM | POA: Diagnosis not present

## 2021-12-01 MED ORDER — ALPRAZOLAM 0.25 MG PO TABS
0.2500 mg | ORAL_TABLET | Freq: Two times a day (BID) | ORAL | 0 refills | Status: DC | PRN
Start: 2021-12-01 — End: 2022-10-18

## 2021-12-01 MED ORDER — SERTRALINE HCL 50 MG PO TABS: 50.0000 mg | ORAL_TABLET | Freq: Every day | ORAL | 3 refills | Status: DC

## 2021-12-01 NOTE — Progress Notes (Signed)
? ?  Subjective:  ? ? Patient ID: Chris Skinner, male    DOB: [birth year], 56 y.o.   MRN: [id number] ? ?HPI ?He is here for a recheck.  He was seen recently and placed on Prozac.  He has been under a great deal of stress dealing with work and helping to take care of both his mother and father.  Recently his father died and mother was taken to the hospital and returned home.  She is requiring a lot of care.  They do have someone coming in now that her father has died to help with this.  He has a session set up for counseling to help deal with all this up.  He states that he does not think the Prozac has helped. ? ? ?Review of Systems ? ?   ?Objective:  ? Physical Exam ? ?Alert and in no distress with flat affect. ? ? ?   ?Assessment & Plan:  ?Depressed mood ? ?Caregiver burden ? ?Bereavement counseling ?Apparently his sister is on Zoloft which has been very useful for her.  I will place him on Zoloft, he is to stop taking the Prozac.  Encouraged him to continue in counseling.  He will keep me informed concerning home health and if we need to send an order to get them to come by to do an assessment with certainly well.  We will also fill out paperwork for him to stay out of work for the next month to help deal with the recent death and essentially full-time care for his mother. ? ?

## 2021-12-23 ENCOUNTER — Other Ambulatory Visit: Payer: Self-pay | Admitting: Family Medicine

## 2021-12-23 DIAGNOSIS — R4589 Other symptoms and signs involving emotional state: Secondary | ICD-10-CM

## 2021-12-23 NOTE — Telephone Encounter (Signed)
[facility] [address] is requesting [facility] day supply of  Sertraline ?

## 2021-12-28 ENCOUNTER — Encounter: Payer: Self-pay | Admitting: Family Medicine

## 2021-12-28 ENCOUNTER — Telehealth: Payer: BC Managed Care – PPO | Admitting: Family Medicine

## 2021-12-28 VITALS — BP 130/87 | Wt 218.0 lb

## 2021-12-28 DIAGNOSIS — R4589 Other symptoms and signs involving emotional state: Secondary | ICD-10-CM | POA: Diagnosis not present

## 2021-12-28 DIAGNOSIS — Z7189 Other specified counseling: Secondary | ICD-10-CM | POA: Diagnosis not present

## 2021-12-28 DIAGNOSIS — Z636 Dependent relative needing care at home: Secondary | ICD-10-CM | POA: Diagnosis not present

## 2021-12-28 MED ORDER — SERTRALINE HCL 100 MG PO TABS: 100.0000 mg | ORAL_TABLET | Freq: Every day | ORAL | 3 refills | Status: DC

## 2021-12-28 NOTE — Progress Notes (Signed)
? ?  Subjective:  ? ? Patient ID: Chris Skinner, male    DOB: 12-Dec-1965, 56 y.o.   MRN: [id number] ? ?HPI ?Documentation for virtual audio and video telecommunications through [place] encounter: ? ?The patient was located at home. 2 patient identifiers used.  ?The provider was located in the office. ?The patient did consent to this visit and is aware of possible charges through their insurance for this visit. ? ?The other persons participating in this telemedicine service were none. ?Time spent on call was 5 minutes and in review of previous records >18 minutes total for counseling and coordination of care. ? ?This virtual service is not related to other E/M service within previous 7 days.  ?He was started on Zoloft on his last encounter and states that he is roughly 50% better.  He thinks that he is not ready to go back to work.  He is helping to take care of his mother.  He has gotten involved in counseling and states that is really working. ? ?Review of Systems ? ?   ?Objective:  ? Physical Exam ?Alert and in no distress but have a  flat affect. ? ? ? ?   ?Assessment & Plan:  ?Depressed mood ? ?Caregiver burden ? ?Bereavement counseling ?I will increase his Zoloft to 100 mg.  Give him a note for out of work for another month.  And set up another virtual appointment in 1 month. ? ?

## 2022-01-09 ENCOUNTER — Other Ambulatory Visit: Payer: Self-pay | Admitting: Cardiovascular Disease

## 2022-01-11 ENCOUNTER — Telehealth: Payer: Self-pay | Admitting: Family Medicine

## 2022-01-11 NOTE — Telephone Encounter (Signed)
Form were placed on your desk please advise when to call pt for pickup. kh ?

## 2022-01-11 NOTE — Telephone Encounter (Signed)
Pt came in and dropped off FMLA paper work to be completed. Pt states he needs by Friday. Please call pt at [phone] when ready. Taking back to Maudie Mercury so she can expedite completion.   ?

## 2022-01-20 ENCOUNTER — Other Ambulatory Visit: Payer: Self-pay | Admitting: Family Medicine

## 2022-01-20 NOTE — Telephone Encounter (Signed)
Pharmacy is requesting a 90 day supply of Sertraline ?

## 2022-01-23 ENCOUNTER — Ambulatory Visit: Payer: BC Managed Care – PPO | Admitting: Family Medicine

## 2022-01-23 VITALS — BP 110/72 | HR 73 | Temp 98.1°F | Wt 223.8 lb

## 2022-01-23 DIAGNOSIS — Z7189 Other specified counseling: Secondary | ICD-10-CM | POA: Diagnosis not present

## 2022-01-23 DIAGNOSIS — Z636 Dependent relative needing care at home: Secondary | ICD-10-CM

## 2022-01-23 DIAGNOSIS — R4589 Other symptoms and signs involving emotional state: Secondary | ICD-10-CM | POA: Diagnosis not present

## 2022-01-23 NOTE — Progress Notes (Signed)
? ?  Subjective:  ? ? Patient ID: Chris Skinner, male    DOB: [birth year], 56 y.o.   MRN: [id number] ? ?HPI ?He is here for recheck.  He states that he is now 90% better.  He has 3 weeks of schooling left.  He is still his mother's primary caregiver.  He is with her three quarters of the day and does have help during the day like hours to bathe and dress her.  They are in the process of getting all the legal paperwork which should be finished within the next month or so. ?He is still processing the fact that his father recently died ? ?Review of Systems ? ?   ?Objective:  ? Physical Exam ?Alert and in no distress with appropriate affect ? ? ? ?   ?Assessment & Plan:  ?Depressed mood ? ?Caregiver burden ? ?Bereavement counseling ?I will fill out paperwork for another month on FMLA since he is a primary caregiver. ? ?

## 2022-02-07 ENCOUNTER — Other Ambulatory Visit: Payer: Self-pay | Admitting: Medical

## 2022-02-07 NOTE — Telephone Encounter (Signed)
Was discontinueds by Dr. Redmond School on 12/01/21 due to change in therapy

## 2022-02-20 ENCOUNTER — Ambulatory Visit (HOSPITAL_COMMUNITY): Payer: BC Managed Care – PPO | Attending: Cardiovascular Disease

## 2022-02-20 DIAGNOSIS — I350 Nonrheumatic aortic (valve) stenosis: Secondary | ICD-10-CM | POA: Insufficient documentation

## 2022-02-20 LAB — ECHOCARDIOGRAM COMPLETE
AR max vel: 0.89 cm2
AV Area VTI: 0.92 cm2
AV Area mean vel: 0.88 cm2
AV Mean grad: 36 mmHg
AV Peak grad: 63 mmHg
Ao pk vel: 3.97 m/s
Area-P 1/2: 2.72 cm2
P 1/2 time: 407 msec
S' Lateral: 2.9 cm

## 2022-02-23 ENCOUNTER — Other Ambulatory Visit: Payer: Self-pay | Admitting: Family Medicine

## 2022-02-23 DIAGNOSIS — I10 Essential (primary) hypertension: Secondary | ICD-10-CM

## 2022-04-08 ENCOUNTER — Other Ambulatory Visit: Payer: Self-pay | Admitting: Family Medicine

## 2022-04-08 DIAGNOSIS — I10 Essential (primary) hypertension: Secondary | ICD-10-CM

## 2022-05-17 ENCOUNTER — Encounter: Payer: Self-pay | Admitting: Family Medicine

## 2022-05-17 ENCOUNTER — Ambulatory Visit: Payer: BC Managed Care – PPO | Admitting: Family Medicine

## 2022-05-17 VITALS — BP 104/66 | HR 54 | Temp 97.3°F | Ht 72.0 in | Wt 227.6 lb

## 2022-05-17 DIAGNOSIS — G4730 Sleep apnea, unspecified: Secondary | ICD-10-CM

## 2022-05-17 DIAGNOSIS — Z636 Dependent relative needing care at home: Secondary | ICD-10-CM

## 2022-05-17 DIAGNOSIS — E785 Hyperlipidemia, unspecified: Secondary | ICD-10-CM | POA: Diagnosis not present

## 2022-05-17 DIAGNOSIS — Z8042 Family history of malignant neoplasm of prostate: Secondary | ICD-10-CM

## 2022-05-17 DIAGNOSIS — Z860100 Personal history of colon polyps, unspecified: Secondary | ICD-10-CM

## 2022-05-17 DIAGNOSIS — R7309 Other abnormal glucose: Secondary | ICD-10-CM | POA: Diagnosis not present

## 2022-05-17 DIAGNOSIS — Z0000 Encounter for general adult medical examination without abnormal findings: Secondary | ICD-10-CM

## 2022-05-17 DIAGNOSIS — I10 Essential (primary) hypertension: Secondary | ICD-10-CM | POA: Diagnosis not present

## 2022-05-17 DIAGNOSIS — F17200 Nicotine dependence, unspecified, uncomplicated: Secondary | ICD-10-CM

## 2022-05-17 DIAGNOSIS — E7439 Other disorders of intestinal carbohydrate absorption: Secondary | ICD-10-CM

## 2022-05-17 DIAGNOSIS — N529 Male erectile dysfunction, unspecified: Secondary | ICD-10-CM

## 2022-05-17 DIAGNOSIS — Z23 Encounter for immunization: Secondary | ICD-10-CM

## 2022-05-17 DIAGNOSIS — Z125 Encounter for screening for malignant neoplasm of prostate: Secondary | ICD-10-CM

## 2022-05-17 DIAGNOSIS — I350 Nonrheumatic aortic (valve) stenosis: Secondary | ICD-10-CM

## 2022-05-17 DIAGNOSIS — Z833 Family history of diabetes mellitus: Secondary | ICD-10-CM

## 2022-05-17 DIAGNOSIS — E66811 Obesity, class 1: Secondary | ICD-10-CM

## 2022-05-17 DIAGNOSIS — E669 Obesity, unspecified: Secondary | ICD-10-CM

## 2022-05-17 LAB — POCT GLYCOSYLATED HEMOGLOBIN (HGB A1C): Hemoglobin A1C: 5.8 % — AB (ref 4.0–5.6)

## 2022-05-17 MED ORDER — AMLODIPINE BESYLATE 5 MG PO TABS: 5.0000 mg | ORAL_TABLET | Freq: Every day | ORAL | 3 refills | Status: DC

## 2022-05-17 MED ORDER — LISINOPRIL-HYDROCHLOROTHIAZIDE 20-12.5 MG PO TABS: 1.0000 | ORAL_TABLET | Freq: Every day | ORAL | 3 refills | Status: DC

## 2022-05-17 MED ORDER — ROSUVASTATIN CALCIUM 40 MG PO TABS: 40.0000 mg | ORAL_TABLET | Freq: Every day | ORAL | 3 refills | Status: DC

## 2022-05-17 MED ORDER — EZETIMIBE 10 MG PO TABS: 10.0000 mg | ORAL_TABLET | Freq: Every day | ORAL | 3 refills | Status: DC

## 2022-05-17 NOTE — Progress Notes (Signed)
Complete physical exam  Patient: Chris Skinner   DOB: [birth year]   56 y.o. Male  MRN: [id number]  Subjective:    Chief Complaint  Patient presents with   Annual Exam    Fasting     Chris Skinner is a 56 y.o. male who presents today for a complete physical exam. He reports consuming a general diet. Home exercise routine includes walking 6 hrs per week. He generally feels well. He reports sleeping well. Marland Kitchen  He does have a history of aortic stenosis and did have a recent echo done.  He has not heard back from cardiology.  I did go over the results stating it looks like moderate to severe stenosis.  Presently he is having no chest pain, shortness of breath, DOE or syncopal episodes.  He has had some issues with memory recently.  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  He stopped taking his Zoloft 2 months ago and psychologically seems to be doing quite well.  He does have OSA and uses CPAP regularly.  He states that he gets good readouts from this.  He continues on amlodipine and lisinopril/HCTZ.  No difficulty with that.  He is also taking Zetia and Crestor.  Does use Cialis on an as-needed basis for ED.  He recently had a colonoscopy which showed colonic polyps.  He is scheduled for routine follow-up on that.  He does smoke but can go 2 or 3 days without a cigarette.  Does have a previous history of glucose intolerance.  He also has a previous history of prostate cancer in his family as well as diabetes.  Otherwise his family and social history as well as health maintenance and immunizations was reviewed   Most recent fall risk assessment:    11/16/2021   10:14 AM  Fall Risk   Falls in the past year? 0  Number falls in past yr: 0  Injury with Fall? 0  Risk for fall due to : No Fall Risks  Follow up Falls evaluation completed     Most recent depression screenings:    05/17/2022    8:28 AM 12/01/2021    8:04 AM  PHQ 2/9 Scores  PHQ - 2  Score 0 6  PHQ- 9 Score  21      Patient Active Problem List   Diagnosis Date Noted   History of COVID-19 05/09/2021   Smoker 04/26/2020   Erectile dysfunction 04/26/2020   Obesity (BMI 30.0-34.9) 04/26/2020   Glucose intolerance 04/26/2020   Family history of prostate cancer in father 04/26/2020   Family history of diabetes mellitus 04/26/2020   Moderate aortic stenosis 04/23/2019   Sleep apnea 04/23/2019   History of colonic polyps 01/05/2014   Dyslipidemia, goal LDL below 100 04/16/2012   Essential hypertension 04/16/2012   Past Medical History:  Diagnosis Date   Chest pain 2001   [place], normal cardiac workup   Dyslipidemia    H/O echocardiogram 09/2013   PFO found incidentally.  Echo done due to murmur   Heart murmur    mild aortic stenosis   Hyperlipidemia    Hypertension    Personal history of colonic polyp - adenoma 01/05/2014   Sleep apnea    cpap   Tobacco use disorder    Wears glasses    Past Surgical History:  Procedure Laterality Date   BIOPSY  01/17/2021   Procedure: BIOPSY;  Surgeon: Meryl Dare, MD;  Location:  [facility];  Service: Endoscopy;;   COLONOSCOPY  2015   ESOPHAGOGASTRODUODENOSCOPY (EGD) WITH PROPOFOL N/A 01/17/2021   Procedure: ESOPHAGOGASTRODUODENOSCOPY (EGD) WITH PROPOFOL;  Surgeon: Ladene Artist, MD;  Location[facility] END[facility]COPY;  Service: Endoscopy;  Laterality: N/A;   FOREIGN BODY REMOVAL  01/17/2021   Procedure: FOREIGN BODY REMOVAL;  Surgeon: Ladene Artist, MD;  Location: [facility] ENDOSCOPY;  Service: Endoscopy;;   KNEE ARTHROSCOPY [place]   Social History   Tobacco Use   Smoking status: Every Day   Smokeless tobacco: Former    Types: Chew    Quit date: 2009  Vaping Use   Vaping Use: Former  Substance Use Topics   Alcohol use: Yes    Alcohol/week: 10.0 standard drinks of alcohol    Types: 10 Cans of beer per week   Drug use: No   Family History  Problem Relation Age of Onset    Hyperlipidemia Mother    Heart disease Mother    Arthritis Mother    Stroke Mother    Heart disease Father    Diabetes Father        borderline   Crohn's disease Sister    Diabetes Paternal Grandfather    Cancer Neg Hx    Colon cancer Neg Hx    Colon polyps Neg Hx    Esophageal cancer Neg Hx    Stomach cancer Neg Hx    Rectal cancer Neg Hx    No Known Allergies    Patient Care Team: Denita Lung, MD as PCP - General (Family Medicine) Troy Sine, MD as PCP - Cardiology (Cardiology)   Outpatient Medications Prior to Visit  Medication Sig Note   Cetirizine HCl (ZYRTEC PO) Take by mouth daily.    Naproxen Sodium (ALEVE PO) Take by mouth daily. 05/17/2022: Prn last dose yesterday   Omega-3 Fatty Acids (FISH OIL OMEGA-3 PO) Take by mouth daily.    [DISCONTINUED] amLODipine (NORVASC) 5 MG tablet TAKE 1 TABLET BY MOUTH EVERY DAY    [DISCONTINUED] ezetimibe (ZETIA) 10 MG tablet TAKE 1 TABLET BY MOUTH EVERY DAY    [DISCONTINUED] lisinopril-hydrochlorothiazide (ZESTORETIC) 20-12.5 MG tablet TAKE 1 TABLET BY MOUTH EVERY DAY    [DISCONTINUED] rosuvastatin (CRESTOR) 40 MG tablet TAKE 1 TABLET BY MOUTH EVERY DAY    ALPRAZolam (XANAX) 0.25 MG tablet Take 1 tablet (0.25 mg total) by mouth 2 (two) times daily as needed for anxiety. (Patient not taking: Reported on 12/28/2021)    chlorhexidine (PERIDEX) 0.12 % solution SMARTSIG:0.5 Ounce(s) By Mouth Twice Daily (Patient not taking: Reported on 05/17/2022)    sertraline (ZOLOFT) 100 MG tablet TAKE 1 TABLET BY MOUTH EVERY DAY (Patient not taking: Reported on 05/17/2022) 05/17/2022: Last dose two months ago   tadalafil (CIALIS) 20 MG tablet Take 1 tablet (20 mg total) by mouth daily as needed for erectile dysfunction. (Patient not taking: Reported on 05/17/2022) 05/17/2022: Prn last dose six months ago   No facility-administered medications prior to visit.    Review of Systems  All other systems reviewed and are negative.         Objective:      BP 104/66   Pulse (!) 54   Temp (!) 97.3 F (36.3 C)   Ht 6' (1.829 m)   Wt 227 lb 9.6 oz (103.2 kg)   SpO2 94%   BMI 30.87 kg/m  BP Readings from Last 3 Encounters:  05/17/22 104/66  01/23/22 110/72  12/28/21 130/87  Wt Readings from Last 3 Encounters:  05/17/22 227 lb 9.6 oz (103.2 kg)  01/23/22 223 lb 12.8 oz (101.5 kg)  12/28/21 218 lb (98.9 kg)      Physical Exam  Alert and in no distress. Tympanic membranes and canals are normal. Pharyngeal area is normal. Neck is supple without adenopathy or thyromegaly. Cardiac exam shows a regular sinus rhythm without murmurs or gallops. Lungs are clear to auscultation.  Abdominal exam shows no masses or tenderness with normal bowel sounds,  Results for orders placed or performed in visit on 05/17/22  POCT glycosylated hemoglobin (Hb A1C)  Result Value Ref Range   Hemoglobin A1C 5.8 (A) 4.0 - 5.6 %   HbA1c POC (<> result, manual entry)     HbA1c, POC (prediabetic range)     HbA1c, POC (controlled diabetic range)     Last CBC Lab Results  Component Value Date   WBC 8.6 02/09/2021   HGB 15.2 02/09/2021   HCT 44.3 02/09/2021   MCV 94 02/09/2021   MCH 32.2 02/09/2021   RDW 11.7 02/09/2021   PLT 300 07/37/1062   Last metabolic panel Lab Results  Component Value Date   GLUCOSE 119 (H) 02/09/2021   NA 141 02/09/2021   K 4.7 02/09/2021   CL 104 02/09/2021   CO2 21 02/09/2021   BUN 18 02/09/2021   CREATININE 0.92 02/09/2021   EGFR 97 02/09/2021   CALCIUM 9.4 02/09/2021   PROT 6.8 02/09/2021   ALBUMIN 4.7 02/09/2021   LABGLOB 2.1 02/09/2021   AGRATIO 2.2 02/09/2021   BILITOT 0.7 02/09/2021   ALKPHOS 55 02/09/2021   AST 27 02/09/2021   ALT 37 02/09/2021   ANIONGAP 9 01/17/2021   Last lipids Lab Results  Component Value Date   CHOL 167 02/09/2021   HDL 45 02/09/2021   LDLCALC 102 (H) 02/09/2021   TRIG 110 02/09/2021   CHOLHDL 3.7 02/09/2021   Last hemoglobin A1c Lab Results  Component Value Date   HGBA1C  5.8 (A) 05/17/2022        Assessment & Plan:     Immunization History  Administered Date(s) Administered   Hepatitis B 04/16/2012   Hepatitis B, adult 09/09/2013   Influenza,inj,Quad PF,6+ Mos 06/18/2017, 05/17/2022   MMR 03/10/2011, 09/09/2013   Moderna Sars-Covid-2 Vaccination 10/10/2019, 11/07/2019, 06/29/2020   PPD Test 02/28/2011, 03/23/2017   Tdap 04/03/2008, 04/26/2020   Zoster Recombinat (Shingrix) 05/17/2022    Health Maintenance  Topic Date Due   COVID-19 Vaccine (4 - Moderna risk series) 08/24/2020   HIV Screening  01/24/2023 (Originally 09/22/1980)   Zoster Vaccines- Shingrix (2 of 2) 07/12/2022   COLONOSCOPY (Pts 45-11yrs Insurance coverage will need to be confirmed)  07/26/2028   TETANUS/TDAP  04/26/2030   INFLUENZA VACCINE  Completed   Hepatitis C Screening  Completed   HPV VACCINES  Aged Out    Discussed health benefits of physical activity, and encouraged him to engage in regular exercise appropriate for his age and condition.  Problem List Items Addressed This Visit     Dyslipidemia, goal LDL below 100 (Chronic)   Relevant Medications   lisinopril-hydrochlorothiazide (ZESTORETIC) 20-12.5 MG tablet   amLODipine (NORVASC) 5 MG tablet   rosuvastatin (CRESTOR) 40 MG tablet   ezetimibe (ZETIA) 10 MG tablet   Other Relevant Orders   Lipid panel   Moderate aortic stenosis (Chronic)   Relevant Medications   lisinopril-hydrochlorothiazide (ZESTORETIC) 20-12.5 MG tablet   amLODipine (NORVASC) 5 MG tablet   rosuvastatin (CRESTOR) 40 MG  tablet   ezetimibe (ZETIA) 10 MG tablet   Sleep apnea (Chronic)   Erectile dysfunction   Essential hypertension   Relevant Medications   lisinopril-hydrochlorothiazide (ZESTORETIC) 20-12.5 MG tablet   amLODipine (NORVASC) 5 MG tablet   rosuvastatin (CRESTOR) 40 MG tablet   ezetimibe (ZETIA) 10 MG tablet   Other Relevant Orders   CBC with Differential/Platelet   Comprehensive metabolic panel   Family history of diabetes  mellitus   Family history of prostate cancer in father   Glucose intolerance   Relevant Orders   POCT glycosylated hemoglobin (Hb A1C) (Completed)   History of colonic polyps   Obesity (BMI 30.0-34.9)   Smoker   Other Visit Diagnoses     Routine general medical examination at a health care facility    -  Primary   Relevant Orders   CBC with Differential/Platelet   Comprehensive metabolic panel   Lipid panel   Caregiver burden       Screening for prostate cancer       Relevant Orders   PSA   Need for shingles vaccine       Relevant Orders   Varicella-zoster vaccine IM (Completed)   Need for influenza vaccination       Relevant Orders   Flu Vaccine QUAD 6+ mos PF IM (Fluarix Quad PF) (Completed)     His cognitive function is okay.  I again discussed the fact that his A1c is in prediabetes stage and encouraged diet and exercise.  Also discussed smoking cessation with him indicating this is more of a psychological issue. Return in about 1 year (around 05/18/2023) for for cpe and 2 -6 months later for 2nd shingle shot.     [name], MD

## 2022-05-17 NOTE — Patient Instructions (Signed)
Health Maintenance, Male Adopting a healthy lifestyle and getting preventive care are important in promoting health and wellness. Ask your health care provider about: The right schedule for you to have regular tests and exams. Things you can do on your own to prevent diseases and keep yourself healthy. What should I know about diet, weight, and exercise? Eat a healthy diet  Eat a diet that includes plenty of vegetables, fruits, low-fat dairy products, and lean protein. Do not eat a lot of foods that are high in solid fats, added sugars, or sodium. Maintain a healthy weight Body mass index (BMI) is a measurement that can be used to identify possible weight problems. It estimates body fat based on height and weight. Your health care provider can help determine your BMI and help you achieve or maintain a healthy weight. Get regular exercise Get regular exercise. This is one of the most important things you can do for your health. Most adults should: Exercise for at least 150 minutes each week. The exercise should increase your heart rate and make you sweat (moderate-intensity exercise). Do strengthening exercises at least twice a week. This is in addition to the moderate-intensity exercise. Spend less time sitting. Even light physical activity can be beneficial. Watch cholesterol and blood lipids Have your blood tested for lipids and cholesterol at 58 years of age, then have this test every 5 years. You may need to have your cholesterol levels checked more often if: Your lipid or cholesterol levels are high. You are older than 58 years of age. You are at high risk for heart disease. What should I know about cancer screening? Many types of cancers can be detected early and may often be prevented. Depending on your health history and family history, you may need to have cancer screening at various ages. This may include screening for: Colorectal cancer. Prostate cancer. Skin cancer. Lung  cancer. What should I know about heart disease, diabetes, and high blood pressure? Blood pressure and heart disease High blood pressure causes heart disease and increases the risk of stroke. This is more likely to develop in people who have high blood pressure readings or are overweight. Talk with your health care provider about your target blood pressure readings. Have your blood pressure checked: Every 3-5 years if you are 18-39 years of age. Every year if you are 40 years old or older. If you are between the ages of 65 and 75 and are a current or former smoker, ask your health care provider if you should have a one-time screening for abdominal aortic aneurysm (AAA). Diabetes Have regular diabetes screenings. This checks your fasting blood sugar level. Have the screening done: Once every three years after age 45 if you are at a normal weight and have a low risk for diabetes. More often and at a younger age if you are overweight or have a high risk for diabetes. What should I know about preventing infection? Hepatitis B If you have a higher risk for hepatitis B, you should be screened for this virus. Talk with your health care provider to find out if you are at risk for hepatitis B infection. Hepatitis C Blood testing is recommended for: Everyone born from 1945 through 1965. Anyone with known risk factors for hepatitis C. Sexually transmitted infections (STIs) You should be screened each year for STIs, including gonorrhea and chlamydia, if: You are sexually active and are younger than 58 years of age. You are older than 58 years of age and your   health care provider tells you that you are at risk for this type of infection. Your sexual activity has changed since you were last screened, and you are at increased risk for chlamydia or gonorrhea. Ask your health care provider if you are at risk. Ask your health care provider about whether you are at high risk for HIV. Your health care provider  may recommend a prescription medicine to help prevent HIV infection. If you choose to take medicine to prevent HIV, you should first get tested for HIV. You should then be tested every 3 months for as long as you are taking the medicine. Follow these instructions at home: Alcohol use Do not drink alcohol if your health care provider tells you not to drink. If you drink alcohol: Limit how much you have to 0-2 drinks a day. Know how much alcohol is in your drink. In the [place] one drink equals one 12 oz bottle of beer (355 mL), one 5 oz glass of wine (148 mL), or one 1 oz glass of hard liquor (44 mL). Lifestyle Do not use any products that contain nicotine or tobacco. These products include cigarettes, chewing tobacco, and vaping devices, such as e-cigarettes. If you need help quitting, ask your health care provider. Do not use street drugs. Do not share needles. Ask your health care provider for help if you need support or information about quitting drugs. General instructions Schedule regular health, dental, and eye exams. Stay current with your vaccines. Tell your health care provider if: You often feel depressed. You have ever been abused or do not feel safe at home. Summary Adopting a healthy lifestyle and getting preventive care are important in promoting health and wellness. Follow your health care provider's instructions about healthy diet, exercising, and getting tested or screened for diseases. Follow your health care provider's instructions on monitoring your cholesterol and blood pressure. This information is not intended to replace advice given to you by your health care provider. Make sure you discuss any questions you have with your health care provider. Document Revised: 01/17/2021 Document Reviewed: 01/17/2021 [employer] Patient Education  2023 [employer]  

## 2022-05-18 LAB — COMPREHENSIVE METABOLIC PANEL
ALT: 35 IU/L (ref 0–44)
AST: 30 IU/L (ref 0–40)
Albumin/Globulin Ratio: 2.2 (ref 1.2–2.2)
Albumin: 4.6 g/dL (ref 3.8–4.9)
Alkaline Phosphatase: 53 IU/L (ref 44–121)
BUN/Creatinine Ratio: 20 (ref 9–20)
BUN: 16 mg/dL (ref 6–24)
Bilirubin Total: 0.6 mg/dL (ref 0.0–1.2)
CO2: 22 mmol/L (ref 20–29)
Calcium: 9.7 mg/dL (ref 8.7–10.2)
Chloride: 101 mmol/L (ref 96–106)
Creatinine, Ser: 0.8 mg/dL (ref 0.76–1.27)
Globulin, Total: 2.1 g/dL (ref 1.5–4.5)
Glucose: 115 mg/dL — ABNORMAL HIGH (ref 70–99)
Potassium: 4.3 mmol/L (ref 3.5–5.2)
Sodium: 139 mmol/L (ref 134–144)
Total Protein: 6.7 g/dL (ref 6.0–8.5)
eGFR: 103 mL/min/{1.73_m2} (ref >59)

## 2022-05-18 LAB — CBC WITH DIFFERENTIAL/PLATELET
Basophils Absolute: 0.1 10*3/uL (ref 0.0–0.2)
Basos: 1 %
EOS (ABSOLUTE): 0.6 10*3/uL — ABNORMAL HIGH (ref 0.0–0.4)
Eos: 7 %
Hematocrit: 46.5 % (ref 37.5–51.0)
Hemoglobin: 15.8 g/dL (ref 13.0–17.7)
Immature Grans (Abs): 0 10*3/uL (ref 0.0–0.1)
Immature Granulocytes: 0 %
Lymphocytes Absolute: 2.3 10*3/uL (ref 0.7–3.1)
Lymphs: 26 %
MCH: 32.3 pg (ref 26.6–33.0)
MCHC: 34 g/dL (ref 31.5–35.7)
MCV: 95 fL (ref 79–97)
Monocytes Absolute: 0.6 10*3/uL (ref 0.1–0.9)
Monocytes: 6 %
Neutrophils Absolute: 5.3 10*3/uL (ref 1.4–7.0)
Neutrophils: 60 %
Platelets: 278 10*3/uL (ref 150–450)
RBC: 4.89 x10E6/uL (ref 4.14–5.80)
RDW: 12.1 % (ref 11.6–15.4)
WBC: 8.8 10*3/uL (ref 3.4–10.8)

## 2022-05-18 LAB — LIPID PANEL
Chol/HDL Ratio: 3.3 ratio (ref 0.0–5.0)
Cholesterol, Total: 166 mg/dL (ref 100–199)
HDL: 50 mg/dL (ref >39)
LDL Chol Calc (NIH): 98 mg/dL (ref 0–99)
Triglycerides: 101 mg/dL (ref 0–149)
VLDL Cholesterol Cal: 18 mg/dL (ref 5–40)

## 2022-05-18 LAB — PSA: Prostate Specific Ag, Serum: 0.8 ng/mL (ref 0.0–4.0)

## 2022-06-20 ENCOUNTER — Encounter: Payer: Self-pay | Admitting: Internal Medicine

## 2022-07-03 ENCOUNTER — Encounter: Payer: Self-pay | Admitting: Internal Medicine

## 2022-07-20 ENCOUNTER — Other Ambulatory Visit (INDEPENDENT_AMBULATORY_CARE_PROVIDER_SITE_OTHER): Payer: BC Managed Care – PPO

## 2022-07-20 DIAGNOSIS — Z23 Encounter for immunization: Secondary | ICD-10-CM | POA: Diagnosis not present

## 2022-08-25 ENCOUNTER — Ambulatory Visit: Payer: BC Managed Care – PPO | Admitting: Cardiovascular Disease

## 2022-10-18 ENCOUNTER — Encounter: Payer: Self-pay | Admitting: Cardiovascular Disease

## 2022-10-18 ENCOUNTER — Ambulatory Visit: Payer: BC Managed Care – PPO | Attending: Cardiovascular Disease | Admitting: Cardiovascular Disease

## 2022-10-18 VITALS — BP 116/80 | HR 62 | Ht 73.0 in | Wt 234.0 lb

## 2022-10-18 DIAGNOSIS — I350 Nonrheumatic aortic (valve) stenosis: Secondary | ICD-10-CM | POA: Diagnosis not present

## 2022-10-18 DIAGNOSIS — Z720 Tobacco use: Secondary | ICD-10-CM

## 2022-10-18 DIAGNOSIS — E785 Hyperlipidemia, unspecified: Secondary | ICD-10-CM | POA: Diagnosis not present

## 2022-10-18 DIAGNOSIS — I10 Essential (primary) hypertension: Secondary | ICD-10-CM | POA: Diagnosis not present

## 2022-10-18 NOTE — Progress Notes (Signed)
Cardiology Office Note    Date:  10/22/2022   ID:  Chris Skinner, DOB [birth year], MRN [id number]  PCP:  Denita Lung, MD  Cardiologist:  Shelva Majestic, MD    19 month F/U cardiology consultation, initially referred through the courtesy of Dr. [name]  History of Present Illness:  Chris Skinner is a 57 y.o. male who was referred for evaluation of aortic stenosis through the courtesy of Dr. [name].  I last saw him in July 2022.   He presents for follow-up evaluation  Chris Skinner has a history of hypertension, hyperlipidemia, and obstructive sleep apnea.  He has a history of tobacco use and currently smokes less than a pack per day but has been smoking for over 35 years.  He had quit smoking for year and a half, but then unfortunately resumed.  He is a Psychiatrist.  He was recently evaluated by Dr. Redmond School and was referred for follow-up echo Doppler study due to.  It is cardiac murmur.  Of note, in January 2015.  He had undergone an echo Doppler study which showed normal systolic function with an EF of 60-65%.  There was a suggestion of a small patent foramen ovale.  He had a mean aortic gradient of 10 and a peak gradient of 18 mm, suggesting very mild aortic stenosis.  At that time.  He underwent a repeat echo Doppler study on 06/21/2017, which continue to show normal LV function with an EF of 55-60%.  However, the aortic valve.  Functionally bicuspid and was calcified with mobility restriction.  He was felt to have mild to moderate aortic stenosis with a mean gradient of 22 and his peak gradient had increased to 46 mm.  There was no mention of a PFO.  There was mild TR.  He has a history of obstructive sleep apnea and in July 2017 he had undergone a sleep study which I personally reviewed.  This revealed moderate sleep apnea with an AHI of 27.9 per hour but his RDI was 42.  He had severe sleep apnea, doing rems sleep with an AHI of 56.7 per hour.  Oxygen  nadir was 87%.  He has been on CPAP therapy and is followed by Dr. Annamaria Boots.  At times, he admits to being tired.  He has not had a recent download.  His D[employer] company is [employer].   He has a history of hyperlipidemia and has been on rosuvastatin 20 mg.  2 years ago, total cholesterol was 226.  Lab work in October 2018: total cholesterol 190, triglycerides 161, LDL cholesterol was 120.  Non-HDL cholesterol was 148.    When I initially saw him, his blood pressure was elevated and on repeat by me was 160/100.  Discussed with him the new hypertensive guidelines.  I recommended further titration of lisinopril from 10 mg up to 15 mg for one week and then up to 20 mg daily.  I also recommended more aggressive lipid therapy with his elevated LDL.  I discussed the importance of smoking cessation and the need for follow-up of his aortic stenosis.  I saw him in February 2019 at which time he continued to be asymptomatic.  He was exercising regularly and potentially walking up to 5 to 7 miles per day.  Unfortunately he was still smoking cigarettes.  He had quit for short duration in his early 10s but resumed again.  He denied any chest pain, presyncope  or syncope or palpitations.    Over the last several years, he has remained stable.  He was evaluated by Chris Skinner in August 2020 for preoperative clearance prior to undergoing wisdom teeth removal and was stable.  He has had issues with hypertension and hydrochlorothiazide had been added to his lisinopril and amlodipine.  When I saw him in July 2021 he denied any chest pain, PND, orthopnea, or palpitations.  His last echo Doppler study was in September 2020 which showed normal systolic function with grade 2 diastolic dysfunction.  That time his mean valve gradient had increased to 31 mm.  Aortic valve area was 1.14 cm consistent with moderate stenosis.  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  Prior to that he was football season I recommended a  follow-up echo which was done on April 01, 2020.  LV function remains normal with EF at 60 to 65%.  There was mild LVH concentrically.  He had normal diastolic parameters.  He continued to have moderate aortic stenosis with a mean gradient of 31 and peak gradient of 56, not significantly changed from his prior study.  He continues to be asymptomatic.  I saw him in May 2022 at which time he continued to be asymptomatic with reference to his aortic stenosis and specifically denied any chest pain, palpitations, presyncope or exertional dyspnea.  Unfortunately, he was still smoking approximately 3 to 4 cigarettes/day and I discussed with him the importance of complete smoking cessation.  He walks regularly without symptoms.  School will be getting out.and summer football ws to begin on Tuesday Wednesdays and Thursdays several weeks after school is completed.  He continues to use CPAP therapy with excellent compliance.   During his evaluation I recommended he undergo a follow-up echo Doppler study as well as complete set of laboratory.  He underwent an echo Doppler study on March 03, 2021 which showed normal LV function with EF at 60 to 65% with mild concentric LVH.  He had normal strain and wall motion.  There was moderate aortic valve stenosis with a mean gradient of 28 and peak gradient of 46 and is estimated aortic valve area is 1.36 cm.  There was mild aortic regurgitation.  I last saw him on April 06, 2021.  Compared to his prior echo of 1 year previously, there was not any progression of his aortic stenosis.  He underwent laboratory which showed a TSH of 0.74, stable CBC, but studies were improved with total cholesterol now 167, triglycerides 110, HDL 45, and LDL 102 on his regimen of rosuvastatin 40 mg and Zetia 10 mg.  His blood pressure has remained stable and he is tolerating amlodipine 5 mg in addition to lisinopril HCT 20/12.5 mg.  Football practice will be initiating full-time tomorrow.  He continues to  use CPAP and he is on a waiting list to receive a new machine which is 5G compatible.   Since I last saw him, he has remained asymptomatic.  He gave up coaching football and has been caring for his mother.  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  He has been walking 3-1/2 miles per day.  He still has an occasional 3 to 4 cigarettes/day.  He denies chest pain PND orthopnea palpitations, presyncope, syncope, or change in exercise tolerance.  He presents for follow-up evaluation.  Past Medical History:  Diagnosis Date   Chest pain 2001   [place], normal cardiac workup   Dyslipidemia  H/O echocardiogram 09/2013   PFO found incidentally.  Echo done due to murmur   Heart murmur    mild aortic stenosis   Hyperlipidemia    Hypertension    Personal history of colonic polyp - adenoma 01/05/2014   Sleep apnea    cpap   Tobacco use disorder    Wears glasses     Past Surgical History:  Procedure Laterality Date   BIOPSY  01/17/2021   Procedure: BIOPSY;  Surgeon: Ladene Artist, MD;  Location[facility] ENDOSCOPY;  Service: Endoscopy;;   COLONOSCOPY  2015   ESOPHAGOGASTRODUODENOSCOPY (EGD) WITH PROPOFOL N/A 01/17/2021   Procedure: ESOPHAGOGASTRODUODENOSCOPY (EGD) WITH PROPOFOL;  Surgeon: Ladene Artist, MD;  Location: [facility] ENDOSCOPY;  Service: Endoscopy;  Laterality: N/A;   FOREIGN BODY REMOVAL  01/17/2021   Procedure: FOREIGN BODY REMOVAL;  Surgeon: Ladene Artist, MD;  Location: [facility] ENDOSCOPY;  Service: Endoscopy;;   KNEE ARTHROSCOPY Left 1985   TONSILLECTOMY AND ADENOIDECTOMY  1974    Current Medications: Outpatient Medications Prior to Visit  Medication Sig Dispense Refill   amLODipine (NORVASC) 5 MG tablet Take 1 tablet (5 mg total) by mouth daily. 90 tablet 3   Cetirizine HCl (ZYRTEC PO) Take by mouth daily.     ezetimibe (ZETIA) 10 MG tablet Take 1 tablet (10 mg total) by mouth daily. 90 tablet 3   lisinopril-hydrochlorothiazide (ZESTORETIC) 20-12.5 MG tablet Take 1 tablet by mouth  daily. 90 tablet 3   Naproxen Sodium (ALEVE PO) Take by mouth daily.     Omega-3 Fatty Acids (FISH OIL OMEGA-3 PO) Take by mouth daily.     rosuvastatin (CRESTOR) 40 MG tablet Take 1 tablet (40 mg total) by mouth daily. 90 tablet 3   ALPRAZolam (XANAX) 0.25 MG tablet Take 1 tablet (0.25 mg total) by mouth 2 (two) times daily as needed for anxiety. (Patient not taking: Reported on 12/28/2021) 20 tablet 0   chlorhexidine (PERIDEX) 0.12 % solution SMARTSIG:0.5 Ounce(s) By Mouth Twice Daily (Patient not taking: Reported on 05/17/2022)     sertraline (ZOLOFT) 100 MG tablet TAKE 1 TABLET BY MOUTH EVERY DAY (Patient not taking: Reported on 05/17/2022) 90 tablet 2   tadalafil (CIALIS) 20 MG tablet Take 1 tablet (20 mg total) by mouth daily as needed for erectile dysfunction. (Patient not taking: Reported on 05/17/2022) 30 tablet 2   No facility-administered medications prior to visit.     Allergies:   Patient has no known allergies.   Social History   Socioeconomic History   Marital status: Divorced    Spouse name: Not on file   Number of children: Not on file   Years of education: Not on file   Highest education level: Not on file  Occupational History   Occupation: high school PE teacher    Employer: [place]  Tobacco Use   Smoking status: Every Day   Smokeless tobacco: Former    Types: Chew    Quit date: 2009  Vaping Use   Vaping Use: Former  Substance and Sexual Activity   Alcohol use: Yes    Alcohol/week: 10.0 standard drinks of alcohol    Types: 10 Cans of beer per week   Drug use: No   Sexual activity: Not Currently    Comment: teach PE in high school, coaches football, exercises - walks 5 days per week; separated  Other Topics Concern   Not on file  Social History Narrative   Exercise - calisthenics,  walking in afternoons 3-5 days per week, single, 2 children   Social Determinants of Health   Financial Resource Strain: Not on file   Food Insecurity: Not on file  Transportation Needs: Not on file  Physical Activity: Not on file  Stress: Not on file  Social Connections: Not on file     Family History:  The patient's  family history includes Arthritis in his mother; Crohn's disease in his sister; Diabetes in his father and paternal grandfather; Heart disease in his father and mother; Hyperlipidemia in his mother; Stroke in his mother.   ROS General: Negative; No fevers, chills, or night sweats;  HEENT: Negative; No changes in vision or hearing, sinus congestion, difficulty swallowing Pulmonary: Negative; No cough, wheezing, shortness of breath, hemoptysis Cardiovascular: Negative; No chest pain, presyncope, syncope, palpitations GI: Negative; No nausea, vomiting, diarrhea, or abdominal pain GU: Negative; No dysuria, hematuria, or difficulty voiding Musculoskeletal: Negative; no myalgias, joint pain, or weakness Hematologic/Oncology: Negative; no easy bruising, bleeding Endocrine: Negative; no heat/cold intolerance; no diabetes Neuro: Negative; no changes in balance, headaches Skin: Negative; No rashes or skin lesions Psychiatric: Negative; No behavioral problems, depression Sleep: Negative; No snoring, daytime sleepiness, hypersomnolence, bruxism, restless legs, hypnogognic hallucinations, no cataplexy Other comprehensive 14 point system review is negative.   PHYSICAL EXAM:   VS:  BP 116/80   Pulse 62   Ht 6' 1"$  (1.854 m)   Wt 234 lb (106.1 kg)   SpO2 97%   BMI 30.87 kg/m     Repeat blood pressure by me was 120/76  Wt Readings from Last 3 Encounters:  10/18/22 234 lb (106.1 kg)  05/17/22 227 lb 9.6 oz (103.2 kg)  01/23/22 223 lb 12.8 oz (101.5 kg)    General: Alert, oriented, no distress.  Skin: normal turgor, no rashes, warm and dry HEENT: Normocephalic, atraumatic. Pupils equal round and reactive to light; sclera anicteric; extraocular muscles intact;  Nose without nasal septal  hypertrophy Mouth/Parynx benign; Mallinpatti scale Neck: No JVD, no carotid bruits; normal carotid upstroke Lungs: clear to ausculatation and percussion; no wheezing or rales Chest wall: without tenderness to palpitation Heart: PMI not displaced, RRR, s1 s2 normal, 2-3/6 mid peaking systolic murmur, trivial diastolic murmur, no rubs, gallops, thrills, or heaves Abdomen: soft, nontender; no hepatosplenomehaly, BS+; abdominal aorta nontender and not dilated by palpation. Back: no CVA tenderness Pulses 2+ Musculoskeletal: full range of motion, normal strength, no joint deformities Extremities: no clubbing cyanosis or edema, Homan's sign negative  Neurologic: grossly nonfocal; Cranial nerves grossly wnl Psychologic: Normal mood and affect    October 18, 2022 ECG (independently read by me): NSR at 77, IRBBB  April 06, 2021 ECG (independently read by me): Normal sinus rhythm at 63 bpm.  Nonspecific ST abnormality.  No ectopy.  Normal intervals.  May 2022 ECG (independently read by me): Sinus rhythm with occasional PVCs at 63 bpm.  QTc interval 446 ms  July 2021 ECG (independently read by me): NSR at 64; no ectopy; early trasition  February 2019 ECG (independently read by me): Normal sinus rhythm at 61 bpm.  No ectopy.  Normal intervals.  November 2018 ECG (independently read by me): Normal sinus rhythm at 60 bpm.  PR interval 200 ms.  No ST segment changes.  QTc interval 430 ms   Recent Labs:    Latest Ref Rng & Units 05/17/2022    9:08 AM 02/09/2021    8:22 AM 01/17/2021    1:12 PM  BMP  Glucose 70 - 99  mg/dL 115  119  112   BUN 6 - 24 mg/dL 16  18  17   $ Creatinine 0.76 - 1.27 mg/dL 0.80  0.92  0.79   BUN/Creat Ratio 9 - 20 20  20    $ Sodium 134 - 144 mmol/L 139  141  136   Potassium 3.5 - 5.2 mmol/L 4.3  4.7  3.9   Chloride 96 - 106 mmol/L 101  104  104   CO2 20 - 29 mmol/L 22  21  23   $ Calcium 8.7 - 10.2 mg/dL 9.7  9.4  9.2         Latest Ref Rng & Units 05/17/2022    9:08 AM  02/09/2021    8:22 AM 01/17/2021    1:12 PM  Hepatic Function  Total Protein 6.0 - 8.5 g/dL 6.7  6.8  7.1   Albumin 3.8 - 4.9 g/dL 4.6  4.7  4.2   AST 0 - 40 IU/L 30  27  31   $ ALT 0 - 44 IU/L 35  37  42   Alk Phosphatase 44 - 121 IU/L 53  55  47   Total Bilirubin 0.0 - 1.2 mg/dL 0.6  0.7  1.2        Latest Ref Rng & Units 05/17/2022    9:08 AM 02/09/2021    8:22 AM 01/17/2021    1:12 PM  CBC  WBC 3.4 - 10.8 x10E3/uL 8.8  8.6  13.2   Hemoglobin 13.0 - 17.7 g/dL 15.8  15.2  16.0   Hematocrit 37.5 - 51.0 % 46.5  44.3  44.6   Platelets 150 - 450 x10E3/uL 278  300  323    Lab Results  Component Value Date   MCV 95 05/17/2022   MCV 94 02/09/2021   MCV 90.3 01/17/2021   Lab Results  Component Value Date   TSH 0.741 02/09/2021   Lab Results  Component Value Date   HGBA1C 5.8 (A) 05/17/2022     BNP No results found for: "BNP"  ProBNP No results found for: "PROBNP"   Lipid Panel     Component Value Date/Time   CHOL 166 05/17/2022 0908   TRIG 101 05/17/2022 0908   HDL 50 05/17/2022 0908   CHOLHDL 3.3 05/17/2022 0908   CHOLHDL 4.5 06/18/2017 1312   VLDL 34 11/04/2014 0958   LDLCALC 98 05/17/2022 0908   LDLCALC 120 (H) 06/18/2017 1312     RADIOLOGY: No results found.   Additional studies/ records that were reviewed today include:  I reviewed the patient's prior echoes Doppler study from 09/29/2013 to most recent study of 02/20/2022.  I also personally reviewed his diagnostic polysomnogram from 03/27/2016 and recent laboratory and records from Dr. Redmond School.  ECHO; 05/21/2019 IMPRESSIONS   1. The average left ventricular global longitudinal strain is -21.4 %.   2. The left ventricle has normal systolic function, with an ejection  fraction of 55-60%. The cavity size was normal. Left ventricular diastolic  Doppler parameters are consistent with pseudonormalization. No evidence of  left ventricular regional wall  motion abnormalities.   3. The right ventricle has normal  systolic function. The cavity was  normal. There is no increase in right ventricular wall thickness. Right  ventricular systolic pressure could not be assessed.   4. Left atrial size was mildly dilated.   5. There is mild to moderate mitral annular calcification present.   6. The aortic valve is tricuspid. Moderate thickening of the aortic  valve.  Severe calcifcation of the aortic valve. Moderate stenosis of the  aortic valve. Mean AVG 54mHg. AVA 1.14cm2. Dimensionless index 36.   7. The aorta is normal unless otherwise noted.   8. Compared to prior echo the mean AVG has increased from 25 to 335mg.    ECHO: 03/03/2021 IMPRESSIONS   1. Left ventricular ejection fraction, by estimation, is 60 to 65%. Left  ventricular ejection fraction by 3D volume is 63 %. The left ventricle has  normal function. The left ventricle has no regional wall motion  abnormalities. There is mild concentric  left ventricular hypertrophy. Left ventricular diastolic parameters were  normal. The average left ventricular global longitudinal strain is -22.3  %. The global longitudinal strain is normal.   2. Right ventricular systolic function is normal. The right ventricular  size is normal.   3. The mitral valve is grossly normal. Trivial mitral valve  regurgitation.   4. The aortic valve is calcified. There is moderate calcification of the  aortic valve. There is moderate thickening of the aortic valve. Aortic  valve regurgitation is mild. Moderate aortic valve stenosis. Aortic valve  area, by VTI measures 1.36 cm.  Aortic valve mean gradient measures 28.0 mmHg. Aortic valve Vmax measures  3.40 m/s.   5. The inferior vena cava is normal in size with greater than 50%  respiratory variability, suggesting right atrial pressure of 3 mmHg.   Comparison(s): No significant change from prior study.    ECHO: 02/20/2022  1. Severely calcified aortic vlave. Aortic stenosis is now moderate to  severe (approaching  severe). Vmax 3.9 m/s, MG 36 mmHG, AVA 0.92, DI 0.29.  The aortic valve is calcified. There is severe calcifcation of the aortic  valve. There is severe thickening  of the aortic valve. Aortic valve regurgitation is mild. Moderate to  severe aortic valve stenosis.   2. Left ventricular ejection fraction, by estimation, is 60 to 65%. The  left ventricle has normal function. The left ventricle has no regional  wall motion abnormalities. Left ventricular diastolic parameters were  normal. The average left ventricular  global longitudinal strain is -20.7 %. The global longitudinal strain is  normal.   3. Right ventricular systolic function is normal. The right ventricular  size is normal. There is normal pulmonary artery systolic pressure. The  estimated right ventricular systolic pressure is [id number].   4. The mitral valve is degenerative. Trivial mitral valve regurgitation.  No evidence of mitral stenosis.   5. The inferior vena cava is normal in size with greater than 50%  respiratory variability, suggesting right atrial pressure of 3 mmHg.   Comparison(s): Changes from prior study are noted. Moderate to severe AS  is now present.    ASSESSMENT:    1. Moderate - severe aortic stenosis   2. Essential hypertension   3. Tobacco abuse   4. Dyslipidemia     PLAN:  Chris Skinner a [age]ear-old gentleman who has a history of hypertension and hyperlipidemia.   When I initially saw him he was hypertensive with stage II hypertension despite being on lisinopril 10 mg.  At that time I further titrated lisinopril to 20 mg daily.  He was on rosuvastatin for hyperlipidemia.  His initial echo Doppler study in January 2015 showed mild aortic stenosis with a mean gradient of 10, and peak gradient of 18 mm.  He had normal LV function.  A subsequent echo 3 years later in October 2018 showed an EF of 55 to  60% and there was slight progression of his AS now mild to moderate with a mean gradient of 22  and peak gradient at 46 mm.  An echo in 2020 showed additional progression of his aortic stenosis with an EF of 55 to 60% and grade 2 diastolic dysfunction.  Mean gradient was now 31 mm and peak gradient was 55.8 mm.  Aortic valve area was 1.1 cm consistent with at least moderate aortic stenosis.  His echo in July 2021 essentially was unchanged with EF 60 to 65%, mild concentric LVH, normal diastolic parameters, and moderate aortic stenosis with a mean gradient of 31 and peak gradient of 50.1.  Aortic valve area was 0.93 cm.  When I saw him in May 2022, he continued to be asymptomatic.  He had been exercising more and felt well.  Subsequently, he had an echo in June 2022 and most recently on February 20, 2022.  EF continued to be 60 to 65% without LVH and normal diastolic parameters.  There was mild to moderate mitral annular calcification.  His aortic stenosis had progressed and was now felt to be moderate to severe with a mean gradient of 36 and peak gradient at 63 mmHg.  Presently, Chris Skinner remains asymptomatic and denies any change in his exercise capacity and walks 3-1/2 miles per day without chest pain or shortness of breath.  I discussed with him that his aortic stenosis is progressing and he may ultimately require valve replacement.  With his age, he most likely has functionally bicuspid valve.  I am recommending that he undergo a follow-up echo evaluation in 4 months or in June of this year and I will see him in follow-up shortly thereafter.  His blood pressure today is stable on amlodipine 5 mg and lisinopril HCT 20/12.5 mg.  He continues to be on aggressive lipid-lowering therapy with rosuvastatin 40 mg and Zetia 10 mg in addition to over-the-counter omega-3 fatty acids.  He sees Dr. [name] for primary care who checks laboratory.  I discussed the importance of complete tobacco cessation.  He was made aware that if he develops any symptoms of chest tightness, increasing shortness of breath,  presyncope or syncope that he contact our office for an earlier evaluation.    Medication Adjustments/Labs and Tests Ordered: Current medicines are reviewed at length with the patient today.  Concerns regarding medicines are outlined above.  Medication changes, Labs and Tests ordered today are listed in the Patient Instructions below. Patient Instructions  Medication Instructions:  Your physician recommends that you continue on your current medications as directed. Please refer to the Current Medication list given to you today.  *If you need a refill on your cardiac medications before your next appointment, please call your pharmacy*   Lab Work: None If you have labs (blood work) drawn today and your tests are completely normal, you will receive your results only by:   Testing/Procedures: Your physician has requested that you have an echocardiogram in June. Echocardiography is a painless test that uses sound waves to create images of your heart. It provides your doctor with information about the size and shape of your heart and how well your heart's chambers and valves are working. This procedure takes approximately one hour. There are no restrictions for this procedure. Please do NOT wear cologne, perfume, aftershave, or lotions (deodorant is allowed). Please arrive 15 minutes prior to your appointment time.    Follow-Up: At [facility], you and your health needs are our priority.  As part of our continuing mission to provide you with exceptional heart care, we have created designated Provider Care Teams.  These Care Teams include your primary Cardiologist (physician) and Advanced Practice Providers (APPs -  Physician Assistants and Nurse Practitioners) who all work together to provide you with the care you need, when you need it.  We recommend signing up for the patient portal called "MyChart".  Sign up information is provided on this After Visit Summary.  MyChart is used to  connect with patients for Virtual Visits (Telemedicine).  Patients are able to view lab/test results, encounter notes, upcoming appointments, etc.  Non-urgent messages can be sent to your provider as well.   To learn more about what you can do with MyChart, go to [email or web address].    Your next appointment:    July  Provider:   Shelva Majestic, MD       Signed, Shelva Majestic, MD  10/22/2022 1:27 PM    [facility] [address] [place], [place], [place]  [address] Phone: [phone]

## 2022-10-18 NOTE — Patient Instructions (Signed)
Medication Instructions:  Your physician recommends that you continue on your current medications as directed. Please refer to the Current Medication list given to you today.  *If you need a refill on your cardiac medications before your next appointment, please call your pharmacy*   Lab Work: None If you have labs (blood work) drawn today and your tests are completely normal, you will receive your results only by:   Testing/Procedures: Your physician has requested that you have an echocardiogram in June. Echocardiography is a painless test that uses sound waves to create images of your heart. It provides your doctor with information about the size and shape of your heart and how well your heart's chambers and valves are working. This procedure takes approximately one hour. There are no restrictions for this procedure. Please do NOT wear cologne, perfume, aftershave, or lotions (deodorant is allowed). Please arrive 15 minutes prior to your appointment time.    Follow-Up: At [facility], you and your health needs are our priority.  As part of our continuing mission to provide you with exceptional heart care, we have created designated Provider Care Teams.  These Care Teams include your primary Cardiologist (physician) and Advanced Practice Providers (APPs -  Physician Assistants and Nurse Practitioners) who all work together to provide you with the care you need, when you need it.  We recommend signing up for the patient portal called "MyChart".  Sign up information is provided on this After Visit Summary.  MyChart is used to connect with patients for Virtual Visits (Telemedicine).  Patients are able to view lab/test results, encounter notes, upcoming appointments, etc.  Non-urgent messages can be sent to your provider as well.   To learn more about what you can do with MyChart, go to [email or web address].    Your next appointment:    July  Provider:   Shelva Majestic, MD

## 2022-10-22 ENCOUNTER — Encounter: Payer: Self-pay | Admitting: Cardiovascular Disease

## 2023-02-21 ENCOUNTER — Ambulatory Visit (HOSPITAL_COMMUNITY): Payer: BC Managed Care – PPO | Attending: Cardiology

## 2023-02-21 DIAGNOSIS — I350 Nonrheumatic aortic (valve) stenosis: Secondary | ICD-10-CM | POA: Diagnosis not present

## 2023-02-21 LAB — ECHOCARDIOGRAM COMPLETE
AR max vel: 0.93 cm2
AV Area VTI: 1.01 cm2
AV Area mean vel: 0.85 cm2
AV Mean grad: 39.7 mmHg
AV Peak grad: 74.5 mmHg
Ao pk vel: 4.32 m/s
Area-P 1/2: 2.37 cm2
P 1/2 time: 642 msec
S' Lateral: 2.4 cm

## 2023-05-23 ENCOUNTER — Encounter: Payer: Self-pay | Admitting: Family Medicine

## 2023-05-23 ENCOUNTER — Ambulatory Visit (INDEPENDENT_AMBULATORY_CARE_PROVIDER_SITE_OTHER): Payer: BC Managed Care – PPO | Admitting: Family Medicine

## 2023-05-23 VITALS — BP 118/80 | HR 62 | Ht 73.0 in | Wt 230.8 lb

## 2023-05-23 DIAGNOSIS — G4730 Sleep apnea, unspecified: Secondary | ICD-10-CM

## 2023-05-23 DIAGNOSIS — E669 Obesity, unspecified: Secondary | ICD-10-CM

## 2023-05-23 DIAGNOSIS — Z23 Encounter for immunization: Secondary | ICD-10-CM

## 2023-05-23 DIAGNOSIS — Z0000 Encounter for general adult medical examination without abnormal findings: Secondary | ICD-10-CM

## 2023-05-23 DIAGNOSIS — E785 Hyperlipidemia, unspecified: Secondary | ICD-10-CM

## 2023-05-23 DIAGNOSIS — E119 Type 2 diabetes mellitus without complications: Secondary | ICD-10-CM | POA: Diagnosis not present

## 2023-05-23 DIAGNOSIS — Z8616 Personal history of COVID-19: Secondary | ICD-10-CM

## 2023-05-23 DIAGNOSIS — I10 Essential (primary) hypertension: Secondary | ICD-10-CM

## 2023-05-23 DIAGNOSIS — I350 Nonrheumatic aortic (valve) stenosis: Secondary | ICD-10-CM | POA: Diagnosis not present

## 2023-05-23 DIAGNOSIS — N5201 Erectile dysfunction due to arterial insufficiency: Secondary | ICD-10-CM

## 2023-05-23 DIAGNOSIS — Z86010 Personal history of colonic polyps: Secondary | ICD-10-CM

## 2023-05-23 DIAGNOSIS — Z8042 Family history of malignant neoplasm of prostate: Secondary | ICD-10-CM

## 2023-05-23 DIAGNOSIS — F17200 Nicotine dependence, unspecified, uncomplicated: Secondary | ICD-10-CM

## 2023-05-23 DIAGNOSIS — E7439 Other disorders of intestinal carbohydrate absorption: Secondary | ICD-10-CM

## 2023-05-23 LAB — POCT GLYCOSYLATED HEMOGLOBIN (HGB A1C): Hemoglobin A1C: 6.7 % — AB (ref 4.0–5.6)

## 2023-05-23 MED ORDER — ROSUVASTATIN CALCIUM 40 MG PO TABS: 40.0000 mg | ORAL_TABLET | Freq: Every day | ORAL | 3 refills | Status: DC

## 2023-05-23 MED ORDER — DAPAGLIFLOZIN PROPANEDIOL 5 MG PO TABS
5.0000 mg | ORAL_TABLET | Freq: Every day | ORAL | 1 refills | Status: DC
Start: 2023-05-23 — End: 2023-11-26

## 2023-05-23 MED ORDER — LISINOPRIL-HYDROCHLOROTHIAZIDE 20-12.5 MG PO TABS: 1.0000 | ORAL_TABLET | Freq: Every day | ORAL | 3 refills | Status: DC

## 2023-05-23 MED ORDER — AMLODIPINE BESYLATE 5 MG PO TABS: 5.0000 mg | ORAL_TABLET | Freq: Every day | ORAL | 3 refills | Status: DC

## 2023-05-23 MED ORDER — EZETIMIBE 10 MG PO TABS: 10.0000 mg | ORAL_TABLET | Freq: Every day | ORAL | 3 refills | Status: DC

## 2023-05-23 NOTE — Progress Notes (Signed)
Complete physical exam  Patient: Chris Skinner   DOB: [birth year]   57 y.o. Male  MRN: [id number]  Subjective:    Chief Complaint  Patient presents with   Annual Exam    Fasting. Wants covid.    Mole    Right shoulder. No pain, swelling, or tenderness. Noticed 3 months ago    Chris Skinner is a 57 y.o. male who presents today for a complete physical exam. He reports consuming a low fat diet. 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 He generally feels well. He reports sleeping fairly well. He does have additional problems to discuss today.  He does have OSA and is using his CPAP.  He gets good results with that and is followed by Dr. Tresa Endo for this.  He also has aortic stenosis and was recently seen by cardiology.  Presently he is asymptomatic.  Does have a history of colonic polyp and is scheduled for repeat colonoscopy in 2029.  Continues on lisinopril, amlodipine and is also taking Zetia and Crestor.  His allergies seem to be under good control.  He smokes but usually less than a half a pack per day.  His father did have prostate cancer.  He does have underlying ED and does have medication in case is needed.  Also has a previous history of glucose intolerance.   Most recent fall risk assessment:    05/23/2023    8:23 AM  Fall Risk   Falls in the past year? 0  Number falls in past yr: 0  Injury with Fall? 0  Follow up Falls evaluation completed     Most recent depression screenings:    05/23/2023    8:23 AM 05/17/2022    8:28 AM  PHQ 2/9 Scores  PHQ - 2 Score 0 0    Vision:Within last year and Dental: Current dental problems and Last dental visit: May 2024    Patient Care Team: Ronnald Nian, MD as PCP - General (Family Medicine) Lennette Bihari, MD as PCP - Cardiology (Cardiology)   Outpatient Medications Prior to Visit  Medication Sig   Cetirizine HCl (ZYRTEC PO) Take by mouth daily.   Naproxen Sodium (ALEVE PO) Take by mouth  daily.   Omega-3 Fatty Acids (FISH OIL OMEGA-3 PO) Take by mouth daily.   [DISCONTINUED] amLODipine (NORVASC) 5 MG tablet Take 1 tablet (5 mg total) by mouth daily.   [DISCONTINUED] ezetimibe (ZETIA) 10 MG tablet Take 1 tablet (10 mg total) by mouth daily.   [DISCONTINUED] lisinopril-hydrochlorothiazide (ZESTORETIC) 20-12.5 MG tablet Take 1 tablet by mouth daily.   [DISCONTINUED] rosuvastatin (CRESTOR) 40 MG tablet Take 1 tablet (40 mg total) by mouth daily.   No facility-administered medications prior to visit.    Review of Systems  All other systems reviewed and are negative.         Objective:       Physical Exam  Alert and in no distress. Tympanic membranes and canals are normal. Pharyngeal area is normal. Neck is supple without adenopathy or thyromegaly. Cardiac exam shows a regular sinus rhythm with with a grade 4 systolic murmur lungs are clear to auscultation. Hemoglobin A1c is 6.7     Assessment & Plan:    Routine general medical examination at a health care facility - Plan: CBC with Differential/Platelet, Comprehensive metabolic panel, Lipid panel  Smoker  Sleep apnea, unspecified type  Obesity (BMI 30.0-34.9)  Severe aortic stenosis  History  of COVID-19  History of colonic polyps  Family history of prostate cancer in father - Plan: PSA  Essential hypertension - Plan: amLODipine (NORVASC) 5 MG tablet, lisinopril-hydrochlorothiazide (ZESTORETIC) 20-12.5 MG tablet  Erectile dysfunction due to arterial insufficiency  Dyslipidemia, goal LDL below 100 - Plan: ezetimibe (ZETIA) 10 MG tablet, rosuvastatin (CRESTOR) 40 MG tablet  Need for influenza vaccination  Need for COVID-19 vaccine - Plan: Pfizer Comirnaty Covid -19 Vaccine 70yrs and older  New onset type 2 diabetes mellitus (HCC) - Plan: POCT glycosylated hemoglobin (Hb A1C), dapagliflozin propanediol (FARXIGA) 5 MG TABS tablet  Immunization History  Administered Date(s) Administered   COVID-19,  mRNA, vaccine(Comirnaty)12 years and older 05/23/2023   Hepatitis B 04/16/2012   Hepatitis B, ADULT 09/09/2013   Influenza,inj,Quad PF,6+ Mos 06/18/2017, 05/17/2022   MMR 03/10/2011, 09/09/2013   Moderna Sars-Covid-2 Vaccination 10/10/2019, 11/07/2019, 06/29/2020   PPD Test 02/28/2011, 03/23/2017   Tdap 04/03/2008, 04/26/2020   Zoster Recombinant(Shingrix) 05/17/2022, 07/20/2022    Health Maintenance  Topic Date Due   HIV Screening  Never done   Diabetic kidney evaluation - Urine ACR  Never done   Diabetic kidney evaluation - eGFR measurement  05/18/2023   INFLUENZA VACCINE  12/10/2023 (Originally 04/12/2023)   COVID-19 Vaccine (5 - 2023-24 season) 07/18/2023   Colonoscopy  07/26/2028   DTaP/Tdap/Td (3 - Td or Tdap) 04/26/2030   Hepatitis C Screening  Completed   Zoster Vaccines- Shingrix  Completed   HPV VACCINES  Aged Out    Discussed the diagnosis of diabetes with him in regard to diet, exercise, medications.  Recommend he go to the American diabetes Association website to get more information on that.  Discussed having more frequent but smaller meals.  Also discussed placing him on Farxiga with benefits as well as risks.  Recheck here in 4 months. Problem List Items Addressed This Visit     Dyslipidemia, goal LDL below 100 (Chronic)   Relevant Medications   ezetimibe (ZETIA) 10 MG tablet   rosuvastatin (CRESTOR) 40 MG tablet   amLODipine (NORVASC) 5 MG tablet   lisinopril-hydrochlorothiazide (ZESTORETIC) 20-12.5 MG tablet   Moderate aortic stenosis (Chronic)   Relevant Medications   ezetimibe (ZETIA) 10 MG tablet   rosuvastatin (CRESTOR) 40 MG tablet   amLODipine (NORVASC) 5 MG tablet   lisinopril-hydrochlorothiazide (ZESTORETIC) 20-12.5 MG tablet   Sleep apnea (Chronic)   Erectile dysfunction   Essential hypertension   Relevant Medications   ezetimibe (ZETIA) 10 MG tablet   rosuvastatin (CRESTOR) 40 MG tablet   amLODipine (NORVASC) 5 MG tablet    lisinopril-hydrochlorothiazide (ZESTORETIC) 20-12.5 MG tablet   Family history of prostate cancer in father   Relevant Orders   PSA   History of colonic polyps   History of COVID-19   Obesity (BMI 30.0-34.9)   Smoker   Other Visit Diagnoses     Routine general medical examination at a health care facility    -  Primary   Relevant Orders   CBC with Differential/Platelet   Comprehensive metabolic panel   Lipid panel   Need for influenza vaccination       Need for COVID-19 vaccine       Relevant Orders   Pfizer Comirnaty Covid -19 Vaccine 5yrs and older (Completed)   New onset type 2 diabetes mellitus (HCC)       Relevant Medications   rosuvastatin (CRESTOR) 40 MG tablet   lisinopril-hydrochlorothiazide (ZESTORETIC) 20-12.5 MG tablet   dapagliflozin propanediol (FARXIGA) 5 MG TABS tablet   Other  Relevant Orders   POCT glycosylated hemoglobin (Hb A1C) (Completed)      Return in about 4 months (around 09/22/2023).     Sharlot Gowda, MD

## 2023-05-23 NOTE — Patient Instructions (Signed)
Go to the American diabetes Association website and learn more about diabetes

## 2023-05-24 LAB — CBC WITH DIFFERENTIAL/PLATELET
Basophils Absolute: 0.1 10*3/uL (ref 0.0–0.2)
Basos: 1 %
EOS (ABSOLUTE): 0.5 10*3/uL — ABNORMAL HIGH (ref 0.0–0.4)
Eos: 4 %
Hematocrit: 45.5 % (ref 37.5–51.0)
Hemoglobin: 15.7 g/dL (ref 13.0–17.7)
Immature Grans (Abs): 0 10*3/uL (ref 0.0–0.1)
Immature Granulocytes: 0 %
Lymphocytes Absolute: 2.3 10*3/uL (ref 0.7–3.1)
Lymphs: 23 %
MCH: 32.4 pg (ref 26.6–33.0)
MCHC: 34.5 g/dL (ref 31.5–35.7)
MCV: 94 fL (ref 79–97)
Monocytes Absolute: 0.9 10*3/uL (ref 0.1–0.9)
Monocytes: 9 %
Neutrophils Absolute: 6.4 10*3/uL (ref 1.4–7.0)
Neutrophils: 63 %
Platelets: 326 10*3/uL (ref 150–450)
RBC: 4.85 x10E6/uL (ref 4.14–5.80)
RDW: 11.9 % (ref 11.6–15.4)
WBC: 10.1 10*3/uL (ref 3.4–10.8)

## 2023-05-24 LAB — COMPREHENSIVE METABOLIC PANEL
ALT: 31 IU/L (ref 0–44)
AST: 27 IU/L (ref 0–40)
Albumin: 4.5 g/dL (ref 3.8–4.9)
Alkaline Phosphatase: 57 IU/L (ref 44–121)
BUN/Creatinine Ratio: 16 (ref 9–20)
BUN: 12 mg/dL (ref 6–24)
Bilirubin Total: 0.7 mg/dL (ref 0.0–1.2)
CO2: 20 mmol/L (ref 20–29)
Calcium: 9.4 mg/dL (ref 8.7–10.2)
Chloride: 100 mmol/L (ref 96–106)
Creatinine, Ser: 0.77 mg/dL (ref 0.76–1.27)
Globulin, Total: 2.2 g/dL (ref 1.5–4.5)
Glucose: 133 mg/dL — ABNORMAL HIGH (ref 70–99)
Potassium: 4 mmol/L (ref 3.5–5.2)
Sodium: 136 mmol/L (ref 134–144)
Total Protein: 6.7 g/dL (ref 6.0–8.5)
eGFR: 103 mL/min/{1.73_m2} (ref >59)

## 2023-05-24 LAB — LIPID PANEL
Chol/HDL Ratio: 3.3 ratio (ref 0.0–5.0)
Cholesterol, Total: 166 mg/dL (ref 100–199)
HDL: 50 mg/dL (ref >39)
LDL Chol Calc (NIH): 99 mg/dL (ref 0–99)
Triglycerides: 90 mg/dL (ref 0–149)
VLDL Cholesterol Cal: 17 mg/dL (ref 5–40)

## 2023-05-24 LAB — PSA: Prostate Specific Ag, Serum: 0.9 ng/mL (ref 0.0–4.0)

## 2023-07-15 ENCOUNTER — Other Ambulatory Visit: Payer: Self-pay | Admitting: Family Medicine

## 2023-07-15 DIAGNOSIS — E785 Hyperlipidemia, unspecified: Secondary | ICD-10-CM

## 2023-09-23 DIAGNOSIS — E1165 Type 2 diabetes mellitus with hyperglycemia: Secondary | ICD-10-CM | POA: Insufficient documentation

## 2023-09-25 ENCOUNTER — Encounter: Payer: Self-pay | Admitting: Family Medicine

## 2023-09-25 ENCOUNTER — Ambulatory Visit: Payer: BC Managed Care – PPO | Admitting: Family Medicine

## 2023-09-25 VITALS — BP 132/76 | HR 66 | Ht 73.0 in | Wt 224.0 lb

## 2023-09-25 DIAGNOSIS — E785 Hyperlipidemia, unspecified: Secondary | ICD-10-CM | POA: Diagnosis not present

## 2023-09-25 DIAGNOSIS — F17200 Nicotine dependence, unspecified, uncomplicated: Secondary | ICD-10-CM | POA: Diagnosis not present

## 2023-09-25 DIAGNOSIS — E1165 Type 2 diabetes mellitus with hyperglycemia: Secondary | ICD-10-CM | POA: Diagnosis not present

## 2023-09-25 DIAGNOSIS — I10 Essential (primary) hypertension: Secondary | ICD-10-CM | POA: Diagnosis not present

## 2023-09-25 LAB — POCT GLYCOSYLATED HEMOGLOBIN (HGB A1C): Hemoglobin A1C: 6.1 % — AB (ref 4.0–5.6)

## 2023-09-25 NOTE — Progress Notes (Signed)
  Subjective:    Patient ID: Chris Skinner, male    DOB: May 09, 1966, 58 y.o.   MRN: [id number]  Chris Skinner is a 58 y.o. male who presents for follow-up of Type 2 diabetes mellitus.  Home blood sugar records:  Not done Current symptoms/problems include none and have been stable. Daily foot checks:   Any foot concerns: none How often blood sugars checked: Exercise: Regularly Diet: Regular He continues on amlodipine , lisinopril /HCTZ and is also taking Crestor  and Zetia .  He has been taking Farxiga  and did initially have some slight difficulties with that however presently he is having no symptoms.  He does note a 7 pound weight loss during the same timeframe.  He smokes but is now down to less than a half a pack per day. The following portions of the patient's history were reviewed and updated as appropriate: allergies, current medications, past medical history, past social history and problem list.  ROS as in subjective above.     Objective:    Physical Exam Alert and in no distress otherwise not examined. Hemoglobin A1c is 6.1 Blood pressure 132/76, pulse 66, height 6' 1 (1.854 m), weight 224 lb (101.6 kg).  Lab Review    Latest Ref Rng & Units 09/25/2023    3:36 PM 05/23/2023    9:27 AM 05/23/2023    9:18 AM 05/17/2022    9:38 AM 05/17/2022    9:08 AM  Diabetic Labs  HbA1c 4.0 - 5.6 % 6.1  6.7   5.8    Chol 100 - 199 mg/dL   833   833   HDL >60 mg/dL   50   50   Calc LDL 0 - 99 mg/dL   99   98   Triglycerides 0 - 149 mg/dL   90   898   Creatinine 0.76 - 1.27 mg/dL   9.22   9.19       8/85/7974    3:12 PM 05/23/2023    8:23 AM 10/18/2022    4:29 PM 05/17/2022    8:31 AM 01/23/2022    3:12 PM  BP/Weight  Systolic BP 132 118 116 104 110  Diastolic BP 76 80 80 66 72  Wt. (Lbs) 224 230.8 234 227.6 223.8  BMI 29.55 kg/m2 30.45 kg/m2 30.87 kg/m2 30.87 kg/m2 30.35 kg/m2       No data to display          [name]  reports that he has been smoking. He quit smokeless tobacco use  about 16 years ago.  His smokeless tobacco use included chew. He reports current alcohol use of about 10.0 standard drinks of alcohol per week. He reports that he does not use drugs.     Assessment & Plan:    Type 2 diabetes mellitus with hyperglycemia, without long-term current use of insulin (HCC) - Plan: POCT glycosylated hemoglobin (Hb A1C)  Dyslipidemia, goal LDL below 100  Essential hypertension  Smoker  I congratulated him on the work that he has done and recheck here in roughly 6 months.

## 2023-11-06 ENCOUNTER — Encounter: Payer: Self-pay | Admitting: Internal Medicine

## 2023-11-25 ENCOUNTER — Other Ambulatory Visit: Payer: Self-pay | Admitting: Family Medicine

## 2023-11-25 DIAGNOSIS — E119 Type 2 diabetes mellitus without complications: Secondary | ICD-10-CM

## 2024-02-15 ENCOUNTER — Telehealth: Payer: Self-pay | Admitting: Family Medicine

## 2024-02-15 NOTE — Telephone Encounter (Signed)
 Eain wants to know what cardiologist you recommend, his is retiring ( Dr Loetta Ringer)

## 2024-02-19 NOTE — Telephone Encounter (Signed)
 Advised patient of cardiologist recommendation per Dr Robina Chol  ( Dr Jon Negri)  [facility]

## 2024-02-25 ENCOUNTER — Other Ambulatory Visit: Payer: Self-pay | Admitting: Family Medicine

## 2024-02-25 DIAGNOSIS — E119 Type 2 diabetes mellitus without complications: Secondary | ICD-10-CM

## 2024-02-25 MED ORDER — DAPAGLIFLOZIN PROPANEDIOL 5 MG PO TABS: 5.0000 mg | ORAL_TABLET | Freq: Every day | ORAL | 0 refills | Status: DC

## 2024-02-25 NOTE — Telephone Encounter (Signed)
 Copied from CRM [id number]. Topic: Clinical - Medication Refill >> Feb 25, 2024  9:37 AM Trula Gable C wrote: Medication:  dapagliflozin  propanediol (FARXIGA ) 5 MG TABS tablet    Has the patient contacted their pharmacy? Yes (Agent: If no, request that the patient contact the pharmacy for the refill. If patient does not wish to contact the pharmacy document the reason why and proceed with request.) (Agent: If yes, when and what did the pharmacy advise?)  This is the patient's preferred pharmacy:    [facility] [address], [place] - [address] [facility] [address] [place] [address] Phone: [phone] Fax: [phone]  Is this the correct pharmacy for this prescription? Yes If no, delete pharmacy and type the correct one.   Has the prescription been filled recently? No  Is the patient out of the medication? Yes  Has the patient been seen for an appointment in the last year OR does the patient have an upcoming appointment? Yes  Can we respond through MyChart? Yes  Agent: Please be advised that Rx refills may take up to 3 business days. We ask that you follow-up with your pharmacy.

## 2024-03-25 ENCOUNTER — Ambulatory Visit (INDEPENDENT_AMBULATORY_CARE_PROVIDER_SITE_OTHER): Payer: 59 | Admitting: Family Medicine

## 2024-03-25 ENCOUNTER — Encounter: Payer: Self-pay | Admitting: Family Medicine

## 2024-03-25 VITALS — BP 126/74 | HR 50 | Wt 223.0 lb

## 2024-03-25 DIAGNOSIS — E1165 Type 2 diabetes mellitus with hyperglycemia: Secondary | ICD-10-CM

## 2024-03-25 DIAGNOSIS — E1169 Type 2 diabetes mellitus with other specified complication: Secondary | ICD-10-CM

## 2024-03-25 DIAGNOSIS — I152 Hypertension secondary to endocrine disorders: Secondary | ICD-10-CM

## 2024-03-25 DIAGNOSIS — E785 Hyperlipidemia, unspecified: Secondary | ICD-10-CM

## 2024-03-25 DIAGNOSIS — Z23 Encounter for immunization: Secondary | ICD-10-CM

## 2024-03-25 DIAGNOSIS — E1159 Type 2 diabetes mellitus with other circulatory complications: Secondary | ICD-10-CM | POA: Diagnosis not present

## 2024-03-25 LAB — POCT GLYCOSYLATED HEMOGLOBIN (HGB A1C): Hemoglobin A1C: 6.6 % — AB (ref 4.0–5.6)

## 2024-03-25 NOTE — Progress Notes (Signed)
 Subjective:    Patient ID: Chris Skinner, male    DOB: October 20, 1965, 58 y.o.   MRN: [id number]  Chris Skinner is a 58 y.o. male who presents for follow-up of Type 2 diabetes mellitus.  Home blood sugar records: does not record.  Current symptoms/problems include none and have been improving. Daily foot checks: yes   Any foot concerns: no How often blood sugars checked: does not check sugars.  Exercise: PE teacher.  Diet: general diet.  He continues on Farxiga  and is having no difficulty with that.  He is also taking amlodipine  as well as lisinopril /HCTZ.  Continues on Crestor  and Zetia .  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  He does drink on weekends.  He has no other concerns or complaints. The following portions of the patient's history were reviewed and updated as appropriate: allergies, current medications, past medical history, past social history and problem list.  ROS as in subjective above.     Objective:    Physical Exam Alert and in no distress otherwise not examined. Hemoglobin A1c is 6.6 Blood pressure 126/74, pulse (!) 50, weight 223 lb (101.2 kg), SpO2 96%.  Lab Review    Latest Ref Rng & Units 09/25/2023    3:36 PM 05/23/2023    9:27 AM 05/23/2023    9:18 AM 05/17/2022    9:38 AM 05/17/2022    9:08 AM  Diabetic Labs  HbA1c 4.0 - 5.6 % 6.1  6.7   5.8    Chol 100 - 199 mg/dL   833   833   HDL >60 mg/dL   50   50   Calc LDL 0 - 99 mg/dL   99   98   Triglycerides 0 - 149 mg/dL   90   898   Creatinine 0.76 - 1.27 mg/dL   9.22   9.19       2/84/7974    8:09 AM 09/25/2023    3:12 PM 05/23/2023    8:23 AM 10/18/2022    4:29 PM 05/17/2022    8:31 AM  BP/Weight  Systolic BP 126 132 118 116 104  Diastolic BP 74 76 80 80 66  Wt. (Lbs) 223 224 230.8 234 227.6  BMI 29.42 kg/m2 29.55 kg/m2 30.45 kg/m2 30.87 kg/m2 30.87 kg/m2       No data to display          Swanson  reports that he has  been smoking. He quit smokeless tobacco use about 16 years ago.  His smokeless tobacco use included chew. He reports current alcohol use of about 10.0 standard drinks of alcohol per week. He reports that he does not use drugs.     Assessment & Plan:    Type 2 diabetes mellitus with hyperglycemia, without long-term current use of insulin (HCC) - Plan: POCT glycosylated hemoglobin (Hb A1C)  Hypertension associated with diabetes (HCC)  Hyperlipidemia associated with type 2 diabetes mellitus (HCC) - Plan: Lipid panel  Need for vaccination against Streptococcus pneumoniae - Plan: Pneumococcal conjugate vaccine 20-valent (Prevnar 20)  Rx changes: none Education: Reviewed 'ABCs' of diabetes management (respective goals in parentheses):  A1C (<7), blood pressure (<130/80), and cholesterol (LDL <100). Compliance at present is estimated to be good. Efforts to improve compliance (if necessary) will be directed at increased exercise. Follow up: 6 months Discussed the possibility of placing him on a GLP-1 with his next visit

## 2024-03-26 ENCOUNTER — Ambulatory Visit: Payer: Self-pay | Admitting: Family Medicine

## 2024-03-26 LAB — LIPID PANEL
Chol/HDL Ratio: 3.9 ratio (ref 0.0–5.0)
Cholesterol, Total: 175 mg/dL (ref 100–199)
HDL: 45 mg/dL (ref >39)
LDL Chol Calc (NIH): 99 mg/dL (ref 0–99)
Triglycerides: 178 mg/dL — ABNORMAL HIGH (ref 0–149)
VLDL Cholesterol Cal: 31 mg/dL (ref 5–40)

## 2024-05-29 ENCOUNTER — Other Ambulatory Visit: Payer: Self-pay | Admitting: Family Medicine

## 2024-05-29 DIAGNOSIS — E119 Type 2 diabetes mellitus without complications: Secondary | ICD-10-CM

## 2024-05-29 NOTE — Telephone Encounter (Unsigned)
 Copied from CRM [id number]. Topic: Clinical - Medication Refill >> May 29, 2024  4:31 PM Teressa P wrote: Medication: Farxiga  5 mg   Has the patient contacted their pharmacy? Yes (Agent: If no, request that the patient contact the pharmacy for the refill. If patient does not wish to contact the pharmacy document the reason why and proceed with request.) (Agent: If yes, when and what did the pharmacy advise?)  This is the patient's preferred pharmacy:  [id number] [place], [place] - [address] [place] [address] Phone: [phone] Fax: [phone]  Is this the correct pharmacy for this prescription? Yes If no, delete pharmacy and type the correct one.   Has the prescription been filled recently? Yes  Is the patient out of the medication? No  Has the patient been seen for an appointment in the last year OR does the patient have an upcoming appointment? Yes  Can we respond through MyChart? Yes   Agent: Please be advised that Rx refills may take up to 3 business days. We ask that you follow-up with your pharmacy.

## 2024-05-30 MED ORDER — DAPAGLIFLOZIN PROPANEDIOL 5 MG PO TABS: 5.0000 mg | ORAL_TABLET | Freq: Every day | ORAL | 0 refills | Status: DC

## 2024-06-12 NOTE — Progress Notes (Unsigned)
 Cardiology Office Note:  .   Date:  06/13/2024  ID:  Chris Skinner, DOB May 03, 1966, MRN [id number] PCP: [name], MD  [facility] Providers Cardiologist:  Debby Sor, MD (Inactive) {  History of Present Illness: .    Chief Complaint  Patient presents with   Follow-up    RA PFIESTER is a 58 y.o. male with history of severe aortic stenosis who presents for follow-up.    History of Present Illness   ROI Chris Skinner is a 58 year old male with bicuspid aortic valve and severe stenosis who presents for follow-up.  He has a history of bicuspid aortic valve with severe stenosis. No current symptoms such as chest pain or dyspnea are present. 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 His last echocardiogram showed a mean gradient of 39 mmHg.  He has hypertension and takes amlodipine  5 mg daily, Farxiga  5 mg daily, and lisinopril  HCTZ 20/12.5 mg daily. No symptoms of angina are reported.  He has diabetes, currently managed with Farxiga . He describes his diabetes as being 'on the fence' and has recently met the criteria for diagnosis.  He has hyperlipidemia and takes Crestor  40 mg daily, with a recent LDL of 99 mg/dL.  Family history is significant for heart disease in both parents and aortic valve stenosis in his grandmother.  Socially, he smokes less than five cigarettes a day and consumes alcohol. He is married with two sons and works at [employer] in [place].  No chest pain, trouble breathing, or limitations in activity.            Problem List Bicuspid aortic valve Severe aortic stenosis  -Vmax 4.3 m/s MG 39 mmHG 02/2023 3. HTN 4. HLD -T chol 175, HDL 45, LDL 99, TG 178 5. DM -A1c 6.6    ROS: All other ROS reviewed and negative. Pertinent positives noted in the HPI.     Studies Reviewed: SABRA   EKG Interpretation Date/Time:  Friday June 13 2024 07:58:31 EDT Ventricular Rate:  59 PR  Interval:  188 QRS Duration:  104 QT Interval:  448 QTC Calculation: 443 R Axis:   1  Text Interpretation: Sinus bradycardia with sinus arrhythmia Possible Inferior infarct , age undetermined Confirmed by Barbaraann Kotyk [phone]) on 06/13/2024 8:06:50 AM   Physical Exam:   VS:  BP 112/74   Pulse (!) 59   Ht 6' 1.5 (1.867 m)   Wt 225 lb 3.2 oz (102.2 kg)   SpO2 98%   BMI 29.31 kg/m    Wt Readings from Last 3 Encounters:  06/13/24 225 lb 3.2 oz (102.2 kg)  03/25/24 223 lb (101.2 kg)  09/25/23 224 lb (101.6 kg)    GEN: Well nourished, well developed in no acute distress NECK: No JVD; No carotid bruits CARDIAC: RRR, 3/6 SEM, late peaking RESPIRATORY:  Clear to auscultation without rales, wheezing or rhonchi  ABDOMEN: Soft, non-tender, non-distended EXTREMITIES:  No edema; No deformity  ASSESSMENT AND PLAN: .   Assessment and Plan    Severe bicuspid aortic valve stenosis Severe aortic stenosis with bicuspid valve. Last mean gradient 39 mmHg. Asymptomatic. EKG shows sinus bradycardia with inferior Q waves. Murmur late peaking, prominent. - Order repeat echocardiogram to assess pressure gradients. - Schedule follow-up in six months or sooner based on echocardiogram results. - Discuss surgical options if pressure gradients indicate severe stenosis. We discussed this will not get better but timing  is difficult.  - Advise smoking cessation to improve surgical outcomes.   HTN - Continue current regimen   HLD - statin   Tobacco use disorder Currently smokes less than five cigarettes a day. Smoking cessation crucial for health and surgical outcomes. - Advise smoking cessation.              Follow-up: Return in about 6 months (around 12/12/2024).   Signed, Darryle DASEN. Barbaraann, MD, [facility]  [facility]  [address] [place], [place] [address] [phone]  4:36 PM

## 2024-06-13 ENCOUNTER — Ambulatory Visit: Payer: Self-pay | Attending: Cardiovascular Disease | Admitting: Cardiovascular Disease

## 2024-06-13 ENCOUNTER — Encounter: Payer: Self-pay | Admitting: Cardiovascular Disease

## 2024-06-13 VITALS — BP 112/74 | HR 59 | Ht 73.5 in | Wt 225.2 lb

## 2024-06-13 DIAGNOSIS — Z720 Tobacco use: Secondary | ICD-10-CM

## 2024-06-13 DIAGNOSIS — I350 Nonrheumatic aortic (valve) stenosis: Secondary | ICD-10-CM | POA: Diagnosis not present

## 2024-06-13 DIAGNOSIS — I150 Renovascular hypertension: Secondary | ICD-10-CM | POA: Diagnosis not present

## 2024-06-13 DIAGNOSIS — E782 Mixed hyperlipidemia: Secondary | ICD-10-CM | POA: Diagnosis not present

## 2024-06-13 NOTE — Patient Instructions (Signed)
 Medication Instructions:  Your physician recommends that you continue on your current medications as directed. Please refer to the Current Medication list given to you today.  *If you need a refill on your cardiac medications before your next appointment, please call your pharmacy*  Lab Work: NONE If you have labs (blood work) drawn today and your tests are completely normal, you will receive your results only by: MyChart Message (if you have MyChart) OR A paper copy in the mail If you have any lab test that is abnormal or we need to change your treatment, we will call you to review the results.  Testing/Procedures: ECHO  Your physician has requested that you have an echocardiogram. Echocardiography is a painless test that uses sound waves to create images of your heart. It provides your doctor with information about the size and shape of your heart and how well your heart's chambers and valves are working. This procedure takes approximately one hour. There are no restrictions for this procedure. Please do NOT wear cologne, perfume, aftershave, or lotions (deodorant is allowed). Please arrive 15 minutes prior to your appointment time.  Please note: We ask at that you not bring children with you during ultrasound (echo/ vascular) testing. Due to room size and safety concerns, children are not allowed in the ultrasound rooms during exams. Our front office staff cannot provide observation of children in our lobby area while testing is being conducted. An adult accompanying a patient to their appointment will only be allowed in the ultrasound room at the discretion of the ultrasound technician under special circumstances. We apologize for any inconvenience.   Follow-Up: At [facility], you and your health needs are our priority.  As part of our continuing mission to provide you with exceptional heart care, our providers are all part of one team.  This team includes your primary  Cardiologist (physician) and Advanced Practice Providers or APPs (Physician Assistants and Nurse Practitioners) who all work together to provide you with the care you need, when you need it.  Your next appointment:   6 MONTH  Provider:   Dr. MALVATHEORA Mulch  We recommend signing up for the patient portal called MyChart.  Sign up information is provided on this After Visit Summary.  MyChart is used to connect with patients for Virtual Visits (Telemedicine).  Patients are able to view lab/test results, encounter notes, upcoming appointments, etc.  Non-urgent messages can be sent to your provider as well.   To learn more about what you can do with MyChart, go to [email or web address].   Other Instructions none

## 2024-06-25 ENCOUNTER — Other Ambulatory Visit: Payer: Self-pay | Admitting: Family Medicine

## 2024-06-25 DIAGNOSIS — I10 Essential (primary) hypertension: Secondary | ICD-10-CM

## 2024-06-25 DIAGNOSIS — E785 Hyperlipidemia, unspecified: Secondary | ICD-10-CM

## 2024-07-01 ENCOUNTER — Encounter: Payer: BC Managed Care – PPO | Admitting: Family Medicine

## 2024-07-23 ENCOUNTER — Encounter (HOSPITAL_COMMUNITY): Payer: Self-pay

## 2024-07-23 ENCOUNTER — Ambulatory Visit: Payer: Self-pay | Admitting: Cardiovascular Disease

## 2024-07-23 ENCOUNTER — Ambulatory Visit (HOSPITAL_COMMUNITY)
Admission: RE | Admit: 2024-07-23 | Discharge: 2024-07-23 | Disposition: A | Discharge status: Home / Self Care | Source: Ambulatory Visit | Attending: Internal Medicine | Admitting: Internal Medicine

## 2024-07-23 DIAGNOSIS — I350 Nonrheumatic aortic (valve) stenosis: Secondary | ICD-10-CM

## 2024-07-23 LAB — ECHOCARDIOGRAM COMPLETE
AR max vel: 0.79 cm2
AV Area VTI: 0.83 cm2
AV Area mean vel: 0.75 cm2
AV Mean grad: 54 mmHg
AV Peak grad: 88.7 mmHg
Ao pk vel: 4.71 m/s
Area-P 1/2: 2.43 cm2
S' Lateral: 3.24 cm

## 2024-07-23 NOTE — Telephone Encounter (Signed)
 Called Mr. Shooting.  Echocardiogram shows very severe aortic stenosis.  He has no symptoms of chest pain or trouble breathing but we discussed he needs to proceed to likely surgical intervention.  We will set him up for cardiac CT as well as CTA chest.  Hopefully we can exclude obstructive CAD on the scan.  I will set him up for carotid ultrasounds.  Heart rate 59 no beta-blocker needed.  He will come in for a BMP prior to the scan.  We will try to get this done in the next 1 to 2 weeks.  He would likely just be a candidate for surgery.  We will proceed with a retrospective heart CT and see if we can exclude CAD on the scan.  If he has any chest pain or trouble breathing he will let me know.  We will initiate the referral to CT surgery as well.  Signed, Darryle DASEN. Barbaraann, MD, [facility]  [facility]  [address] [place], [place] [address] [phone]  11:47 AM

## 2024-07-24 LAB — BASIC METABOLIC PANEL WITH GFR
BUN/Creatinine Ratio: 27 — ABNORMAL HIGH (ref 10–24)
BUN: 20 mg/dL (ref 8–27)
CO2: 23 mmol/L (ref 20–29)
Calcium: 10 mg/dL (ref 8.6–10.2)
Chloride: 99 mmol/L (ref 96–106)
Creatinine, Ser: 0.75 mg/dL — ABNORMAL LOW (ref 0.76–1.27)
Glucose: 94 mg/dL (ref 70–99)
Potassium: 4.2 mmol/L (ref 3.5–5.2)
Sodium: 136 mmol/L (ref 134–144)
eGFR: 103 mL/min/1.73 (ref >59)

## 2024-07-25 ENCOUNTER — Ambulatory Visit (HOSPITAL_COMMUNITY)
Admission: RE | Admit: 2024-07-25 | Discharge: 2024-07-25 | Disposition: A | Discharge status: Home / Self Care | Source: Ambulatory Visit | Attending: Cardiovascular Disease | Admitting: Cardiovascular Disease

## 2024-07-25 DIAGNOSIS — I350 Nonrheumatic aortic (valve) stenosis: Secondary | ICD-10-CM | POA: Insufficient documentation

## 2024-07-25 MED ORDER — IOHEXOL 350 MG/ML SOLN
100.0000 mL | Freq: Once | INTRAVENOUS | Status: AC | PRN
  Administered 2024-07-25: 100 mL via INTRAVENOUS

## 2024-07-28 NOTE — Progress Notes (Unsigned)
 Cardiology Office Note:  .   Date:  07/29/2024  ID:  Chris Skinner, DOB [birth year], MRN [id number] PCP: [name], MD  [facility] Providers Cardiologist:  Darryle ONEIDA Decent, MD {  History of Present Illness: .    Chief Complaint  Patient presents with   Follow-up         Chris Skinner is a 58 y.o. male with history of CAD, aortic stenosis who presents for follow-up.    History of Present Illness   Chris Skinner is a 58 year old male with coronary artery disease and very severe aortic stenosis who presents for follow-up. He is accompanied by his son.  He has a history of coronary artery disease and very severe aortic stenosis. A recent echocardiogram showed very severe aortic stenosis, and a CT scan showed concerns for a chronic total occlusion of the right coronary artery. No symptoms such as chest pain, trouble breathing, or dizziness.  He has diabetes, with a recent A1c of 6.6, indicating well-controlled diabetes.  He has hyperlipidemia and is currently taking rosuvastatin  40 mg and Zetia  10 mg. He is also participating in a drug study for a cholesterol medication by [employer], which is an oral form of a PCSK9 inhibitor. He is unsure if he is receiving the active medication or a placebo.  He has a history of tobacco use and is currently in the process of quitting smoking.          Problem List Bicuspid aortic valve Severe aortic stenosis  -Vmax 4.7 m/s, MG 54 mmHG 3. HTN 4. HLD -T chol 175, HDL 45, LDL 99, TG 178 5. DM -A1c 6.6 6. CAD -CAC 1536 (98th percentile) -Mild LAD/LCX 25-49% -CTO dRCA    ROS: All other ROS reviewed and negative. Pertinent positives noted in the HPI.     Studies Reviewed: SABRA   EKG Interpretation Date/Time:  Tuesday July 29 2024 08:32:28 EST Ventricular Rate:  63 PR Interval:  182 QRS Duration:  106 QT Interval:  464 QTC Calculation: 474 R Axis:   -6  Text Interpretation: Normal sinus rhythm Incomplete right  bundle branch block Inferior infarct (cited on or before 13-Jun-2024) Confirmed by Decent Darryle [phone]) on 07/29/2024 8:34:31 AM   TTE 07/23/2024  1. Left ventricular ejection fraction, by estimation, is 60 to 65%. The  left ventricle has normal function. The left ventricle has no regional  wall motion abnormalities. There is mild left ventricular hypertrophy. The  average left ventricular global  longitudinal strain is -19.2 %. The global longitudinal strain is normal.   2. Right ventricular systolic function is normal. The right ventricular  size is normal.   3. The mitral valve is degenerative. No evidence of mitral valve  regurgitation. No evidence of mitral stenosis. Severe mitral annular  calcification.   4. DVI 0.26. The aortic valve is bicuspid. There is severe calcifcation  of the aortic valve. Aortic valve regurgitation is mild. Severe aortic  valve stenosis. Aortic valve area, by VTI measures 0.83 cm. Aortic valve  mean gradient measures 54.0 mmHg.  Aortic valve Vmax measures 4.71 m/s.   5. The inferior vena cava is normal in size with greater than 50%  respiratory variability, suggesting right atrial pressure of 3 mmHg.  Physical Exam:   VS:  BP 112/72 (BP Location: Left Arm, Patient Position: Sitting, Cuff Size: Large)   Pulse 63   Ht 6' 1.5 (1.867 m)   Wt 223 lb (101.2 kg)  SpO2 95%   BMI 29.02 kg/m    Wt Readings from Last 3 Encounters:  07/29/24 223 lb (101.2 kg)  06/13/24 225 lb 3.2 oz (102.2 kg)  03/25/24 223 lb (101.2 kg)    GEN: Well nourished, well developed in no acute distress NECK: No JVD; No carotid bruits CARDIAC: RRR, 3/6 SEM, late peaking, absent S2, rubs, gallops RESPIRATORY:  Clear to auscultation without rales, wheezing or rhonchi  ABDOMEN: Soft, non-tender, non-distended EXTREMITIES:  No edema; No deformity  ASSESSMENT AND PLAN: .   Assessment and Plan    Very severe aortic stenosis Requiring surgical intervention. Immediate intervention  necessary to prevent heart failure and complications. Explained risks of delaying surgery. - Scheduled preoperative left heart catheterization for November 25th, 2025. - Referred to cardiac surgery for evaluation and potential surgical aortic valve replacement. - Advised smoking cessation to improve surgical outcomes.  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 Preoperative evaluation needed to determine necessity of bypass surgery during valve replacement. - Scheduled preoperative left heart catheterization to assess coronary anatomy. - Will discuss findings with cardiac surgery team to determine need for bypass surgery. - Start ASA 81 mg daily. We will get lipids controlled after surgery  Type 2 diabetes mellitus, well controlled Well controlled with an A1c of 6.6%.  Hyperlipidemia Managed with rosuvastatin  and Zetia . Participating in a research trial for a PCSK9 inhibitor post-surgery. - Continue current lipid-lowering therapy. - Will consider Repatha post-surgery based on research trial results.  Tobacco use Continued use poses risk for poor surgical outcomes. Advised to quit smoking immediately. - Advised smoking cessation. 3 min smoking cessation counseling provided in office          Informed Consent   Shared Decision Making/Informed Consent The risks [stroke (1 in 1000), death (1 in 1000), kidney failure [usually temporary] (1 in 500), bleeding (1 in 200), allergic reaction [possibly serious] (1 in 200)], benefits (diagnostic support and management of coronary artery disease) and alternatives of a cardiac catheterization were discussed in detail with Mr. Jeansonne and he is willing to proceed.      Follow-up: Return in about 6 months (around 01/26/2025).  Signed, Darryle DASEN. Barbaraann, MD, [facility]  [facility]  [address] [place], [place] [address] [phone]  9:06  AM

## 2024-07-28 NOTE — Telephone Encounter (Signed)
 Called Mr. Brunetto.  We discussed his coronary CTA showed concern for CTO of the RCA he will come tomorrow for preoperative cardiac cath evaluation.  No symptoms of chest pain or trouble breathing.  Signed, Darryle DASEN. Barbaraann, MD, [facility]  [facility]  [address] [place], [place] [address] [phone]  9:54 AM

## 2024-07-28 NOTE — Telephone Encounter (Signed)
 Attempted to call Mr. Lauder regarding the results of the CT scan.  Concerns for chronic total occlusion of the RCA.  He will need a left heart catheterization for preprocedure assessment.  I did leave a voicemail for him to call us  back.  He will need to see me this week to get set up for a cardiac cath.  Signed, Darryle DASEN. Barbaraann, MD, [facility]  [facility]  [address] [place], [place] [address] [phone]  8:12 AM

## 2024-07-28 NOTE — H&P (View-Only) (Signed)
 Cardiology Office Note:  .   Date:  07/29/2024  ID:  Chris Skinner, DOB [birth year], MRN [id number] PCP: Chris Norleen BROCKS, MD  [facility] Providers Cardiologist:  Darryle ONEIDA Decent, MD {  History of Present Illness: .    Chief Complaint  Patient presents with   Follow-up         Chris Skinner is a 58 y.o. male with history of CAD, aortic stenosis who presents for follow-up.    History of Present Illness   Chris Skinner is a 58 year old male with coronary artery disease and very severe aortic stenosis who presents for follow-up. He is accompanied by his son.  He has a history of coronary artery disease and very severe aortic stenosis. A recent echocardiogram showed very severe aortic stenosis, and a CT scan showed concerns for a chronic total occlusion of the right coronary artery. No symptoms such as chest pain, trouble breathing, or dizziness.  He has diabetes, with a recent A1c of 6.6, indicating well-controlled diabetes.  He has hyperlipidemia and is currently taking rosuvastatin  40 mg and Zetia  10 mg. He is also participating in a drug study for a cholesterol medication by [employer], which is an oral form of a PCSK9 inhibitor. He is unsure if he is receiving the active medication or a placebo.  He has a history of tobacco use and is currently in the process of quitting smoking.          Problem List Bicuspid aortic valve Severe aortic stenosis  -Vmax 4.7 m/s, MG 54 mmHG 3. HTN 4. HLD -T chol 175, HDL 45, LDL 99, TG 178 5. DM -A1c 6.6 6. CAD -CAC 1536 (98th percentile) -Mild LAD/LCX 25-49% -CTO dRCA    ROS: All other ROS reviewed and negative. Pertinent positives noted in the HPI.     Studies Reviewed: SABRA   EKG Interpretation Date/Time:  Tuesday July 29 2024 08:32:28 EST Ventricular Rate:  63 PR Interval:  182 QRS Duration:  106 QT Interval:  464 QTC Calculation: 474 R Axis:   -6  Text Interpretation: Normal sinus rhythm Incomplete right  bundle branch block Inferior infarct (cited on or before 13-Jun-2024) Confirmed by Decent Darryle [phone]) on 07/29/2024 8:34:31 AM   TTE 07/23/2024  1. Left ventricular ejection fraction, by estimation, is 60 to 65%. The  left ventricle has normal function. The left ventricle has no regional  wall motion abnormalities. There is mild left ventricular hypertrophy. The  average left ventricular global  longitudinal strain is -19.2 %. The global longitudinal strain is normal.   2. Right ventricular systolic function is normal. The right ventricular  size is normal.   3. The mitral valve is degenerative. No evidence of mitral valve  regurgitation. No evidence of mitral stenosis. Severe mitral annular  calcification.   4. DVI 0.26. The aortic valve is bicuspid. There is severe calcifcation  of the aortic valve. Aortic valve regurgitation is mild. Severe aortic  valve stenosis. Aortic valve area, by VTI measures 0.83 cm. Aortic valve  mean gradient measures 54.0 mmHg.  Aortic valve Vmax measures 4.71 m/s.   5. The inferior vena cava is normal in size with greater than 50%  respiratory variability, suggesting right atrial pressure of 3 mmHg.  Physical Exam:   VS:  BP 112/72 (BP Location: Left Arm, Patient Position: Sitting, Cuff Size: Large)   Pulse 63   Ht 6' 1.5 (1.867 m)   Wt 223 lb (101.2 kg)  SpO2 95%   BMI 29.02 kg/m    Wt Readings from Last 3 Encounters:  07/29/24 223 lb (101.2 kg)  06/13/24 225 lb 3.2 oz (102.2 kg)  03/25/24 223 lb (101.2 kg)    GEN: Well nourished, well developed in no acute distress NECK: No JVD; No carotid bruits CARDIAC: RRR, 3/6 SEM, late peaking, absent S2, rubs, gallops RESPIRATORY:  Clear to auscultation without rales, wheezing or rhonchi  ABDOMEN: Soft, non-tender, non-distended EXTREMITIES:  No edema; No deformity  ASSESSMENT AND PLAN: .   Assessment and Plan    Very severe aortic stenosis Requiring surgical intervention. Immediate intervention  necessary to prevent heart failure and complications. Explained risks of delaying surgery. - Scheduled preoperative left heart catheterization for November 25th, 2025. - Referred to cardiac surgery for evaluation and potential surgical aortic valve replacement. - Advised smoking cessation to improve surgical outcomes.  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 Preoperative evaluation needed to determine necessity of bypass surgery during valve replacement. - Scheduled preoperative left heart catheterization to assess coronary anatomy. - Will discuss findings with cardiac surgery team to determine need for bypass surgery. - Start ASA 81 mg daily. We will get lipids controlled after surgery  Type 2 diabetes mellitus, well controlled Well controlled with an A1c of 6.6%.  Hyperlipidemia Managed with rosuvastatin  and Zetia . Participating in a research trial for a PCSK9 inhibitor post-surgery. - Continue current lipid-lowering therapy. - Will consider Repatha post-surgery based on research trial results.  Tobacco use Continued use poses risk for poor surgical outcomes. Advised to quit smoking immediately. - Advised smoking cessation. 3 min smoking cessation counseling provided in office          Informed Consent   Shared Decision Making/Informed Consent The risks [stroke (1 in 1000), death (1 in 1000), kidney failure [usually temporary] (1 in 500), bleeding (1 in 200), allergic reaction [possibly serious] (1 in 200)], benefits (diagnostic support and management of coronary artery disease) and alternatives of a cardiac catheterization were discussed in detail with Mr. Jeansonne and he is willing to proceed.      Follow-up: Return in about 6 months (around 01/26/2025).  Signed, Darryle DASEN. Barbaraann, MD, [facility]  [facility]  [address] [place], [place] [address] [phone]  9:06  AM

## 2024-07-28 NOTE — Telephone Encounter (Signed)
Pt returning call for results. Please advise.  

## 2024-07-29 ENCOUNTER — Encounter: Payer: Self-pay | Admitting: Cardiovascular Disease

## 2024-07-29 ENCOUNTER — Telehealth: Payer: Self-pay

## 2024-07-29 ENCOUNTER — Ambulatory Visit: Attending: Cardiovascular Disease | Admitting: Cardiovascular Disease

## 2024-07-29 VITALS — BP 112/72 | HR 63 | Ht 73.5 in | Wt 223.0 lb

## 2024-07-29 DIAGNOSIS — I2510 Atherosclerotic heart disease of native coronary artery without angina pectoris: Secondary | ICD-10-CM

## 2024-07-29 DIAGNOSIS — I150 Renovascular hypertension: Secondary | ICD-10-CM

## 2024-07-29 DIAGNOSIS — E782 Mixed hyperlipidemia: Secondary | ICD-10-CM

## 2024-07-29 DIAGNOSIS — F17210 Nicotine dependence, cigarettes, uncomplicated: Secondary | ICD-10-CM

## 2024-07-29 DIAGNOSIS — I350 Nonrheumatic aortic (valve) stenosis: Secondary | ICD-10-CM | POA: Diagnosis not present

## 2024-07-29 DIAGNOSIS — Z720 Tobacco use: Secondary | ICD-10-CM

## 2024-07-29 MED ORDER — ASPIRIN 81 MG PO TBEC: 81.0000 mg | DELAYED_RELEASE_TABLET | Freq: Every day | ORAL | Status: AC

## 2024-07-29 NOTE — Telephone Encounter (Signed)
 I called the patient to go over the cath instructions with him again. I wanted to make sure that he knows not to restart the diabetes mediation until 48 hours after the procedure.   [place] [facility] A DEPT OF [place]. [facility] AT [address] A DEPT OF THE [place]. [facility] [address] [place] [address] Dept: [phone] Loc: [phone]  Chris Skinner  07/29/2024  You are scheduled for a Cardiac Catheterization on Tuesday, November 25 with Dr. Lurena Red.  1. Please arrive at the [facility] (Main Entrance A) at [facility]: [address] [place], [place] [address] at 7:00 AM (This time is 2 hour(s) before your procedure to ensure your preparation).   Free valet parking service is available. You will check in at ADMITTING. The support person will be asked to wait in the waiting room.  It is OK to have someone drop you off and come back when you are ready to be discharged.    Special note: Every effort is made to have your procedure done on time. Please understand that emergencies sometimes delay scheduled procedures.  2. Diet: Nothing to eat after midnight.   3. Hydration: You need to be well hydrated before your procedure. On November 25, you may drink approved liquids (see below) until 2 hours before the procedure, with 16 oz of water as your last intake.   List of approved liquids water, clear juice, clear tea, black coffee, fruit juices, non-citric and without pulp, carbonated beverages, Gatorade, Kool -Aid, plain Jello-O and plain ice popsicles.  4. Labs: You will need to have blood drawn on , November 18 at [facility] (1st Floor), [address], [place], [place] [address]. You do not need to be fasting.  5. Medication instructions in preparation for your procedure:   Contrast Allergy: No     Stop taking, Lisinopril  / hydrochlorothiazide  24 hours before procedure.   Stop  taking FarxigaFriday, November 21, resume medication 48 hours after procedure.   On the morning of your procedure, take your Aspirin  81 mg and any morning medicines NOT listed above.  You may use sips of water.  6. Plan to go home the same day, you will only stay overnight if medically necessary. 7. Bring a current list of your medications and current insurance cards. 8. You MUST have a responsible person to drive you home. 9. Someone MUST be with you the first 24 hours after you arrive home or your discharge will be delayed. 10. Please wear clothes that are easy to get on and off and wear slip-on shoes.  Thank you for allowing us  to care for you!   -- [facility]  ith the care you need, when you need it.

## 2024-07-29 NOTE — Patient Instructions (Addendum)
 Medication Instructions:  Start: aspirin  81mg  daily *If you need a refill on your cardiac medications before your next appointment, please call your pharmacy*  Lab Work: Today: CBC If you have labs (blood work) drawn today and your tests are completely normal, you will receive your results only by: MyChart Message (if you have MyChart) OR A paper copy in the mail If you have any lab test that is abnormal or we need to change your treatment, we will call you to review the results.  Testing/Procedures: Carotid Ultrasound  Your physician has requested that you have a carotid duplex. This test is an ultrasound of the carotid arteries in your neck. It looks at blood flow through these arteries that supply the brain with blood. Allow one hour for this exam. There are no restrictions or special instructions.  Left Heart Cath  [place] [facility] A DEPT OF [place]. [facility] HEARTCARE AT [address] A DEPT OF THE [place]. [facility] [address] [place] [address] Dept: [phone] Loc: [phone]  ROBLEY MATASSA  07/29/2024  You are scheduled for a Cardiac Catheterization on Tuesday, November 25 with Dr. Lurena Red.  1. Please arrive at the [facility] (Main Entrance A) at [facility]: [address] [place], [place] [address] at 7:00 AM (This time is 2 hour(s) before your procedure to ensure your preparation).   Free valet parking service is available. You will check in at ADMITTING. The support person will be asked to wait in the waiting room.  It is OK to have someone drop you off and come back when you are ready to be discharged.    Special note: Every effort is made to have your procedure done on time. Please understand that emergencies sometimes delay scheduled procedures.  2. Diet: Nothing to eat after midnight.   3. Hydration: You need to be well hydrated before your procedure. On November 25, you may drink approved liquids (see below) until 2  hours before the procedure, with 16 oz of water as your last intake.   List of approved liquids water, clear juice, clear tea, black coffee, fruit juices, non-citric and without pulp, carbonated beverages, Gatorade, Kool -Aid, plain Jello-O and plain ice popsicles.  4. Labs: You will need to have blood drawn on , November 18 at [facility] (1st Floor), [address], [place], [place] [address]. You do not need to be fasting.  5. Medication instructions in preparation for your procedure:   Contrast Allergy: No     Stop taking, Lisinopril  / hydrochlorothiazide  24 hours before procedure.   Stop taking FarxigaFriday, November 21, resume medication 48 hours after procedure.   On the morning of your procedure, take your Aspirin  81 mg and any morning medicines NOT listed above.  You may use sips of water.  6. Plan to go home the same day, you will only stay overnight if medically necessary. 7. Bring a current list of your medications and current insurance cards. 8. You MUST have a responsible person to drive you home. 9. Someone MUST be with you the first 24 hours after you arrive home or your discharge will be delayed. 10. Please wear clothes that are easy to get on and off and wear slip-on shoes.  Thank you for allowing us  to care for you!   -- [facility]   Follow-Up: At [facility], you and your health  needs are our priority.  As part of our continuing mission to provide you with exceptional heart care, our providers are all part of one team.  This team includes your primary Cardiologist (physician) and Advanced Practice Providers or APPs (Physician Assistants and Nurse Practitioners) who all work together to provide you with the care you need, when you need it.  Your next appointment:   6 month(s)  Provider:   Darryle ONEIDA Decent, MD    We recommend signing up for the patient portal called  MyChart.  Sign up information is provided on this After Visit Summary.  MyChart is used to connect with patients for Virtual Visits (Telemedicine).  Patients are able to view lab/test results, encounter notes, upcoming appointments, etc.  Non-urgent messages can be sent to your provider as well.   To learn more about what you can do with MyChart, go to [email or web address].   Other Instructions

## 2024-07-30 ENCOUNTER — Ambulatory Visit: Payer: Self-pay | Admitting: Cardiovascular Disease

## 2024-07-30 LAB — CBC
Hematocrit: 49.6 % (ref 37.5–51.0)
Hemoglobin: 16.8 g/dL (ref 13.0–17.7)
MCH: 31.3 pg (ref 26.6–33.0)
MCHC: 33.9 g/dL (ref 31.5–35.7)
MCV: 92 fL (ref 79–97)
Platelets: 354 x10E3/uL (ref 150–450)
RBC: 5.37 x10E6/uL (ref 4.14–5.80)
RDW: 11.8 % (ref 11.6–15.4)
WBC: 11.9 x10E3/uL — ABNORMAL HIGH (ref 3.4–10.8)

## 2024-07-31 ENCOUNTER — Ambulatory Visit (HOSPITAL_COMMUNITY)
Admission: RE | Admit: 2024-07-31 | Discharge: 2024-07-31 | Disposition: A | Discharge status: Home / Self Care | Source: Ambulatory Visit | Attending: Cardiovascular Disease | Admitting: Cardiovascular Disease

## 2024-07-31 DIAGNOSIS — I350 Nonrheumatic aortic (valve) stenosis: Secondary | ICD-10-CM | POA: Insufficient documentation

## 2024-07-31 DIAGNOSIS — Z01810 Encounter for preprocedural cardiovascular examination: Secondary | ICD-10-CM | POA: Insufficient documentation

## 2024-08-03 NOTE — Progress Notes (Unsigned)
 [address] [place] [address]             [phone]    Chris Skinner [facility] Medical Record #[id number] Date of Birth: 19-Jul-1966  Referring: [name], * Primary Care: [name], MD Primary Cardiologist:[name] Barbaraann, MD  Chief Complaint:   No chief complaint on file.   History of Present Illness:     Chris Skinner is a 58 y.o. male who presents for surgical evaluation of severe aortic stenosis and coronary artery disease.  He has a history of HL and ?active tobacco use.  He is enrolled in a drug study for a cholesterol medication by [employer] and is unsure if he's on the experimental medication or placebo.     LHC (08/05/24): TBD RHC (08/05/24): TBD TTE (07/23/24): LVEF 60-65%, Normal RV function.  Bicuspid aortic valve with mild AI, severe AS (MG 54, AVA 0.83, Vmax 4.71), no MR, no MS, severe MAC Chest CT (07/27/24): CTO of RCA, no significant aortic calcium  PFT (***): *** Carotid US  (07/31/24): Right: 1-39%, Left: 1-39%  Past Medical and Surgical History: Previous Chest Surgery: *** Previous Chest Radiation: *** Diabetes Mellitus: Yes.  HbA1C 6.6% (03/25/24) Anticoagulation: No, Last dose N/A  Creatinine:  Lab Results  Component Value Date   CREATININE 0.75 (L) 07/23/2024   CREATININE 0.77 05/23/2023   CREATININE 0.80 05/17/2022     Past Medical History:  Diagnosis Date   Chest pain 2001   [place], normal cardiac workup   Diabetes mellitus without complication (HCC)    Dyslipidemia    H/O echocardiogram 09/2013   PFO found incidentally.  Echo done due to murmur   Heart murmur    mild aortic stenosis   Hyperlipidemia    Hypertension    Personal history of colonic polyp - adenoma 01/05/2014   Sleep apnea    cpap   Tobacco use disorder    Wears glasses     Past Surgical History:  Procedure Laterality Date   BIOPSY  01/17/2021   Procedure: BIOPSY;  Surgeon: Aneita Gwendlyn ONEIDA, MD;  Location: [facility]  ENDOSCOPY;  Service: Endoscopy;;   COLONOSCOPY  2015   ESOPHAGOGASTRODUODENOSCOPY (EGD) WITH PROPOFOL  N/A 01/17/2021   Procedure: ESOPHAGOGASTRODUODENOSCOPY (EGD) WITH PROPOFOL ;  Surgeon: Aneita Gwendlyn ONEIDA, MD;  Location: [facility] ENDOSCOPY;  Service: Endoscopy;  Laterality: N/A;   FOREIGN BODY REMOVAL  01/17/2021   Procedure: FOREIGN BODY REMOVAL;  Surgeon: Aneita Gwendlyn ONEIDA, MD;  Location: [facility] ENDOSCOPY;  Service: Endoscopy;;   KNEE ARTHROSCOPY Left 1985   TONSILLECTOMY AND ADENOIDECTOMY  1974    Social History:  Social History   Tobacco Use  Smoking Status Every Day  Smokeless Tobacco Former   Types: Chew   Quit date: 2009    Social History   Substance and Sexual Activity  Alcohol Use Yes   Alcohol/week: 10.0 standard drinks of alcohol   Types: 10 Cans of beer per week     No Known Allergies  Medications: Asprin: *** Statin: *** Beta Blocker: *** Ace Inhibitor: *** Anti-Coagulation: ***  Current Outpatient Medications  Medication Sig Dispense Refill   amLODipine  (NORVASC ) 5 MG tablet TAKE 1 TABLET (5 MG TOTAL) BY MOUTH DAILY. 90 tablet 3   aspirin  EC 81 MG tablet Take 1 tablet (81 mg total) by mouth daily. Swallow whole.     cetirizine (ZYRTEC) 10 MG chewable tablet Chew 10 mg by mouth daily.     dapagliflozin   propanediol (FARXIGA ) 5 MG TABS tablet Take 1 tablet (5 mg total) by mouth daily before breakfast. 90 tablet 0   ezetimibe  (ZETIA ) 10 MG tablet TAKE 1 TABLET BY MOUTH EVERY DAY 90 tablet 3   lisinopril -hydrochlorothiazide  (ZESTORETIC ) 20-12.5 MG tablet TAKE 1 TABLET BY MOUTH EVERY DAY 90 tablet 3   Multiple Vitamins-Minerals (MULTIVITAMIN WITH MINERALS) tablet Take 1 tablet by mouth daily.     Naproxen Sodium 220 MG CAPS Take 220 mg by mouth daily.     PRESCRIPTION MEDICATION Take 30 mg by mouth daily. Study Drug with [employer] Cholesterol Drug     rosuvastatin  (CRESTOR ) 40 MG tablet TAKE 1 TABLET BY MOUTH EVERY DAY 90 tablet 3   No current facility-administered  medications for this visit.    (Not in a hospital admission)   Family History  Problem Relation Age of Onset   Hyperlipidemia Mother    Heart disease Mother    Arthritis Mother    Stroke Mother    Heart disease Father    Diabetes Father        borderline   Crohn's disease Sister    Diabetes Paternal Grandfather    Cancer Neg Hx    Colon cancer Neg Hx    Colon polyps Neg Hx    Esophageal cancer Neg Hx    Stomach cancer Neg Hx    Rectal cancer Neg Hx      Review of Systems:   ROS    Physical Exam: There were no vitals taken for this visit. Physical Exam    Diagnostic Studies & Laboratory data: Cardiac Studies & Procedures   ______________________________________________________________________________________________     ECHOCARDIOGRAM  ECHOCARDIOGRAM COMPLETE 07/23/2024  Narrative ECHOCARDIOGRAM REPORT    Patient Name:   Chris Skinner Date of Exam: 07/23/2024 Medical Rec #:  [id number]      Height:       73.5 in Accession #:    [id number]     Weight:       225.2 lb Date of Birth:  1966/02/23      BSA:          2.274 m Patient Age:    58 years       BP:           117/83 mmHg Patient Gender: M              HR:           59 bpm. Exam Location:  [address]  Procedure: 2D Echo, 3D Echo and Strain Analysis (Both Spectral and Color Flow Doppler were utilized during procedure).  Indications:    I35.0 Nonrheumatic aortic (valve) stenosis  History:        Patient has prior history of Echocardiogram examinations, most recent 01/14/2020. CAD, COPD; Risk Factors:Hypertension, Diabetes, Dyslipidemia, Family History of Coronary Artery Disease, Current Smoker and Sleep Apnea. Bicuspid aortic valve.  Sonographer:    Jon Hacker RCS Referring Phys: [id number] [name]  IMPRESSIONS   1. Left ventricular ejection fraction, by estimation, is 60 to 65%. The left ventricle has normal function. The left ventricle has no regional wall motion  abnormalities. There is mild left ventricular hypertrophy. The average left ventricular global longitudinal strain is -19.2 %. The global longitudinal strain is normal. 2. Right ventricular systolic function is normal. The right ventricular size is normal. 3. The mitral valve is degenerative. No evidence of mitral valve regurgitation. No evidence of mitral stenosis. Severe mitral annular calcification. 4. DVI 0.26. The  aortic valve is bicuspid. There is severe calcifcation of the aortic valve. Aortic valve regurgitation is mild. Severe aortic valve stenosis. Aortic valve area, by VTI measures 0.83 cm. Aortic valve mean gradient measures 54.0 mmHg. Aortic valve Vmax measures 4.71 m/s. 5. The inferior vena cava is normal in size with greater than 50% respiratory variability, suggesting right atrial pressure of 3 mmHg.  Comparison(s): Changes from prior study are noted. Aortic stenosis has increased to very severe (PV 4.7, MG 54, and AVA 0.83) and there is now mild regurgitation.  FINDINGS Left Ventricle: Left ventricular ejection fraction, by estimation, is 60 to 65%. The left ventricle has normal function. The left ventricle has no regional wall motion abnormalities. The average left ventricular global longitudinal strain is -19.2 %. Strain was performed and the global longitudinal strain is normal. The left ventricular internal cavity size was normal in size. There is mild left ventricular hypertrophy. Left ventricular diastolic function could not be evaluated due to mitral annular calcification (moderate or greater).  Right Ventricle: The right ventricular size is normal. No increase in right ventricular wall thickness. Right ventricular systolic function is normal.  Left Atrium: Left atrial size was normal in size.  Right Atrium: Right atrial size was normal in size.  Pericardium: There is no evidence of pericardial effusion.  Mitral Valve: The mitral valve is degenerative in appearance.  Severe mitral annular calcification. No evidence of mitral valve regurgitation. No evidence of mitral valve stenosis. The mean mitral valve gradient is 4.5 mmHg with average heart rate of 58 bpm.  Tricuspid Valve: The tricuspid valve is normal in structure. Tricuspid valve regurgitation is mild . No evidence of tricuspid stenosis.  Aortic Valve: DVI 0.26. The aortic valve is bicuspid. There is severe calcifcation of the aortic valve. Aortic valve regurgitation is mild. Severe aortic stenosis is present. Aortic valve mean gradient measures 54.0 mmHg. Aortic valve peak gradient measures 88.7 mmHg. Aortic valve area, by VTI measures 0.83 cm.  Pulmonic Valve: The pulmonic valve was normal in structure. Pulmonic valve regurgitation is not visualized. No evidence of pulmonic stenosis.  Aorta: The aortic root is normal in size and structure.  Venous: The inferior vena cava is normal in size with greater than 50% respiratory variability, suggesting right atrial pressure of 3 mmHg.  IAS/Shunts: No atrial level shunt detected by color flow Doppler.  Additional Comments: 3D was performed not requiring image post processing on an independent workstation and was abnormal.   LEFT VENTRICLE PLAX 2D LVIDd:         4.63 cm   Diastology LVIDs:         3.24 cm   LV e' medial:    8.16 cm/s LV PW:         1.37 cm   LV E/e' medial:  15.7 LV IVS:        1.39 cm   LV e' lateral:   11.20 cm/s LVOT diam:     2.00 cm   LV E/e' lateral: 11.4 LV SV:         97 LV SV Index:   43        2D Longitudinal Strain LVOT Area:     3.14 cm  2D Strain GLS (A4C):   -21.1 % LV IVRT:       56 msec   2D Strain GLS (A3C):   -18.2 % 2D Strain GLS (A2C):   -18.3 % 2D Strain GLS Avg:     -19.2 %  3D Volume  EF: 3D EF:        53 % LV EDV:       198 ml LV ESV:       92 ml LV SV:        106 ml  RIGHT VENTRICLE RV Basal diam:  3.56 cm RV S prime:     12.70 cm/s TAPSE (M-mode): 2.3 cm  LEFT ATRIUM             Index         RIGHT ATRIUM           Index LA diam:        4.10 cm 1.80 cm/m   RA Area:     13.40 cm LA Vol (A2C):   59.1 ml 25.98 ml/m  RA Volume:   25.90 ml  11.39 ml/m LA Vol (A4C):   54.4 ml 23.92 ml/m LA Biplane Vol: 57.3 ml 25.19 ml/m AORTIC VALVE AV Area (Vmax):    0.79 cm AV Area (Vmean):   0.75 cm AV Area (VTI):     0.83 cm AV Vmax:           471.00 cm/s AV Vmean:          343.000 cm/s AV VTI:            1.170 m AV Peak Grad:      88.7 mmHg AV Mean Grad:      54.0 mmHg LVOT Vmax:         119.00 cm/s LVOT Vmean:        81.800 cm/s LVOT VTI:          0.310 m LVOT/AV VTI ratio: 0.26  AORTA Ao Root diam: 3.10 cm Ao Asc diam:  3.60 cm  MITRAL VALVE MV Area (PHT): 2.43 cm     SHUNTS MV Mean grad:  4.5 mmHg     Systemic VTI:  0.31 m MV Decel Time: 312 msec     Systemic Diam: 2.00 cm MV E velocity: 128.00 cm/s MV A velocity: 151.00 cm/s MV E/A ratio:  0.85  Franck Azobou Tonleu Electronically signed by Joelle Cedars Tonleu Signature Date/Time: 07/23/2024/10:31:50 AM    Final      CT SCANS  CT CORONARY MORPH W/CTA COR W/SCORE 07/25/2024  Addendum 07/27/2024 11:19 PM ADDENDUM REPORT: 07/27/2024 23:17  EXAM: OVER-READ INTERPRETATION  CT CHEST  The following report is an over-read performed by radiologist Dr. Suzen Dials of [facility] on 07/27/2024. This over-read does not include interpretation of cardiac or coronary anatomy or pathology. The coronary calcium  score/coronary CTA interpretation by the cardiologist is attached.  COMPARISON:  None.  FINDINGS: Cardiovascular: There are no significant extracardiac vascular findings.  Mediastinum/Nodes: There are no enlarged lymph nodes within the visualized mediastinum.  Lungs/Pleura: There is no pleural effusion. The visualized lungs appear clear.  Upper abdomen: No significant findings in the visualized upper abdomen.  Musculoskeletal/Chest wall: No chest wall mass or suspicious  osseous findings within the visualized chest.  IMPRESSION: No significant extracardiac findings within the visualized chest.   Electronically Signed By: Suzen Dials M.D. On: 07/27/2024 23:17  Narrative CLINICAL DATA:  Severe Aortic Stenosis.  Preoperative assessment.  EXAM: Cardiac TAVR CT  TECHNIQUE: A non-contrast, gated CT scan was obtained with axial slices of 2.5 mm through the heart for aortic valve scoring. A 120 kV retrospective, gated, contrast cardiac scan was obtained. Gantry rotation speed was 230 msec and collimation was 0.63 mm. Nitroglycerin was not given. The  3D dataset was reconstructed in systole with motion correction. The 3D data set was reconstructed in 5% intervals of the 0-95% of the R-R cycle. Systolic and diastolic phases were analyzed on a dedicated workstation using MPR, MIP, and VRT modes. The patient received 100 cc of contrast.  FINDINGS: Image quality: Excellent.  Noise artifact is: Limited.  Valve Morphology: The aortic valve appears tricuspid with partial fusion of the RCC/LCC. Severely restricted leaflet motion in systole. Severe bulky calcification of the NCC.  Aortic Valve Calcium  score: 3520  Aortic annular dimension:  Phase assessed: 26%  Annular area: 499 mm2  Annular perimeter: 81.1 mm  Max diameter: 29.4 mm  Min diameter: 23.5 mm  Annular and subannular calcification: None.  Membranous septum length: 10.2 mm  Optimal coplanar projection: RAO 7 CRA 1  Coronary Artery Height above Annulus:  Left Main: 17.9 mm  Right Coronary: 20.4 mm  Sinus of Valsalva Measurements:  Non-coronary: 33 mm  Right-coronary: 33 mm  Left-coronary: 36 mm  Sinus of Valsalva Height:  Non-coronary: 26.0 mm  Right-coronary: 25.2 mm  Left-coronary: 25.1 mm  Sinotubular Junction: 32 mm  Ascending Thoracic Aorta: 35 mm  Coronary Arteries: Coronary calcium  score of 1536. This was 98th percentile for age-, sex, and  race-matched controls. Normal coronary origin. Right dominance.  Left main: The left main is a large caliber vessel with a normal take off from the left coronary cusp that bifurcates to form a left anterior descending artery and a left circumflex artery. There is minimal calcified plaque (<25%).  Left anterior descending artery: The proximal, mid, and distal LAD segments contains mild calcified plaque (25-49%). D1 contains mild calcified plaque (25-49%). D2 contains mild calcified plaque (25-49%).  Left circumflex artery: The LCX is non-dominant. There is minimal calcified plaque (<25%). OM1 contains mild calcified plaque (25-49%).  Right coronary artery: The RCA is dominant with normal take off from the right coronary cusp. There is mild calcified plaque in the proximal to mid RCA. The distal RCA is occluded (100%) with appearance of chronic total occlusion.  Cardiac Morphology:  Right Atrium: Right atrial size is within normal limits.  Right Ventricle: The right ventricular cavity is within normal limits.  Left Atrium: Left atrial size is normal in size with no left atrial appendage filling defect.  Left Ventricle: The ventricular cavity size is within normal limits. There are no stigmata of prior infarction. There is no abnormal filling defect. LVEF=85%. No regional wall motion abnormalities.  Pulmonary arteries: Normal in size without proximal filling defect.  Pulmonary veins: Normal pulmonary venous drainage.  Pericardium: Normal thickness with no significant effusion or calcium  present.  Mitral Valve: The mitral valve is degenerative with severe mitral annular calcium .  Extra-cardiac findings: See attached radiology report for non-cardiac structures.  IMPRESSION: 1. Tricuspid aortic valve with severe calcifications. Partial fusion of the RCC/LCC. Bulky calcification of the NCC.  2. Normal dimensions of the ascending aorta and aortic root.  3. Normal  coronary origin.  Right dominance.  4. Coronary calcium  score of 1536. This was 98th percentile for age-, sex, and race-matched controls.  5. The distal RCA is occluded (100%) with appearance of chronic total occlusion.  6. Mild calcified plaque in the LAD and LCX (25-49%).  Darryle T. Barbaraann, MD  Electronically Signed: By: Darryle Barbaraann M.D. On: 07/25/2024 20:34     ______________________________________________________________________________________________     EKG: *** I have independently reviewed the above radiologic studies and discussed with the patient   Recent Lab  Findings: Lab Results  Component Value Date   WBC 11.9 (H) 07/29/2024   HGB 16.8 07/29/2024   HCT 49.6 07/29/2024   PLT 354 07/29/2024   GLUCOSE 94 07/23/2024   CHOL 175 03/25/2024   TRIG 178 (H) 03/25/2024   HDL 45 03/25/2024   LDLCALC 99 03/25/2024   ALT 31 05/23/2023   AST 27 05/23/2023   NA 136 07/23/2024   K 4.2 07/23/2024   CL 99 07/23/2024   CREATININE 0.75 (L) 07/23/2024   BUN 20 07/23/2024   CO2 23 07/23/2024   TSH 0.741 02/09/2021   HGBA1C 6.6 (A) 03/25/2024      Assessment / Plan:   58 y.o. male with ***     I  spent {CHL ONC TIME VISIT - DTPQU:[id number]} counseling the patient face to face.   Con RAMAN Abdulrahim Siddiqi 08/03/2024 6:35 PM

## 2024-08-03 NOTE — H&P (View-Only) (Signed)
 [address] [place] [address]             [phone]    Chris Skinner [facility] Medical Record #[id number] Date of Birth: 19-Jul-1966  Referring: Skinner Chris Ned, * Primary Care: [name], MD Primary Cardiologist:[name] Barbaraann, MD  Chief Complaint:   No chief complaint on file.   History of Present Illness:     Chris Skinner is a 58 y.o. male who presents for surgical evaluation of severe aortic stenosis and coronary artery disease.  He has a history of HL and ?active tobacco use.  He is enrolled in a drug study for a cholesterol medication by [employer] and is unsure if he's on the experimental medication or placebo.     LHC (08/05/24): TBD RHC (08/05/24): TBD TTE (07/23/24): LVEF 60-65%, Normal RV function.  Bicuspid aortic valve with mild AI, severe AS (MG 54, AVA 0.83, Vmax 4.71), no MR, no MS, severe MAC Chest CT (07/27/24): CTO of RCA, no significant aortic calcium  PFT (***): *** Carotid US  (07/31/24): Right: 1-39%, Left: 1-39%  Past Medical and Surgical History: Previous Chest Surgery: *** Previous Chest Radiation: *** Diabetes Mellitus: Yes.  HbA1C 6.6% (03/25/24) Anticoagulation: No, Last dose N/A  Creatinine:  Lab Results  Component Value Date   CREATININE 0.75 (L) 07/23/2024   CREATININE 0.77 05/23/2023   CREATININE 0.80 05/17/2022     Past Medical History:  Diagnosis Date   Chest pain 2001   [place], normal cardiac workup   Diabetes mellitus without complication (HCC)    Dyslipidemia    H/O echocardiogram 09/2013   PFO found incidentally.  Echo done due to murmur   Heart murmur    mild aortic stenosis   Hyperlipidemia    Hypertension    Personal history of colonic polyp - adenoma 01/05/2014   Sleep apnea    cpap   Tobacco use disorder    Wears glasses     Past Surgical History:  Procedure Laterality Date   BIOPSY  01/17/2021   Procedure: BIOPSY;  Surgeon: Aneita Gwendlyn ONEIDA, MD;  Location: [facility]  ENDOSCOPY;  Service: Endoscopy;;   COLONOSCOPY  2015   ESOPHAGOGASTRODUODENOSCOPY (EGD) WITH PROPOFOL  N/A 01/17/2021   Procedure: ESOPHAGOGASTRODUODENOSCOPY (EGD) WITH PROPOFOL ;  Surgeon: Aneita Gwendlyn ONEIDA, MD;  Location: [facility] ENDOSCOPY;  Service: Endoscopy;  Laterality: N/A;   FOREIGN BODY REMOVAL  01/17/2021   Procedure: FOREIGN BODY REMOVAL;  Surgeon: Aneita Gwendlyn ONEIDA, MD;  Location: [facility] ENDOSCOPY;  Service: Endoscopy;;   KNEE ARTHROSCOPY Left 1985   TONSILLECTOMY AND ADENOIDECTOMY  1974    Social History:  Social History   Tobacco Use  Smoking Status Every Day  Smokeless Tobacco Former   Types: Chew   Quit date: 2009    Social History   Substance and Sexual Activity  Alcohol Use Yes   Alcohol/week: 10.0 standard drinks of alcohol   Types: 10 Cans of beer per week     No Known Allergies  Medications: Asprin: *** Statin: *** Beta Blocker: *** Ace Inhibitor: *** Anti-Coagulation: ***  Current Outpatient Medications  Medication Sig Dispense Refill   amLODipine  (NORVASC ) 5 MG tablet TAKE 1 TABLET (5 MG TOTAL) BY MOUTH DAILY. 90 tablet 3   aspirin  EC 81 MG tablet Take 1 tablet (81 mg total) by mouth daily. Swallow whole.     cetirizine (ZYRTEC) 10 MG chewable tablet Chew 10 mg by mouth daily.     dapagliflozin   propanediol (FARXIGA ) 5 MG TABS tablet Take 1 tablet (5 mg total) by mouth daily before breakfast. 90 tablet 0   ezetimibe  (ZETIA ) 10 MG tablet TAKE 1 TABLET BY MOUTH EVERY DAY 90 tablet 3   lisinopril -hydrochlorothiazide  (ZESTORETIC ) 20-12.5 MG tablet TAKE 1 TABLET BY MOUTH EVERY DAY 90 tablet 3   Multiple Vitamins-Minerals (MULTIVITAMIN WITH MINERALS) tablet Take 1 tablet by mouth daily.     Naproxen Sodium 220 MG CAPS Take 220 mg by mouth daily.     PRESCRIPTION MEDICATION Take 30 mg by mouth daily. Study Drug with [employer] Cholesterol Drug     rosuvastatin  (CRESTOR ) 40 MG tablet TAKE 1 TABLET BY MOUTH EVERY DAY 90 tablet 3   No current facility-administered  medications for this visit.    (Not in a hospital admission)   Family History  Problem Relation Age of Onset   Hyperlipidemia Mother    Heart disease Mother    Arthritis Mother    Stroke Mother    Heart disease Father    Diabetes Father        borderline   Crohn's disease Sister    Diabetes Paternal Grandfather    Cancer Neg Hx    Colon cancer Neg Hx    Colon polyps Neg Hx    Esophageal cancer Neg Hx    Stomach cancer Neg Hx    Rectal cancer Neg Hx      Review of Systems:   ROS    Physical Exam: There were no vitals taken for this visit. Physical Exam    Diagnostic Studies & Laboratory data: Cardiac Studies & Procedures   ______________________________________________________________________________________________     ECHOCARDIOGRAM  ECHOCARDIOGRAM COMPLETE 07/23/2024  Narrative ECHOCARDIOGRAM REPORT    Patient Name:   Chris Skinner Date of Exam: 07/23/2024 Medical Rec #:  [id number]      Height:       73.5 in Accession #:    [id number]     Weight:       225.2 lb Date of Birth:  1966/02/23      BSA:          2.274 m Patient Age:    58 years       BP:           117/83 mmHg Patient Gender: M              HR:           59 bpm. Exam Location:  [address]  Procedure: 2D Echo, 3D Echo and Strain Analysis (Both Spectral and Color Flow Doppler were utilized during procedure).  Indications:    I35.0 Nonrheumatic aortic (valve) stenosis  History:        Patient has prior history of Echocardiogram examinations, most recent 01/14/2020. CAD, COPD; Risk Factors:Hypertension, Diabetes, Dyslipidemia, Family History of Coronary Artery Disease, Current Smoker and Sleep Apnea. Bicuspid aortic valve.  Sonographer:    Jon Hacker RCS Referring Phys: [id number] Chris NED O'NEAL  IMPRESSIONS   1. Left ventricular ejection fraction, by estimation, is 60 to 65%. The left ventricle has normal function. The left ventricle has no regional wall motion  abnormalities. There is mild left ventricular hypertrophy. The average left ventricular global longitudinal strain is -19.2 %. The global longitudinal strain is normal. 2. Right ventricular systolic function is normal. The right ventricular size is normal. 3. The mitral valve is degenerative. No evidence of mitral valve regurgitation. No evidence of mitral stenosis. Severe mitral annular calcification. 4. DVI 0.26. The  aortic valve is bicuspid. There is severe calcifcation of the aortic valve. Aortic valve regurgitation is mild. Severe aortic valve stenosis. Aortic valve area, by VTI measures 0.83 cm. Aortic valve mean gradient measures 54.0 mmHg. Aortic valve Vmax measures 4.71 m/s. 5. The inferior vena cava is normal in size with greater than 50% respiratory variability, suggesting right atrial pressure of 3 mmHg.  Comparison(s): Changes from prior study are noted. Aortic stenosis has increased to very severe (PV 4.7, MG 54, and AVA 0.83) and there is now mild regurgitation.  FINDINGS Left Ventricle: Left ventricular ejection fraction, by estimation, is 60 to 65%. The left ventricle has normal function. The left ventricle has no regional wall motion abnormalities. The average left ventricular global longitudinal strain is -19.2 %. Strain was performed and the global longitudinal strain is normal. The left ventricular internal cavity size was normal in size. There is mild left ventricular hypertrophy. Left ventricular diastolic function could not be evaluated due to mitral annular calcification (moderate or greater).  Right Ventricle: The right ventricular size is normal. No increase in right ventricular wall thickness. Right ventricular systolic function is normal.  Left Atrium: Left atrial size was normal in size.  Right Atrium: Right atrial size was normal in size.  Pericardium: There is no evidence of pericardial effusion.  Mitral Valve: The mitral valve is degenerative in appearance.  Severe mitral annular calcification. No evidence of mitral valve regurgitation. No evidence of mitral valve stenosis. The mean mitral valve gradient is 4.5 mmHg with average heart rate of 58 bpm.  Tricuspid Valve: The tricuspid valve is normal in structure. Tricuspid valve regurgitation is mild . No evidence of tricuspid stenosis.  Aortic Valve: DVI 0.26. The aortic valve is bicuspid. There is severe calcifcation of the aortic valve. Aortic valve regurgitation is mild. Severe aortic stenosis is present. Aortic valve mean gradient measures 54.0 mmHg. Aortic valve peak gradient measures 88.7 mmHg. Aortic valve area, by VTI measures 0.83 cm.  Pulmonic Valve: The pulmonic valve was normal in structure. Pulmonic valve regurgitation is not visualized. No evidence of pulmonic stenosis.  Aorta: The aortic root is normal in size and structure.  Venous: The inferior vena cava is normal in size with greater than 50% respiratory variability, suggesting right atrial pressure of 3 mmHg.  IAS/Shunts: No atrial level shunt detected by color flow Doppler.  Additional Comments: 3D was performed not requiring image post processing on an independent workstation and was abnormal.   LEFT VENTRICLE PLAX 2D LVIDd:         4.63 cm   Diastology LVIDs:         3.24 cm   LV e' medial:    8.16 cm/s LV PW:         1.37 cm   LV E/e' medial:  15.7 LV IVS:        1.39 cm   LV e' lateral:   11.20 cm/s LVOT diam:     2.00 cm   LV E/e' lateral: 11.4 LV SV:         97 LV SV Index:   43        2D Longitudinal Strain LVOT Area:     3.14 cm  2D Strain GLS (A4C):   -21.1 % LV IVRT:       56 msec   2D Strain GLS (A3C):   -18.2 % 2D Strain GLS (A2C):   -18.3 % 2D Strain GLS Avg:     -19.2 %  3D Volume  EF: 3D EF:        53 % LV EDV:       198 ml LV ESV:       92 ml LV SV:        106 ml  RIGHT VENTRICLE RV Basal diam:  3.56 cm RV S prime:     12.70 cm/s TAPSE (M-mode): 2.3 cm  LEFT ATRIUM             Index         RIGHT ATRIUM           Index LA diam:        4.10 cm 1.80 cm/m   RA Area:     13.40 cm LA Vol (A2C):   59.1 ml 25.98 ml/m  RA Volume:   25.90 ml  11.39 ml/m LA Vol (A4C):   54.4 ml 23.92 ml/m LA Biplane Vol: 57.3 ml 25.19 ml/m AORTIC VALVE AV Area (Vmax):    0.79 cm AV Area (Vmean):   0.75 cm AV Area (VTI):     0.83 cm AV Vmax:           471.00 cm/s AV Vmean:          343.000 cm/s AV VTI:            1.170 m AV Peak Grad:      88.7 mmHg AV Mean Grad:      54.0 mmHg LVOT Vmax:         119.00 cm/s LVOT Vmean:        81.800 cm/s LVOT VTI:          0.310 m LVOT/AV VTI ratio: 0.26  AORTA Ao Root diam: 3.10 cm Ao Asc diam:  3.60 cm  MITRAL VALVE MV Area (PHT): 2.43 cm     SHUNTS MV Mean grad:  4.5 mmHg     Systemic VTI:  0.31 m MV Decel Time: 312 msec     Systemic Diam: 2.00 cm MV E velocity: 128.00 cm/s MV A velocity: 151.00 cm/s MV E/A ratio:  0.85  Franck Azobou Tonleu Electronically signed by Joelle Cedars Tonleu Signature Date/Time: 07/23/2024/10:31:50 AM    Final      CT SCANS  CT CORONARY MORPH W/CTA COR W/SCORE 07/25/2024  Addendum 07/27/2024 11:19 PM ADDENDUM REPORT: 07/27/2024 23:17  EXAM: OVER-READ INTERPRETATION  CT CHEST  The following report is an over-read performed by radiologist Dr. Suzen Dials of [facility] on 07/27/2024. This over-read does not include interpretation of cardiac or coronary anatomy or pathology. The coronary calcium  score/coronary CTA interpretation by the cardiologist is attached.  COMPARISON:  None.  FINDINGS: Cardiovascular: There are no significant extracardiac vascular findings.  Mediastinum/Nodes: There are no enlarged lymph nodes within the visualized mediastinum.  Lungs/Pleura: There is no pleural effusion. The visualized lungs appear clear.  Upper abdomen: No significant findings in the visualized upper abdomen.  Musculoskeletal/Chest wall: No chest wall mass or suspicious  osseous findings within the visualized chest.  IMPRESSION: No significant extracardiac findings within the visualized chest.   Electronically Signed By: Suzen Dials M.D. On: 07/27/2024 23:17  Narrative CLINICAL DATA:  Severe Aortic Stenosis.  Preoperative assessment.  EXAM: Cardiac TAVR CT  TECHNIQUE: A non-contrast, gated CT scan was obtained with axial slices of 2.5 mm through the heart for aortic valve scoring. A 120 kV retrospective, gated, contrast cardiac scan was obtained. Gantry rotation speed was 230 msec and collimation was 0.63 mm. Nitroglycerin was not given. The  3D dataset was reconstructed in systole with motion correction. The 3D data set was reconstructed in 5% intervals of the 0-95% of the R-R cycle. Systolic and diastolic phases were analyzed on a dedicated workstation using MPR, MIP, and VRT modes. The patient received 100 cc of contrast.  FINDINGS: Image quality: Excellent.  Noise artifact is: Limited.  Valve Morphology: The aortic valve appears tricuspid with partial fusion of the RCC/LCC. Severely restricted leaflet motion in systole. Severe bulky calcification of the NCC.  Aortic Valve Calcium  score: 3520  Aortic annular dimension:  Phase assessed: 26%  Annular area: 499 mm2  Annular perimeter: 81.1 mm  Max diameter: 29.4 mm  Min diameter: 23.5 mm  Annular and subannular calcification: None.  Membranous septum length: 10.2 mm  Optimal coplanar projection: RAO 7 CRA 1  Coronary Artery Height above Annulus:  Left Main: 17.9 mm  Right Coronary: 20.4 mm  Sinus of Valsalva Measurements:  Non-coronary: 33 mm  Right-coronary: 33 mm  Left-coronary: 36 mm  Sinus of Valsalva Height:  Non-coronary: 26.0 mm  Right-coronary: 25.2 mm  Left-coronary: 25.1 mm  Sinotubular Junction: 32 mm  Ascending Thoracic Aorta: 35 mm  Coronary Arteries: Coronary calcium  score of 1536. This was 98th percentile for age-, sex, and  race-matched controls. Normal coronary origin. Right dominance.  Left main: The left main is a large caliber vessel with a normal take off from the left coronary cusp that bifurcates to form a left anterior descending artery and a left circumflex artery. There is minimal calcified plaque (<25%).  Left anterior descending artery: The proximal, mid, and distal LAD segments contains mild calcified plaque (25-49%). D1 contains mild calcified plaque (25-49%). D2 contains mild calcified plaque (25-49%).  Left circumflex artery: The LCX is non-dominant. There is minimal calcified plaque (<25%). OM1 contains mild calcified plaque (25-49%).  Right coronary artery: The RCA is dominant with normal take off from the right coronary cusp. There is mild calcified plaque in the proximal to mid RCA. The distal RCA is occluded (100%) with appearance of chronic total occlusion.  Cardiac Morphology:  Right Atrium: Right atrial size is within normal limits.  Right Ventricle: The right ventricular cavity is within normal limits.  Left Atrium: Left atrial size is normal in size with no left atrial appendage filling defect.  Left Ventricle: The ventricular cavity size is within normal limits. There are no stigmata of prior infarction. There is no abnormal filling defect. LVEF=85%. No regional wall motion abnormalities.  Pulmonary arteries: Normal in size without proximal filling defect.  Pulmonary veins: Normal pulmonary venous drainage.  Pericardium: Normal thickness with no significant effusion or calcium  present.  Mitral Valve: The mitral valve is degenerative with severe mitral annular calcium .  Extra-cardiac findings: See attached radiology report for non-cardiac structures.  IMPRESSION: 1. Tricuspid aortic valve with severe calcifications. Partial fusion of the RCC/LCC. Bulky calcification of the NCC.  2. Normal dimensions of the ascending aorta and aortic root.  3. Normal  coronary origin.  Right dominance.  4. Coronary calcium  score of 1536. This was 98th percentile for age-, sex, and race-matched controls.  5. The distal RCA is occluded (100%) with appearance of chronic total occlusion.  6. Mild calcified plaque in the LAD and LCX (25-49%).  Chris T. Barbaraann, MD  Electronically Signed: By: Chris Skinner M.D. On: 07/25/2024 20:34     ______________________________________________________________________________________________     EKG: *** I have independently reviewed the above radiologic studies and discussed with the patient   Recent Lab  Findings: Lab Results  Component Value Date   WBC 11.9 (H) 07/29/2024   HGB 16.8 07/29/2024   HCT 49.6 07/29/2024   PLT 354 07/29/2024   GLUCOSE 94 07/23/2024   CHOL 175 03/25/2024   TRIG 178 (H) 03/25/2024   HDL 45 03/25/2024   LDLCALC 99 03/25/2024   ALT 31 05/23/2023   AST 27 05/23/2023   NA 136 07/23/2024   K 4.2 07/23/2024   CL 99 07/23/2024   CREATININE 0.75 (L) 07/23/2024   BUN 20 07/23/2024   CO2 23 07/23/2024   TSH 0.741 02/09/2021   HGBA1C 6.6 (A) 03/25/2024      Assessment / Plan:   58 y.o. male with ***     I  spent {CHL ONC TIME VISIT - DTPQU:[id number]} counseling the patient face to face.   Con RAMAN Abdulrahim Siddiqi 08/03/2024 6:35 PM

## 2024-08-04 ENCOUNTER — Ambulatory Visit

## 2024-08-04 VITALS — BP 122/76 | HR 65 | Resp 20 | Ht 73.0 in | Wt 223.0 lb

## 2024-08-04 DIAGNOSIS — I350 Nonrheumatic aortic (valve) stenosis: Secondary | ICD-10-CM

## 2024-08-05 ENCOUNTER — Other Ambulatory Visit: Payer: Self-pay

## 2024-08-05 ENCOUNTER — Encounter (HOSPITAL_COMMUNITY)
Admission: RE | Disposition: A | Discharge status: Home / Self Care | Payer: Self-pay | Source: Home / Self Care | Attending: Cardiovascular Disease

## 2024-08-05 ENCOUNTER — Ambulatory Visit (HOSPITAL_COMMUNITY)
Admission: RE | Admit: 2024-08-05 | Discharge: 2024-08-05 | Disposition: A | Discharge status: Home / Self Care | Attending: Cardiovascular Disease | Admitting: Cardiovascular Disease

## 2024-08-05 ENCOUNTER — Other Ambulatory Visit: Payer: Self-pay | Admitting: *Deleted

## 2024-08-05 ENCOUNTER — Encounter: Payer: Self-pay | Admitting: *Deleted

## 2024-08-05 DIAGNOSIS — Z7982 Long term (current) use of aspirin: Secondary | ICD-10-CM | POA: Diagnosis not present

## 2024-08-05 DIAGNOSIS — I10 Essential (primary) hypertension: Secondary | ICD-10-CM | POA: Diagnosis not present

## 2024-08-05 DIAGNOSIS — I350 Nonrheumatic aortic (valve) stenosis: Secondary | ICD-10-CM | POA: Diagnosis present

## 2024-08-05 DIAGNOSIS — E785 Hyperlipidemia, unspecified: Secondary | ICD-10-CM | POA: Diagnosis not present

## 2024-08-05 DIAGNOSIS — E119 Type 2 diabetes mellitus without complications: Secondary | ICD-10-CM | POA: Insufficient documentation

## 2024-08-05 DIAGNOSIS — I2510 Atherosclerotic heart disease of native coronary artery without angina pectoris: Secondary | ICD-10-CM

## 2024-08-05 DIAGNOSIS — Z720 Tobacco use: Secondary | ICD-10-CM | POA: Diagnosis not present

## 2024-08-05 DIAGNOSIS — Q2381 Bicuspid aortic valve: Secondary | ICD-10-CM | POA: Diagnosis not present

## 2024-08-05 DIAGNOSIS — I2582 Chronic total occlusion of coronary artery: Secondary | ICD-10-CM | POA: Insufficient documentation

## 2024-08-05 DIAGNOSIS — Z79899 Other long term (current) drug therapy: Secondary | ICD-10-CM | POA: Diagnosis not present

## 2024-08-05 HISTORY — PX: CORONARY ANGIOGRAPHY: CATH118303

## 2024-08-05 LAB — GLUCOSE, CAPILLARY
Glucose-Capillary: 131 mg/dL — ABNORMAL HIGH (ref 70–99)
Glucose-Capillary: 128 mg/dL — ABNORMAL HIGH (ref 70–99)

## 2024-08-05 SURGERY — CORONARY ANGIOGRAPHY (CATH LAB)
Anesthesia: LOCAL

## 2024-08-05 MED ORDER — SODIUM CHLORIDE 0.9% FLUSH: 3.0000 mL | INTRAVENOUS | Status: DC | PRN

## 2024-08-05 MED ORDER — ASPIRIN 81 MG PO CHEW: 81.0000 mg | CHEWABLE_TABLET | ORAL | Status: DC

## 2024-08-05 MED ORDER — LIDOCAINE HCL (PF) 1 % IJ SOLN
INTRAMUSCULAR | Status: AC
  Filled 2024-08-05: qty 30

## 2024-08-05 MED ORDER — FENTANYL CITRATE (PF) 100 MCG/2ML IJ SOLN
INTRAMUSCULAR | Status: AC
  Filled 2024-08-05: qty 2

## 2024-08-05 MED ORDER — SODIUM CHLORIDE 0.9% FLUSH: 3.0000 mL | Freq: Two times a day (BID) | INTRAVENOUS | Status: DC

## 2024-08-05 MED ORDER — MIDAZOLAM HCL (PF) 2 MG/2ML IJ SOLN
INTRAMUSCULAR | Status: DC | PRN
Start: 2024-08-05 — End: 2024-08-05
  Administered 2024-08-05: 2 mg via INTRAVENOUS

## 2024-08-05 MED ORDER — IOHEXOL 350 MG/ML SOLN
INTRAVENOUS | Status: DC | PRN
Start: 2024-08-05 — End: 2024-08-05
  Administered 2024-08-05: 55 mL

## 2024-08-05 MED ORDER — SODIUM CHLORIDE 0.9 % IV SOLN: 250.0000 mL | INTRAVENOUS | Status: DC | PRN

## 2024-08-05 MED ORDER — ACETAMINOPHEN 325 MG PO TABS: 650.0000 mg | ORAL_TABLET | ORAL | Status: DC | PRN

## 2024-08-05 MED ORDER — HEPARIN SODIUM (PORCINE) 1000 UNIT/ML IJ SOLN
INTRAMUSCULAR | Status: DC | PRN
  Administered 2024-08-05: 5000 [IU] via INTRAVENOUS

## 2024-08-05 MED ORDER — HYDRALAZINE HCL 20 MG/ML IJ SOLN: 10.0000 mg | INTRAMUSCULAR | Status: DC | PRN

## 2024-08-05 MED ORDER — VERAPAMIL HCL 2.5 MG/ML IV SOLN
INTRAVENOUS | Status: AC
  Filled 2024-08-05: qty 2

## 2024-08-05 MED ORDER — LIDOCAINE HCL (PF) 1 % IJ SOLN
INTRAMUSCULAR | Status: DC | PRN
Start: 2024-08-05 — End: 2024-08-05
  Administered 2024-08-05: 5 mL via INTRADERMAL

## 2024-08-05 MED ORDER — HEPARIN (PORCINE) IN NACL 1000-0.9 UT/500ML-% IV SOLN
INTRAVENOUS | Status: DC | PRN
  Administered 2024-08-05 (×2): 500 mL

## 2024-08-05 MED ORDER — VERAPAMIL HCL 2.5 MG/ML IV SOLN
INTRAVENOUS | Status: DC | PRN
  Administered 2024-08-05: 10 mL via INTRA_ARTERIAL

## 2024-08-05 MED ORDER — HEPARIN SODIUM (PORCINE) 1000 UNIT/ML IJ SOLN
INTRAMUSCULAR | Status: AC
  Filled 2024-08-05: qty 10

## 2024-08-05 MED ORDER — FREE WATER: 500.0000 mL | Freq: Once | Status: DC

## 2024-08-05 MED ORDER — MIDAZOLAM HCL 2 MG/2ML IJ SOLN
INTRAMUSCULAR | Status: AC
  Filled 2024-08-05: qty 2

## 2024-08-05 MED ORDER — ONDANSETRON HCL 4 MG/2ML IJ SOLN
4.0000 mg | Freq: Four times a day (QID) | INTRAMUSCULAR | Status: DC | PRN
Start: 2024-08-05 — End: 2024-08-05

## 2024-08-05 MED ORDER — LABETALOL HCL 5 MG/ML IV SOLN
10.0000 mg | INTRAVENOUS | Status: DC | PRN
Start: 2024-08-05 — End: 2024-08-05

## 2024-08-05 MED ORDER — FENTANYL CITRATE (PF) 100 MCG/2ML IJ SOLN
INTRAMUSCULAR | Status: DC | PRN
  Administered 2024-08-05: 25 ug via INTRAVENOUS

## 2024-08-05 SURGICAL SUPPLY — 6 items
CATH 5FR JL3.5 JR4 ANG PIG MP (CATHETERS) IMPLANT
DEVICE RAD COMP TR BAND LRG (VASCULAR PRODUCTS) IMPLANT
GLIDESHEATH SLEND SS 6F .021 (SHEATH) IMPLANT
GUIDEWIRE INQWIRE 1.5J.035X260 (WIRE) IMPLANT
PACK CARDIAC CATHETERIZATION (CUSTOM PROCEDURE TRAY) ×2 IMPLANT
SET ATX-X65L (MISCELLANEOUS) IMPLANT

## 2024-08-05 NOTE — Discharge Instructions (Signed)

## 2024-08-05 NOTE — Progress Notes (Signed)
 Patient and wife was given discharge instructions. Both verbalized understanding.

## 2024-08-05 NOTE — Interval H&P Note (Signed)
 History and Physical Interval Note:  08/05/2024 8:49 AM  Chris Skinner  has presented today for surgery, with the diagnosis of aortic stenosis.  The various methods of treatment have been discussed with the patient and family. After consideration of risks, benefits and other options for treatment, the patient has consented to  Procedure(s): LEFT HEART CATH AND CORONARY ANGIOGRAPHY (N/A) as a surgical intervention.  The patient's history has been reviewed, patient examined, no change in status, stable for surgery.  I have reviewed the patient's chart and labs.  Questions were answered to the patient's satisfaction.     [name]

## 2024-08-06 ENCOUNTER — Encounter (HOSPITAL_COMMUNITY): Payer: Self-pay | Admitting: Cardiovascular Disease

## 2024-08-20 NOTE — Progress Notes (Signed)
 Surgical Instructions   Your procedure is scheduled on Monday August 25, 2024. Report to [facility] Main Entrance A at 5:30 A.M., then check in with the Admitting office. Any questions or running late day of surgery: call [phone]  Questions prior to your surgery date: call [phone], Monday-Friday, 8am-4pm. If you experience any cold or flu symptoms such as cough, fever, chills, shortness of breath, etc. between now and your scheduled surgery, please notify us  at the above number.     Remember:  Do not eat or drink after midnight the night before your surgery  Take these medicines the morning of surgery with A SIP OF WATER   amLODipine  (NORVASC )  cetirizine (ZYRTEC)  ezetimibe  (ZETIA ) Investigational - Study Medication; PURSUIT Phase IIb trial for [employer]'s [id number]   rosuvastatin  (CRESTOR )   CONTINUE TO TAKE YOUR ASPIRIN , BUT DO NOT TAKE IT THE MORNING OF SURGERY.   One week prior to surgery, STOP taking any Aleve, Naproxen, Ibuprofen, Motrin, Advil, Goody's, BC's, all herbal medications, fish oil, and non-prescription vitamins.   WHAT DO I DO ABOUT MY DIABETES MEDICATION?   Do not take oral diabetes medicines (pills) the morning of surgery.        PER YOUR SURGEON'S INSTRUCTIONS, HOLD YOUR dapagliflozin  propanediol (FARXIGA ) 3 DAYS PRIOR TO SURGERY WITH THE LAST DOSE BEING 08/21/2024.    The day of surgery, do not take other diabetes injectables, including Byetta (exenatide), Bydureon (exenatide ER), Victoza (liraglutide), or Trulicity (dulaglutide).  If your CBG is greater than 220 mg/dL, you may take  of your sliding scale (correction) dose of insulin.   HOW TO MANAGE YOUR DIABETES BEFORE AND AFTER SURGERY  Why is it important to control my blood sugar before and after surgery? Improving blood sugar levels before and after surgery helps healing and can limit problems. A way of improving blood sugar control is eating a healthy diet by:  Eating less sugar  and carbohydrates  Increasing activity/exercise  Talking with your doctor about reaching your blood sugar goals High blood sugars (greater than 180 mg/dL) can raise your risk of infections and slow your recovery, so you will need to focus on controlling your diabetes during the weeks before surgery. Make sure that the doctor who takes care of your diabetes knows about your planned surgery including the date and location.  How do I manage my blood sugar before surgery? Check your blood sugar at least 4 times a day, starting 2 days before surgery, to make sure that the level is not too high or low.  Check your blood sugar the morning of your surgery when you wake up and every 2 hours until you get to the Short Stay unit.  If your blood sugar is less than 70 mg/dL, you will need to treat for low blood sugar: Do not take insulin. Treat a low blood sugar (less than 70 mg/dL) with  cup of clear juice (cranberry or apple), 4 glucose tablets, OR glucose gel. Recheck blood sugar in 15 minutes after treatment (to make sure it is greater than 70 mg/dL). If your blood sugar is not greater than 70 mg/dL on recheck, call [phone] for further instructions. Report your blood sugar to the short stay nurse when you get to Short Stay.  If you are admitted to the hospital after surgery: Your blood sugar will be checked by the staff and you will probably be given insulin after surgery (instead of oral diabetes medicines) to make sure you have good blood sugar levels. The  goal for blood sugar control after surgery is 80-180 mg/dL.                      Do NOT Smoke (Tobacco/Vaping) for 24 hours prior to your procedure.  If you use a CPAP at night, you may bring your mask/headgear for your overnight stay.   You will be asked to remove any contacts, glasses, piercing's, hearing aid's, dentures/partials prior to surgery. Please bring cases for these items if needed.    Patients discharged the day of surgery  will not be allowed to drive home, and someone needs to stay with them for 24 hours.  SURGICAL WAITING ROOM VISITATION Patients may have no more than 2 support people in the waiting area - these visitors may rotate.   Pre-op nurse will coordinate an appropriate time for 1 ADULT support person, who may not rotate, to accompany patient in pre-op.  Children under the age of 58 must have an adult with them who is not the patient and must remain in the main waiting area with an adult.  If the patient needs to stay at the hospital during part of their recovery, the visitor guidelines for inpatient rooms apply.  Please refer to the [facility] website for the visitor guidelines for any additional information.   If you received a COVID test during your pre-op visit  it is requested that you wear a mask when out in public, stay away from anyone that may not be feeling well and notify your surgeon if you develop symptoms. If you have been in contact with anyone that has tested positive in the last 10 days please notify you surgeon.      Pre-operative CHG Bathing Instructions   You can play a key role in reducing the risk of infection after surgery. Your skin needs to be as free of germs as possible. You can reduce the number of germs on your skin by washing with CHG (chlorhexidine gluconate) soap before surgery. CHG is an antiseptic soap that kills germs and continues to kill germs even after washing.   DO NOT use if you have an allergy to chlorhexidine/CHG or antibacterial soaps. If your skin becomes reddened or irritated, stop using the CHG and notify one of our RNs at [phone].              TAKE A SHOWER THE NIGHT BEFORE SURGERY   Please keep in mind the following:  You may shave your face before/day of surgery.  Place clean sheets on your bed the night before surgery Use a clean washcloth (not used since being washed) for shower. DO NOT sleep with pet's night before surgery.  CHG Shower  Instructions:  Wash your face and private area with normal soap. If you choose to wash your hair, wash first with your normal shampoo.  After you use shampoo/soap, rinse your hair and body thoroughly to remove shampoo/soap residue.  Turn the water  OFF and apply half the bottle of CHG soap to a CLEAN washcloth.  Apply CHG soap ONLY FROM YOUR NECK DOWN TO YOUR TOES (washing for 3-5 minutes)  DO NOT use CHG soap on face, private areas, open wounds, or sores.  Pay special attention to the area where your surgery is being performed.  If you are having back surgery, having someone wash your back for you may be helpful. Wait 2 minutes after CHG soap is applied, then you may rinse off the CHG soap.  Pat dry  with a clean towel  Put on clean pajamas    Additional instructions for the day of surgery: If you choose, you may shower the morning of surgery with an antibacterial soap.  DO NOT APPLY any lotions, deodorants or cologne.   Do not wear jewelry  Do not bring valuables to the hospital. [facility] is not responsible for valuables/personal belongings. Put on clean/comfortable clothes.  Please brush your teeth.  Ask your nurse before applying any prescription medications to the skin.

## 2024-08-21 ENCOUNTER — Ambulatory Visit (HOSPITAL_COMMUNITY)
Admission: RE | Admit: 2024-08-21 | Discharge: 2024-08-21 | Disposition: A | Discharge status: Home / Self Care | Source: Ambulatory Visit

## 2024-08-21 ENCOUNTER — Inpatient Hospital Stay (HOSPITAL_COMMUNITY): Admission: RE | Admit: 2024-08-21 | Discharge: 2024-08-21 | Disposition: A | Source: Ambulatory Visit

## 2024-08-21 ENCOUNTER — Encounter (HOSPITAL_COMMUNITY): Payer: Self-pay

## 2024-08-21 ENCOUNTER — Telehealth: Payer: Self-pay | Admitting: Cardiovascular Disease

## 2024-08-21 ENCOUNTER — Other Ambulatory Visit: Payer: Self-pay

## 2024-08-21 VITALS — BP 136/90 | HR 56 | Temp 97.8°F | Resp 18 | Ht 73.5 in | Wt 223.0 lb

## 2024-08-21 DIAGNOSIS — I2510 Atherosclerotic heart disease of native coronary artery without angina pectoris: Secondary | ICD-10-CM | POA: Diagnosis present

## 2024-08-21 DIAGNOSIS — Z01818 Encounter for other preprocedural examination: Secondary | ICD-10-CM

## 2024-08-21 DIAGNOSIS — R9431 Abnormal electrocardiogram [ECG] [EKG]: Secondary | ICD-10-CM | POA: Diagnosis not present

## 2024-08-21 DIAGNOSIS — I350 Nonrheumatic aortic (valve) stenosis: Secondary | ICD-10-CM | POA: Diagnosis present

## 2024-08-21 HISTORY — DX: Atherosclerotic heart disease of native coronary artery without angina pectoris: I25.10

## 2024-08-21 LAB — COMPREHENSIVE METABOLIC PANEL WITH GFR
ALT: 27 U/L (ref 0–44)
AST: 30 U/L (ref 15–41)
Albumin: 4.4 g/dL (ref 3.5–5.0)
Alkaline Phosphatase: 46 U/L (ref 38–126)
Anion gap: 8 (ref 5–15)
BUN: 18 mg/dL (ref 6–20)
CO2: 30 mmol/L (ref 22–32)
Calcium: 9.5 mg/dL (ref 8.9–10.3)
Chloride: 100 mmol/L (ref 98–111)
Creatinine, Ser: 0.84 mg/dL (ref 0.61–1.24)
GFR, Estimated: >60 mL/min (ref >60)
Glucose, Bld: 124 mg/dL — ABNORMAL HIGH (ref 70–99)
Potassium: 4.1 mmol/L (ref 3.5–5.1)
Sodium: 138 mmol/L (ref 135–145)
Total Bilirubin: 1.1 mg/dL (ref 0.0–1.2)
Total Protein: 7.3 g/dL (ref 6.5–8.1)

## 2024-08-21 LAB — CBC
HCT: 48.4 % (ref 39.0–52.0)
Hemoglobin: 17.1 g/dL — ABNORMAL HIGH (ref 13.0–17.0)
MCH: 32.1 pg (ref 26.0–34.0)
MCHC: 35.3 g/dL (ref 30.0–36.0)
MCV: 91 fL (ref 80.0–100.0)
Platelets: 300 K/uL (ref 150–400)
RBC: 5.32 MIL/uL (ref 4.22–5.81)
RDW: 11.7 % (ref 11.5–15.5)
WBC: 10.6 K/uL — ABNORMAL HIGH (ref 4.0–10.5)
nRBC: 0 % (ref 0.0–0.2)

## 2024-08-21 LAB — PULMONARY FUNCTION TEST
DL/VA % pred: 109 %
DL/VA: 4.57 ml/min/mmHg/L
DLCO unc % pred: 97 %
DLCO unc: 29.92 ml/min/mmHg
FEF 25-75 Post: 3.9 L/s
FEF 25-75 Pre: 3.24 L/s
FEF2575-%Change-Post: 20 %
FEF2575-%Pred-Post: 117 %
FEF2575-%Pred-Pre: 98 %
FEV1-%Change-Post: 5 %
FEV1-%Pred-Post: 98 %
FEV1-%Pred-Pre: 94 %
FEV1-Post: 4.02 L
FEV1-Pre: 3.82 L
FEV1FVC-%Change-Post: 4 %
FEV1FVC-%Pred-Pre: 100 %
FEV6-%Change-Post: 2 %
FEV6-%Pred-Post: 98 %
FEV6-%Pred-Pre: 96 %
FEV6-Post: 5.06 L
FEV6-Pre: 4.96 L
FEV6FVC-%Change-Post: 1 %
FEV6FVC-%Pred-Post: 104 %
FEV6FVC-%Pred-Pre: 102 %
FVC-%Change-Post: 0 %
FVC-%Pred-Post: 94 %
FVC-%Pred-Pre: 93 %
FVC-Post: 5.06 L
FVC-Pre: 5.03 L
Post FEV1/FVC ratio: 79 %
Post FEV6/FVC ratio: 100 %
Pre FEV1/FVC ratio: 76 %
Pre FEV6/FVC Ratio: 99 %
RV % pred: 118 %
RV: 2.88 L
TLC % pred: 103 %
TLC: 7.93 L

## 2024-08-21 LAB — URINALYSIS, MICROSCOPIC (REFLEX): Bacteria, UA: NONE SEEN

## 2024-08-21 LAB — URINALYSIS, ROUTINE W REFLEX MICROSCOPIC
Bilirubin Urine: NEGATIVE
Glucose, UA: >=500 mg/dL — AB
Hgb urine dipstick: NEGATIVE
Ketones, ur: NEGATIVE mg/dL
Leukocytes,Ua: NEGATIVE
Nitrite: NEGATIVE
Protein, ur: NEGATIVE mg/dL
Specific Gravity, Urine: 1.02 (ref 1.005–1.030)
pH: 6 (ref 5.0–8.0)

## 2024-08-21 LAB — TYPE AND SCREEN
ABO/RH(D): B POS
Antibody Screen: NEGATIVE

## 2024-08-21 LAB — HEMOGLOBIN A1C
Hgb A1c MFr Bld: 6 % — ABNORMAL HIGH (ref 4.8–5.6)
Mean Plasma Glucose: 125.5 mg/dL

## 2024-08-21 LAB — PROTIME-INR
INR: 1 (ref 0.8–1.2)
Prothrombin Time: 13.6 s (ref 11.4–15.2)

## 2024-08-21 LAB — GLUCOSE, CAPILLARY: Glucose-Capillary: 111 mg/dL — ABNORMAL HIGH (ref 70–99)

## 2024-08-21 LAB — APTT: aPTT: 33 s (ref 24–36)

## 2024-08-21 LAB — SURGICAL PCR SCREEN
MRSA, PCR: POSITIVE — AB
Staphylococcus aureus: POSITIVE — AB

## 2024-08-21 MED ORDER — ALBUTEROL SULFATE (2.5 MG/3ML) 0.083% IN NEBU
2.5000 mg | INHALATION_SOLUTION | Freq: Once | RESPIRATORY_TRACT | Status: AC
  Administered 2024-08-21: 2.5 mg via RESPIRATORY_TRACT

## 2024-08-21 NOTE — Progress Notes (Signed)
 PCP - Norleen Pepper Cardiologist - Darryle Decent  PPM/ICD - denies Device Orders - n/a Rep Notified -  n/a  Chest x-ray - 08/21/24 EKG - 08/21/24 Stress Test -  ECHO - 07/23/24 Cardiac Cath - 07/29/24  Sleep Study - OSA+ and wears CPAP   Fasting Blood Sugar - unknown - patient does not check blood sugars at home   Last dose of GLP1 agonist-  n/a GLP1 instructions:  n/a  Last dose of Jardiance - 08/20/24  Blood Thinner Instructions: n/a Aspirin  Instructions:continue Aspirin  but do not take day of surgery  ERAS Protcol - NPO PRE-SURGERY Ensure or G2-  n/a  COVID TEST- n/a   Anesthesia review:  no  Patient denies shortness of breath, fever, cough and chest pain at PAT appointment   All instructions explained to the patient, with a verbal understanding of the material. Patient agrees to go over the instructions while at home for a better understanding. Patient also instructed to self quarantine after being tested for COVID-19. The opportunity to ask questions was provided.

## 2024-08-21 NOTE — Telephone Encounter (Signed)
 Received FMLA form from [employer].  Patient signed the Release of Information and paid $29 fee.  Form in Dr. Rosslyn box.

## 2024-08-21 NOTE — Progress Notes (Signed)
 Notified Bernardino Sprang of positive surgical PCR.

## 2024-08-22 MED ORDER — DEXMEDETOMIDINE HCL IN NACL 400 MCG/100ML IV SOLN
0.1000 ug/kg/h | INTRAVENOUS | Status: AC
  Administered 2024-08-25: 12:00:00 .7 ug/kg/h via INTRAVENOUS
  Filled 2024-08-22: qty 100

## 2024-08-22 MED ORDER — PHENYLEPHRINE HCL-NACL 20-0.9 MG/250ML-% IV SOLN
30.0000 ug/min | INTRAVENOUS | Status: DC
  Filled 2024-08-22: qty 250

## 2024-08-22 MED ORDER — CEFAZOLIN SODIUM-DEXTROSE 2-4 GM/100ML-% IV SOLN
2.0000 g | INTRAVENOUS | Status: DC
  Filled 2024-08-22: qty 100

## 2024-08-22 MED ORDER — TRANEXAMIC ACID (OHS) PUMP PRIME SOLUTION
2.0000 mg/kg | INTRAVENOUS | Status: DC
  Filled 2024-08-22: qty 2.02

## 2024-08-22 MED ORDER — PLASMA-LYTE A IV SOLN
INTRAVENOUS | Status: DC
  Filled 2024-08-22: qty 2.5

## 2024-08-22 MED ORDER — MILRINONE LACTATE IN DEXTROSE 20-5 MG/100ML-% IV SOLN
0.3000 ug/kg/min | INTRAVENOUS | Status: DC
  Filled 2024-08-22: qty 100

## 2024-08-22 MED ORDER — TRANEXAMIC ACID 1000 MG/10ML IV SOLN
1.5000 mg/kg/h | INTRAVENOUS | Status: AC
  Administered 2024-08-25: 09:00:00 1.5 mg/kg/h via INTRAVENOUS
  Filled 2024-08-22: qty 25

## 2024-08-22 MED ORDER — NITROGLYCERIN IN D5W 200-5 MCG/ML-% IV SOLN
2.0000 ug/min | INTRAVENOUS | Status: DC
  Filled 2024-08-22: qty 250

## 2024-08-22 MED ORDER — MANNITOL 20 % IV SOLN
INTRAVENOUS | Status: DC
  Filled 2024-08-22: qty 13

## 2024-08-22 MED ORDER — INSULIN REGULAR(HUMAN) IN NACL 100-0.9 UT/100ML-% IV SOLN
INTRAVENOUS | Status: AC
  Administered 2024-08-25: 08:00:00 4.4 [IU]/h via INTRAVENOUS
  Filled 2024-08-22: qty 100

## 2024-08-22 MED ORDER — TRANEXAMIC ACID (OHS) BOLUS VIA INFUSION
15.0000 mg/kg | INTRAVENOUS | Status: AC
  Administered 2024-08-25: 08:00:00 1518 mg via INTRAVENOUS
  Filled 2024-08-22: qty 1518

## 2024-08-22 MED ORDER — EPINEPHRINE HCL 5 MG/250ML IV SOLN IN NS
0.0000 ug/min | INTRAVENOUS | Status: DC
  Filled 2024-08-22: qty 250

## 2024-08-22 MED ORDER — POTASSIUM CHLORIDE 2 MEQ/ML IV SOLN
80.0000 meq | INTRAVENOUS | Status: DC
  Filled 2024-08-22: qty 40

## 2024-08-22 MED ORDER — VANCOMYCIN HCL 1.5 G IV SOLR
1500.0000 mg | INTRAVENOUS | Status: AC
  Administered 2024-08-25: 08:00:00 1500 mg via INTRAVENOUS
  Filled 2024-08-22: qty 30

## 2024-08-22 MED ORDER — NOREPINEPHRINE 4 MG/250ML-% IV SOLN
0.0000 ug/min | INTRAVENOUS | Status: AC
  Administered 2024-08-25: 08:00:00 2 ug/min via INTRAVENOUS
  Filled 2024-08-22: qty 250

## 2024-08-22 MED ORDER — CEFAZOLIN SODIUM-DEXTROSE 2-4 GM/100ML-% IV SOLN
2.0000 g | INTRAVENOUS | Status: AC
  Administered 2024-08-25 (×2): 2 g via INTRAVENOUS
  Filled 2024-08-22: qty 100

## 2024-08-22 MED ORDER — HEPARIN 30,000 UNITS/1000 ML (OHS) CELLSAVER SOLUTION
Status: DC
  Filled 2024-08-22: qty 1000

## 2024-08-25 ENCOUNTER — Telehealth: Payer: Self-pay

## 2024-08-25 ENCOUNTER — Inpatient Hospital Stay (HOSPITAL_COMMUNITY)

## 2024-08-25 ENCOUNTER — Inpatient Hospital Stay (HOSPITAL_COMMUNITY): Payer: Self-pay | Admitting: Anesthesiology

## 2024-08-25 ENCOUNTER — Inpatient Hospital Stay (HOSPITAL_COMMUNITY): Admission: RE | Disposition: A | Payer: Self-pay | Source: Home / Self Care

## 2024-08-25 ENCOUNTER — Encounter (HOSPITAL_COMMUNITY): Payer: Self-pay

## 2024-08-25 ENCOUNTER — Other Ambulatory Visit: Payer: Self-pay

## 2024-08-25 ENCOUNTER — Inpatient Hospital Stay (HOSPITAL_COMMUNITY)
Admission: RE | Admit: 2024-08-25 | Discharge: 2024-08-30 | DRG: 220 | Disposition: A | Discharge status: Home / Self Care

## 2024-08-25 DIAGNOSIS — Z720 Tobacco use: Secondary | ICD-10-CM | POA: Diagnosis not present

## 2024-08-25 DIAGNOSIS — Z794 Long term (current) use of insulin: Secondary | ICD-10-CM | POA: Diagnosis not present

## 2024-08-25 DIAGNOSIS — I2510 Atherosclerotic heart disease of native coronary artery without angina pectoris: Secondary | ICD-10-CM

## 2024-08-25 DIAGNOSIS — Z79899 Other long term (current) drug therapy: Secondary | ICD-10-CM | POA: Diagnosis not present

## 2024-08-25 DIAGNOSIS — E119 Type 2 diabetes mellitus without complications: Secondary | ICD-10-CM

## 2024-08-25 DIAGNOSIS — I4891 Unspecified atrial fibrillation: Secondary | ICD-10-CM | POA: Diagnosis present

## 2024-08-25 DIAGNOSIS — D62 Acute posthemorrhagic anemia: Secondary | ICD-10-CM | POA: Diagnosis not present

## 2024-08-25 DIAGNOSIS — E876 Hypokalemia: Secondary | ICD-10-CM | POA: Diagnosis present

## 2024-08-25 DIAGNOSIS — I10 Essential (primary) hypertension: Secondary | ICD-10-CM | POA: Diagnosis present

## 2024-08-25 DIAGNOSIS — Z01818 Encounter for other preprocedural examination: Secondary | ICD-10-CM

## 2024-08-25 DIAGNOSIS — Z48812 Encounter for surgical aftercare following surgery on the circulatory system: Secondary | ICD-10-CM | POA: Diagnosis not present

## 2024-08-25 DIAGNOSIS — Q2381 Bicuspid aortic valve: Secondary | ICD-10-CM | POA: Diagnosis not present

## 2024-08-25 DIAGNOSIS — E861 Hypovolemia: Secondary | ICD-10-CM | POA: Diagnosis present

## 2024-08-25 DIAGNOSIS — Z951 Presence of aortocoronary bypass graft: Secondary | ICD-10-CM

## 2024-08-25 DIAGNOSIS — I9581 Postprocedural hypotension: Secondary | ICD-10-CM | POA: Diagnosis not present

## 2024-08-25 DIAGNOSIS — Z8616 Personal history of COVID-19: Secondary | ICD-10-CM | POA: Diagnosis not present

## 2024-08-25 DIAGNOSIS — E669 Obesity, unspecified: Secondary | ICD-10-CM | POA: Diagnosis present

## 2024-08-25 DIAGNOSIS — Z833 Family history of diabetes mellitus: Secondary | ICD-10-CM | POA: Diagnosis not present

## 2024-08-25 DIAGNOSIS — Z8261 Family history of arthritis: Secondary | ICD-10-CM | POA: Diagnosis not present

## 2024-08-25 DIAGNOSIS — I350 Nonrheumatic aortic (valve) stenosis: Secondary | ICD-10-CM

## 2024-08-25 DIAGNOSIS — F17200 Nicotine dependence, unspecified, uncomplicated: Secondary | ICD-10-CM | POA: Diagnosis not present

## 2024-08-25 DIAGNOSIS — Z7901 Long term (current) use of anticoagulants: Secondary | ICD-10-CM | POA: Diagnosis not present

## 2024-08-25 DIAGNOSIS — Z823 Family history of stroke: Secondary | ICD-10-CM

## 2024-08-25 DIAGNOSIS — Z6830 Body mass index (BMI) 30.0-30.9, adult: Secondary | ICD-10-CM

## 2024-08-25 DIAGNOSIS — I97190 Other postprocedural cardiac functional disturbances following cardiac surgery: Secondary | ICD-10-CM | POA: Diagnosis not present

## 2024-08-25 DIAGNOSIS — Z7984 Long term (current) use of oral hypoglycemic drugs: Secondary | ICD-10-CM

## 2024-08-25 DIAGNOSIS — Z8709 Personal history of other diseases of the respiratory system: Secondary | ICD-10-CM | POA: Diagnosis not present

## 2024-08-25 DIAGNOSIS — Z8249 Family history of ischemic heart disease and other diseases of the circulatory system: Secondary | ICD-10-CM | POA: Diagnosis not present

## 2024-08-25 DIAGNOSIS — E785 Hyperlipidemia, unspecified: Secondary | ICD-10-CM

## 2024-08-25 DIAGNOSIS — G4733 Obstructive sleep apnea (adult) (pediatric): Secondary | ICD-10-CM | POA: Diagnosis present

## 2024-08-25 DIAGNOSIS — Z952 Presence of prosthetic heart valve: Principal | ICD-10-CM

## 2024-08-25 DIAGNOSIS — Z7982 Long term (current) use of aspirin: Secondary | ICD-10-CM

## 2024-08-25 DIAGNOSIS — Q2112 Patent foramen ovale: Secondary | ICD-10-CM | POA: Diagnosis not present

## 2024-08-25 DIAGNOSIS — I2582 Chronic total occlusion of coronary artery: Secondary | ICD-10-CM | POA: Diagnosis present

## 2024-08-25 DIAGNOSIS — Z860100 Personal history of colon polyps, unspecified: Secondary | ICD-10-CM

## 2024-08-25 DIAGNOSIS — Z8379 Family history of other diseases of the digestive system: Secondary | ICD-10-CM

## 2024-08-25 DIAGNOSIS — Z83438 Family history of other disorder of lipoprotein metabolism and other lipidemia: Secondary | ICD-10-CM

## 2024-08-25 HISTORY — PX: AORTIC VALVE REPLACEMENT: SHX41

## 2024-08-25 HISTORY — PX: INTRAOPERATIVE TRANSESOPHAGEAL ECHOCARDIOGRAM: SHX5062

## 2024-08-25 HISTORY — PX: CORONARY ARTERY BYPASS GRAFT: SHX141

## 2024-08-25 LAB — POCT I-STAT 7, (LYTES, BLD GAS, ICA,H+H)
Acid-Base Excess: 1 mmol/L (ref 0.0–2.0)
Acid-base deficit: 3 mmol/L — ABNORMAL HIGH (ref 0.0–2.0)
Acid-base deficit: 1 mmol/L (ref 0.0–2.0)
Acid-base deficit: 3 mmol/L — ABNORMAL HIGH (ref 0.0–2.0)
Acid-base deficit: 2 mmol/L (ref 0.0–2.0)
Acid-base deficit: 5 mmol/L — ABNORMAL HIGH (ref 0.0–2.0)
Acid-base deficit: 5 mmol/L — ABNORMAL HIGH (ref 0.0–2.0)
Bicarbonate: 23.8 mmol/L (ref 20.0–28.0)
Bicarbonate: 27.3 mmol/L (ref 20.0–28.0)
Bicarbonate: 23.8 mmol/L (ref 20.0–28.0)
Bicarbonate: 21.8 mmol/L (ref 20.0–28.0)
Bicarbonate: 23.4 mmol/L (ref 20.0–28.0)
Bicarbonate: 21.5 mmol/L (ref 20.0–28.0)
Bicarbonate: 20 mmol/L (ref 20.0–28.0)
Calcium, Ion: 1.24 mmol/L (ref 1.15–1.40)
Calcium, Ion: 1.07 mmol/L — ABNORMAL LOW (ref 1.15–1.40)
Calcium, Ion: 1.09 mmol/L — ABNORMAL LOW (ref 1.15–1.40)
Calcium, Ion: 1.11 mmol/L — ABNORMAL LOW (ref 1.15–1.40)
Calcium, Ion: 1.11 mmol/L — ABNORMAL LOW (ref 1.15–1.40)
Calcium, Ion: 1.15 mmol/L (ref 1.15–1.40)
Calcium, Ion: 1.06 mmol/L — ABNORMAL LOW (ref 1.15–1.40)
HCT: 41 % (ref 39.0–52.0)
HCT: 32 % — ABNORMAL LOW (ref 39.0–52.0)
HCT: 32 % — ABNORMAL LOW (ref 39.0–52.0)
HCT: 33 % — ABNORMAL LOW (ref 39.0–52.0)
HCT: 32 % — ABNORMAL LOW (ref 39.0–52.0)
HCT: 33 % — ABNORMAL LOW (ref 39.0–52.0)
HCT: 30 % — ABNORMAL LOW (ref 39.0–52.0)
Hemoglobin: 13.9 g/dL (ref 13.0–17.0)
Hemoglobin: 10.9 g/dL — ABNORMAL LOW (ref 13.0–17.0)
Hemoglobin: 10.9 g/dL — ABNORMAL LOW (ref 13.0–17.0)
Hemoglobin: 11.2 g/dL — ABNORMAL LOW (ref 13.0–17.0)
Hemoglobin: 10.9 g/dL — ABNORMAL LOW (ref 13.0–17.0)
Hemoglobin: 11.2 g/dL — ABNORMAL LOW (ref 13.0–17.0)
Hemoglobin: 10.2 g/dL — ABNORMAL LOW (ref 13.0–17.0)
O2 Saturation: 100 %
O2 Saturation: 100 %
O2 Saturation: 100 %
O2 Saturation: 100 %
O2 Saturation: 100 %
O2 Saturation: 94 %
O2 Saturation: 93 %
Patient temperature: 37
Potassium: 3.8 mmol/L (ref 3.5–5.1)
Potassium: 4.4 mmol/L (ref 3.5–5.1)
Potassium: 4.3 mmol/L (ref 3.5–5.1)
Potassium: 4.2 mmol/L (ref 3.5–5.1)
Potassium: 3.6 mmol/L (ref 3.5–5.1)
Potassium: 4.4 mmol/L (ref 3.5–5.1)
Potassium: 3.9 mmol/L (ref 3.5–5.1)
Sodium: 138 mmol/L (ref 135–145)
Sodium: 138 mmol/L (ref 135–145)
Sodium: 138 mmol/L (ref 135–145)
Sodium: 137 mmol/L (ref 135–145)
Sodium: 140 mmol/L (ref 135–145)
Sodium: 141 mmol/L (ref 135–145)
Sodium: 143 mmol/L (ref 135–145)
TCO2: 25 mmol/L (ref 22–32)
TCO2: 29 mmol/L (ref 22–32)
TCO2: 25 mmol/L (ref 22–32)
TCO2: 23 mmol/L (ref 22–32)
TCO2: 25 mmol/L (ref 22–32)
TCO2: 23 mmol/L (ref 22–32)
TCO2: 21 mmol/L — ABNORMAL LOW (ref 22–32)
pCO2 arterial: 45.7 mmHg (ref 32–48)
pCO2 arterial: 50.7 mmHg — ABNORMAL HIGH (ref 32–48)
pCO2 arterial: 41.3 mmHg (ref 32–48)
pCO2 arterial: 37.8 mmHg (ref 32–48)
pCO2 arterial: 42.9 mmHg (ref 32–48)
pCO2 arterial: 42.2 mmHg (ref 32–48)
pCO2 arterial: 36.1 mmHg (ref 32–48)
pH, Arterial: 7.323 — ABNORMAL LOW (ref 7.35–7.45)
pH, Arterial: 7.339 — ABNORMAL LOW (ref 7.35–7.45)
pH, Arterial: 7.369 (ref 7.35–7.45)
pH, Arterial: 7.37 (ref 7.35–7.45)
pH, Arterial: 7.345 — ABNORMAL LOW (ref 7.35–7.45)
pH, Arterial: 7.314 — ABNORMAL LOW (ref 7.35–7.45)
pH, Arterial: 7.351 (ref 7.35–7.45)
pO2, Arterial: 207 mmHg — ABNORMAL HIGH (ref 83–108)
pO2, Arterial: 321 mmHg — ABNORMAL HIGH (ref 83–108)
pO2, Arterial: 371 mmHg — ABNORMAL HIGH (ref 83–108)
pO2, Arterial: 373 mmHg — ABNORMAL HIGH (ref 83–108)
pO2, Arterial: 361 mmHg — ABNORMAL HIGH (ref 83–108)
pO2, Arterial: 77 mmHg — ABNORMAL LOW (ref 83–108)
pO2, Arterial: 71 mmHg — ABNORMAL LOW (ref 83–108)

## 2024-08-25 LAB — POCT I-STAT, CHEM 8
BUN: 18 mg/dL (ref 6–20)
BUN: 17 mg/dL (ref 6–20)
BUN: 18 mg/dL (ref 6–20)
BUN: 17 mg/dL (ref 6–20)
BUN: 17 mg/dL (ref 6–20)
BUN: 17 mg/dL (ref 6–20)
Calcium, Ion: 1.25 mmol/L (ref 1.15–1.40)
Calcium, Ion: 1.23 mmol/L (ref 1.15–1.40)
Calcium, Ion: 1.13 mmol/L — ABNORMAL LOW (ref 1.15–1.40)
Calcium, Ion: 1.13 mmol/L — ABNORMAL LOW (ref 1.15–1.40)
Calcium, Ion: 1.15 mmol/L (ref 1.15–1.40)
Calcium, Ion: 1.15 mmol/L (ref 1.15–1.40)
Chloride: 103 mmol/L (ref 98–111)
Chloride: 104 mmol/L (ref 98–111)
Chloride: 99 mmol/L (ref 98–111)
Chloride: 103 mmol/L (ref 98–111)
Chloride: 103 mmol/L (ref 98–111)
Chloride: 105 mmol/L (ref 98–111)
Creatinine, Ser: 0.6 mg/dL — ABNORMAL LOW (ref 0.61–1.24)
Creatinine, Ser: 0.6 mg/dL — ABNORMAL LOW (ref 0.61–1.24)
Creatinine, Ser: 0.7 mg/dL (ref 0.61–1.24)
Creatinine, Ser: 0.6 mg/dL — ABNORMAL LOW (ref 0.61–1.24)
Creatinine, Ser: 0.6 mg/dL — ABNORMAL LOW (ref 0.61–1.24)
Creatinine, Ser: 0.6 mg/dL — ABNORMAL LOW (ref 0.61–1.24)
Glucose, Bld: 146 mg/dL — ABNORMAL HIGH (ref 70–99)
Glucose, Bld: 131 mg/dL — ABNORMAL HIGH (ref 70–99)
Glucose, Bld: 145 mg/dL — ABNORMAL HIGH (ref 70–99)
Glucose, Bld: 144 mg/dL — ABNORMAL HIGH (ref 70–99)
Glucose, Bld: 145 mg/dL — ABNORMAL HIGH (ref 70–99)
Glucose, Bld: 119 mg/dL — ABNORMAL HIGH (ref 70–99)
HCT: 40 % (ref 39.0–52.0)
HCT: 38 % — ABNORMAL LOW (ref 39.0–52.0)
HCT: 32 % — ABNORMAL LOW (ref 39.0–52.0)
HCT: 31 % — ABNORMAL LOW (ref 39.0–52.0)
HCT: 33 % — ABNORMAL LOW (ref 39.0–52.0)
HCT: 30 % — ABNORMAL LOW (ref 39.0–52.0)
Hemoglobin: 13.6 g/dL (ref 13.0–17.0)
Hemoglobin: 12.9 g/dL — ABNORMAL LOW (ref 13.0–17.0)
Hemoglobin: 10.9 g/dL — ABNORMAL LOW (ref 13.0–17.0)
Hemoglobin: 10.5 g/dL — ABNORMAL LOW (ref 13.0–17.0)
Hemoglobin: 11.2 g/dL — ABNORMAL LOW (ref 13.0–17.0)
Hemoglobin: 10.2 g/dL — ABNORMAL LOW (ref 13.0–17.0)
Potassium: 3.8 mmol/L (ref 3.5–5.1)
Potassium: 4 mmol/L (ref 3.5–5.1)
Potassium: 4 mmol/L (ref 3.5–5.1)
Potassium: 4.3 mmol/L (ref 3.5–5.1)
Potassium: 4.2 mmol/L (ref 3.5–5.1)
Potassium: 3.7 mmol/L (ref 3.5–5.1)
Sodium: 139 mmol/L (ref 135–145)
Sodium: 140 mmol/L (ref 135–145)
Sodium: 138 mmol/L (ref 135–145)
Sodium: 139 mmol/L (ref 135–145)
Sodium: 140 mmol/L (ref 135–145)
Sodium: 143 mmol/L (ref 135–145)
TCO2: 24 mmol/L (ref 22–32)
TCO2: 24 mmol/L (ref 22–32)
TCO2: 26 mmol/L (ref 22–32)
TCO2: 26 mmol/L (ref 22–32)
TCO2: 26 mmol/L (ref 22–32)
TCO2: 26 mmol/L (ref 22–32)

## 2024-08-25 LAB — CBC
HCT: 33.5 % — ABNORMAL LOW (ref 39.0–52.0)
HCT: 31.3 % — ABNORMAL LOW (ref 39.0–52.0)
Hemoglobin: 11.9 g/dL — ABNORMAL LOW (ref 13.0–17.0)
Hemoglobin: 11.1 g/dL — ABNORMAL LOW (ref 13.0–17.0)
MCH: 32 pg (ref 26.0–34.0)
MCH: 32.1 pg (ref 26.0–34.0)
MCHC: 35.5 g/dL (ref 30.0–36.0)
MCHC: 35.5 g/dL (ref 30.0–36.0)
MCV: 90.1 fL (ref 80.0–100.0)
MCV: 90.5 fL (ref 80.0–100.0)
Platelets: 179 K/uL (ref 150–400)
Platelets: 233 K/uL (ref 150–400)
RBC: 3.72 MIL/uL — ABNORMAL LOW (ref 4.22–5.81)
RBC: 3.46 MIL/uL — ABNORMAL LOW (ref 4.22–5.81)
RDW: 11.6 % (ref 11.5–15.5)
RDW: 11.6 % (ref 11.5–15.5)
WBC: 22.1 K/uL — ABNORMAL HIGH (ref 4.0–10.5)
WBC: 18 K/uL — ABNORMAL HIGH (ref 4.0–10.5)
nRBC: 0 % (ref 0.0–0.2)
nRBC: 0 % (ref 0.0–0.2)

## 2024-08-25 LAB — GLUCOSE, CAPILLARY
Glucose-Capillary: 159 mg/dL — ABNORMAL HIGH (ref 70–99)
Glucose-Capillary: 117 mg/dL — ABNORMAL HIGH (ref 70–99)
Glucose-Capillary: 131 mg/dL — ABNORMAL HIGH (ref 70–99)
Glucose-Capillary: 131 mg/dL — ABNORMAL HIGH (ref 70–99)
Glucose-Capillary: 152 mg/dL — ABNORMAL HIGH (ref 70–99)
Glucose-Capillary: 144 mg/dL — ABNORMAL HIGH (ref 70–99)
Glucose-Capillary: 141 mg/dL — ABNORMAL HIGH (ref 70–99)

## 2024-08-25 LAB — HEMOGLOBIN AND HEMATOCRIT, BLOOD
HCT: 33.5 % — ABNORMAL LOW (ref 39.0–52.0)
Hemoglobin: 12.2 g/dL — ABNORMAL LOW (ref 13.0–17.0)

## 2024-08-25 LAB — POCT I-STAT EG7
Acid-Base Excess: 1 mmol/L (ref 0.0–2.0)
Bicarbonate: 28.3 mmol/L — ABNORMAL HIGH (ref 20.0–28.0)
Calcium, Ion: 1.09 mmol/L — ABNORMAL LOW (ref 1.15–1.40)
HCT: 30 % — ABNORMAL LOW (ref 39.0–52.0)
Hemoglobin: 10.2 g/dL — ABNORMAL LOW (ref 13.0–17.0)
O2 Saturation: 80 %
Potassium: 3.8 mmol/L (ref 3.5–5.1)
Sodium: 137 mmol/L (ref 135–145)
TCO2: 30 mmol/L (ref 22–32)
pCO2, Ven: 56.9 mmHg (ref 44–60)
pH, Ven: 7.305 (ref 7.25–7.43)
pO2, Ven: 50 mmHg — ABNORMAL HIGH (ref 32–45)

## 2024-08-25 LAB — PLATELET COUNT: Platelets: 275 K/uL (ref 150–400)

## 2024-08-25 LAB — ABO/RH: ABO/RH(D): B POS

## 2024-08-25 LAB — PROTIME-INR
INR: 1.4 — ABNORMAL HIGH (ref 0.8–1.2)
Prothrombin Time: 18.1 s — ABNORMAL HIGH (ref 11.4–15.2)

## 2024-08-25 LAB — BASIC METABOLIC PANEL WITH GFR
Anion gap: 10 (ref 5–15)
BUN: 17 mg/dL (ref 6–20)
CO2: 26 mmol/L (ref 22–32)
Calcium: 7.6 mg/dL — ABNORMAL LOW (ref 8.9–10.3)
Chloride: 105 mmol/L (ref 98–111)
Creatinine, Ser: 0.73 mg/dL (ref 0.61–1.24)
GFR, Estimated: >60 mL/min (ref >60)
Glucose, Bld: 158 mg/dL — ABNORMAL HIGH (ref 70–99)
Potassium: 3.9 mmol/L (ref 3.5–5.1)
Sodium: 141 mmol/L (ref 135–145)

## 2024-08-25 LAB — APTT: aPTT: 32 s (ref 24–36)

## 2024-08-25 LAB — MAGNESIUM: Magnesium: 2.2 mg/dL (ref 1.7–2.4)

## 2024-08-25 LAB — FIBRINOGEN: Fibrinogen: 218 mg/dL (ref 210–475)

## 2024-08-25 SURGERY — REPLACEMENT, AORTIC VALVE, OPEN
Anesthesia: General | Site: Chest

## 2024-08-25 MED ORDER — ~~LOC~~ CARDIAC SURGERY, PATIENT & FAMILY EDUCATION
Freq: Once | Status: DC
  Filled 2024-08-25: qty 1

## 2024-08-25 MED ORDER — SODIUM CHLORIDE 0.9 % IV SOLN: INTRAVENOUS | Status: AC

## 2024-08-25 MED ORDER — SODIUM CHLORIDE 0.45 % IV SOLN: INTRAVENOUS | Status: AC | PRN

## 2024-08-25 MED ORDER — CHLORHEXIDINE GLUCONATE 0.12 % MT SOLN
15.0000 mL | OROMUCOSAL | Status: AC
  Administered 2024-08-25: 15:00:00 15 mL via OROMUCOSAL
  Filled 2024-08-25: qty 15

## 2024-08-25 MED ORDER — PROTAMINE SULFATE 10 MG/ML IV SOLN
INTRAVENOUS | Status: AC
  Filled 2024-08-25: qty 25

## 2024-08-25 MED ORDER — LACTATED RINGERS IV SOLN: INTRAVENOUS | Status: DC | PRN

## 2024-08-25 MED ORDER — CLEVIDIPINE BUTYRATE 0.5 MG/ML IV EMUL
INTRAVENOUS | Status: AC
  Filled 2024-08-25: qty 50

## 2024-08-25 MED ORDER — PROPOFOL 10 MG/ML IV BOLUS
INTRAVENOUS | Status: DC | PRN
  Administered 2024-08-25: 08:00:00 100 mg via INTRAVENOUS

## 2024-08-25 MED ORDER — FENTANYL CITRATE (PF) 250 MCG/5ML IJ SOLN
INTRAMUSCULAR | Status: AC
  Filled 2024-08-25: qty 5

## 2024-08-25 MED ORDER — ACETAMINOPHEN 325 MG PO TABS
650.0000 mg | ORAL_TABLET | Freq: Once | ORAL | Status: AC
  Administered 2024-08-25: 18:00:00 650 mg via ORAL
  Filled 2024-08-25: qty 2

## 2024-08-25 MED ORDER — ROCURONIUM BROMIDE 10 MG/ML (PF) SYRINGE
PREFILLED_SYRINGE | INTRAVENOUS | Status: AC
  Filled 2024-08-25: qty 10

## 2024-08-25 MED ORDER — PHENYLEPHRINE 80 MCG/ML (10ML) SYRINGE FOR IV PUSH (FOR BLOOD PRESSURE SUPPORT)
PREFILLED_SYRINGE | INTRAVENOUS | Status: DC | PRN
  Administered 2024-08-25 (×2): 80 ug via INTRAVENOUS

## 2024-08-25 MED ORDER — MIDAZOLAM HCL (PF) 2 MG/2ML IJ SOLN
2.0000 mg | INTRAMUSCULAR | Status: DC | PRN
  Administered 2024-08-25: 15:00:00 2 mg via INTRAVENOUS
  Filled 2024-08-25: qty 2

## 2024-08-25 MED ORDER — ONDANSETRON HCL 4 MG/2ML IJ SOLN
INTRAMUSCULAR | Status: AC
  Filled 2024-08-25: qty 2

## 2024-08-25 MED ORDER — FENTANYL CITRATE (PF) 250 MCG/5ML IJ SOLN
INTRAMUSCULAR | Status: DC | PRN
  Administered 2024-08-25 (×5): 50 ug via INTRAVENOUS
  Administered 2024-08-25: 08:00:00 100 ug via INTRAVENOUS
  Administered 2024-08-25: 09:00:00 50 ug via INTRAVENOUS
  Administered 2024-08-25: 07:00:00 100 ug via INTRAVENOUS
  Administered 2024-08-25 (×2): 50 ug via INTRAVENOUS
  Administered 2024-08-25: 08:00:00 200 ug via INTRAVENOUS
  Administered 2024-08-25 (×4): 50 ug via INTRAVENOUS

## 2024-08-25 MED ORDER — PROTAMINE SULFATE 10 MG/ML IV SOLN
INTRAVENOUS | Status: AC
  Filled 2024-08-25: qty 15

## 2024-08-25 MED ORDER — DEXMEDETOMIDINE HCL IN NACL 400 MCG/100ML IV SOLN
0.0000 ug/kg/h | INTRAVENOUS | Status: DC
  Administered 2024-08-25: 15:00:00 0.7 ug/kg/h via INTRAVENOUS
  Filled 2024-08-25: qty 100

## 2024-08-25 MED ORDER — MIDAZOLAM HCL (PF) 10 MG/2ML IJ SOLN
INTRAMUSCULAR | Status: AC
  Filled 2024-08-25: qty 2

## 2024-08-25 MED ORDER — LACTATED RINGERS IV SOLN: INTRAVENOUS | Status: AC

## 2024-08-25 MED ORDER — PROTAMINE SULFATE 10 MG/ML IV SOLN
INTRAVENOUS | Status: DC | PRN
  Administered 2024-08-25: 13:00:00 400 mg via INTRAVENOUS

## 2024-08-25 MED ORDER — HEPARIN SODIUM (PORCINE) 1000 UNIT/ML IJ SOLN
INTRAMUSCULAR | Status: DC | PRN
  Administered 2024-08-25: 09:00:00 40000 [IU] via INTRAVENOUS

## 2024-08-25 MED ORDER — ALBUMIN HUMAN 5 % IV SOLN
250.0000 mL | INTRAVENOUS | Status: DC | PRN
  Administered 2024-08-25 (×3): 12.5 g via INTRAVENOUS
  Filled 2024-08-25: qty 250

## 2024-08-25 MED ORDER — ORAL CARE MOUTH RINSE: 15.0000 mL | Freq: Once | OROMUCOSAL | Status: AC

## 2024-08-25 MED ORDER — ROCURONIUM BROMIDE 10 MG/ML (PF) SYRINGE
PREFILLED_SYRINGE | INTRAVENOUS | Status: DC | PRN
  Administered 2024-08-25: 12:00:00 50 mg via INTRAVENOUS
  Administered 2024-08-25: 09:00:00 100 mg via INTRAVENOUS
  Administered 2024-08-25: 08:00:00 30 mg via INTRAVENOUS
  Administered 2024-08-25: 08:00:00 70 mg via INTRAVENOUS
  Administered 2024-08-25: 11:00:00 100 mg via INTRAVENOUS

## 2024-08-25 MED ORDER — CHLORHEXIDINE GLUCONATE 0.12 % MT SOLN
15.0000 mL | Freq: Once | OROMUCOSAL | Status: AC
  Administered 2024-08-25: 06:00:00 15 mL via OROMUCOSAL
  Filled 2024-08-25: qty 15

## 2024-08-25 MED ORDER — SODIUM CHLORIDE 0.9 % IV SOLN: INTRAVENOUS | Status: DC | PRN

## 2024-08-25 MED ORDER — METOPROLOL TARTRATE 12.5 MG HALF TABLET
12.5000 mg | ORAL_TABLET | Freq: Two times a day (BID) | ORAL | Status: DC
  Administered 2024-08-26 – 2024-08-30 (×8): 12.5 mg via ORAL
  Filled 2024-08-25 (×8): qty 1

## 2024-08-25 MED ORDER — LACTATED RINGERS IV SOLN: INTRAVENOUS | Status: DC

## 2024-08-25 MED ORDER — OXYCODONE HCL 5 MG PO TABS
5.0000 mg | ORAL_TABLET | ORAL | Status: DC | PRN
  Administered 2024-08-25 – 2024-08-26 (×4): 10 mg via ORAL
  Filled 2024-08-25 (×5): qty 2

## 2024-08-25 MED ORDER — ASPIRIN 81 MG PO CHEW: 324.0000 mg | CHEWABLE_TABLET | Freq: Every day | ORAL | Status: DC

## 2024-08-25 MED ORDER — CHLORHEXIDINE GLUCONATE CLOTH 2 % EX PADS
6.0000 | MEDICATED_PAD | Freq: Every day | CUTANEOUS | Status: DC
  Administered 2024-08-25 – 2024-08-29 (×5): 6 via TOPICAL

## 2024-08-25 MED ORDER — ARTIFICIAL TEARS OPHTHALMIC OINT
TOPICAL_OINTMENT | OPHTHALMIC | Status: DC | PRN
  Administered 2024-08-25: 08:00:00 1 via OPHTHALMIC

## 2024-08-25 MED ORDER — POTASSIUM CHLORIDE 10 MEQ/50ML IV SOLN: 10.0000 meq | INTRAVENOUS | Status: AC

## 2024-08-25 MED ORDER — HEMOSTATIC AGENTS (NO CHARGE) OPTIME
TOPICAL | Status: DC | PRN
  Administered 2024-08-25: 14:00:00 1 via TOPICAL

## 2024-08-25 MED ORDER — NITROGLYCERIN 0.2 MG/ML ON CALL CATH LAB
INTRAVENOUS | Status: DC | PRN
  Administered 2024-08-25: 09:00:00 10 ug via INTRAVENOUS
  Administered 2024-08-25: 13:00:00 40 ug via INTRAVENOUS
  Administered 2024-08-25 (×2): 10 ug via INTRAVENOUS
  Administered 2024-08-25: 15:00:00 40 ug via INTRAVENOUS
  Administered 2024-08-25: 09:00:00 10 ug via INTRAVENOUS

## 2024-08-25 MED ORDER — ASPIRIN 81 MG PO CHEW
324.0000 mg | CHEWABLE_TABLET | Freq: Once | ORAL | Status: AC
  Administered 2024-08-25: 17:00:00 324 mg via ORAL
  Filled 2024-08-25: qty 4

## 2024-08-25 MED ORDER — THROMBIN (RECOMBINANT) 20000 UNITS EX SOLR
CUTANEOUS | Status: DC | PRN
  Administered 2024-08-25: 08:00:00 20000 [IU] via TOPICAL

## 2024-08-25 MED ORDER — BISACODYL 5 MG PO TBEC
10.0000 mg | DELAYED_RELEASE_TABLET | Freq: Every day | ORAL | Status: DC
  Administered 2024-08-26 – 2024-08-27 (×2): 10 mg via ORAL
  Filled 2024-08-25 (×3): qty 2

## 2024-08-25 MED ORDER — MAGNESIUM SULFATE 4 GM/100ML IV SOLN
4.0000 g | Freq: Once | INTRAVENOUS | Status: AC
  Administered 2024-08-25: 15:00:00 4 g via INTRAVENOUS
  Filled 2024-08-25: qty 100

## 2024-08-25 MED ORDER — CEFAZOLIN SODIUM-DEXTROSE 2-4 GM/100ML-% IV SOLN
2.0000 g | Freq: Three times a day (TID) | INTRAVENOUS | Status: AC
  Administered 2024-08-25 – 2024-08-27 (×6): 2 g via INTRAVENOUS
  Filled 2024-08-25 (×6): qty 100

## 2024-08-25 MED ORDER — SODIUM CHLORIDE 0.9 % IV SOLN: 250.0000 mL | INTRAVENOUS | Status: AC

## 2024-08-25 MED ORDER — INSULIN REGULAR(HUMAN) IN NACL 100-0.9 UT/100ML-% IV SOLN
INTRAVENOUS | Status: AC
  Administered 2024-08-25: 15:00:00 2.4 [IU]/h via INTRAVENOUS

## 2024-08-25 MED ORDER — ONDANSETRON HCL 4 MG/2ML IJ SOLN
INTRAMUSCULAR | Status: DC | PRN
  Administered 2024-08-25: 14:00:00 4 mg via INTRAVENOUS

## 2024-08-25 MED ORDER — NOREPINEPHRINE 4 MG/250ML-% IV SOLN
0.0000 ug/min | INTRAVENOUS | Status: DC
  Administered 2024-08-25: 23:00:00 5 ug/min via INTRAVENOUS
  Administered 2024-08-27: 02:00:00 4 ug/min via INTRAVENOUS
  Filled 2024-08-25 (×2): qty 250

## 2024-08-25 MED ORDER — PLASMA-LYTE A IV SOLN
INTRAVENOUS | Status: DC | PRN
  Administered 2024-08-25: 08:00:00 500 mL

## 2024-08-25 MED ORDER — METOPROLOL TARTRATE 25 MG/10 ML ORAL SUSPENSION: 12.5000 mg | Freq: Two times a day (BID) | ORAL | Status: DC

## 2024-08-25 MED ORDER — THROMBIN 20000 UNITS EX SOLR
OROMUCOSAL | Status: DC | PRN
  Administered 2024-08-25 (×3): 4 mL via TOPICAL

## 2024-08-25 MED ORDER — ASPIRIN 325 MG PO TBEC
325.0000 mg | DELAYED_RELEASE_TABLET | Freq: Every day | ORAL | Status: DC
  Administered 2024-08-26 – 2024-08-28 (×3): 325 mg via ORAL
  Filled 2024-08-25 (×3): qty 1

## 2024-08-25 MED ORDER — ALBUMIN HUMAN 5 % IV SOLN: INTRAVENOUS | Status: DC | PRN

## 2024-08-25 MED ORDER — ROSUVASTATIN CALCIUM 20 MG PO TABS
40.0000 mg | ORAL_TABLET | Freq: Every day | ORAL | Status: DC
  Administered 2024-08-26 – 2024-08-30 (×5): 40 mg via ORAL
  Filled 2024-08-25 (×4): qty 2
  Filled 2024-08-25: qty 8

## 2024-08-25 MED ORDER — ONDANSETRON HCL 4 MG/2ML IJ SOLN: 4.0000 mg | Freq: Four times a day (QID) | INTRAMUSCULAR | Status: DC | PRN

## 2024-08-25 MED ORDER — MORPHINE SULFATE (PF) 2 MG/ML IV SOLN
1.0000 mg | INTRAVENOUS | Status: DC | PRN
  Administered 2024-08-25: 4 mg via INTRAVENOUS
  Administered 2024-08-25: 17:00:00 2 mg via INTRAVENOUS
  Administered 2024-08-25 (×2): 4 mg via INTRAVENOUS
  Administered 2024-08-25 – 2024-08-27 (×11): 2 mg via INTRAVENOUS
  Filled 2024-08-25: qty 1
  Filled 2024-08-25 (×2): qty 2
  Filled 2024-08-25: qty 1
  Filled 2024-08-25: qty 2
  Filled 2024-08-25 (×8): qty 1
  Filled 2024-08-25: qty 2

## 2024-08-25 MED ORDER — BISACODYL 10 MG RE SUPP: 10.0000 mg | Freq: Every day | RECTAL | Status: DC

## 2024-08-25 MED ORDER — THROMBIN (RECOMBINANT) 20000 UNITS EX SOLR
CUTANEOUS | Status: AC
  Filled 2024-08-25: qty 20000

## 2024-08-25 MED ORDER — SODIUM BICARBONATE 8.4 % IV SOLN
50.0000 meq | Freq: Once | INTRAVENOUS | Status: AC
  Administered 2024-08-25: 18:00:00 50 meq via INTRAVENOUS
  Filled 2024-08-25: qty 50

## 2024-08-25 MED ORDER — SODIUM BICARBONATE 8.4 % IV SOLN
50.0000 meq | Freq: Once | INTRAVENOUS | Status: AC
  Administered 2024-08-25: 15:00:00 50 meq via INTRAVENOUS

## 2024-08-25 MED ORDER — ACETAMINOPHEN 160 MG/5ML PO SOLN: 650.0000 mg | Freq: Once | ORAL | Status: DC

## 2024-08-25 MED ORDER — SODIUM CHLORIDE 0.9% FLUSH: 3.0000 mL | INTRAVENOUS | Status: DC | PRN

## 2024-08-25 MED ORDER — PANTOPRAZOLE SODIUM 40 MG IV SOLR
40.0000 mg | Freq: Every day | INTRAVENOUS | Status: AC
  Administered 2024-08-25 – 2024-08-26 (×2): 40 mg via INTRAVENOUS
  Filled 2024-08-25 (×2): qty 10

## 2024-08-25 MED ORDER — SUGAMMADEX SODIUM 200 MG/2ML IV SOLN
INTRAVENOUS | Status: AC
  Filled 2024-08-25: qty 2

## 2024-08-25 MED ORDER — EZETIMIBE 10 MG PO TABS
10.0000 mg | ORAL_TABLET | Freq: Every day | ORAL | Status: DC
  Administered 2024-08-26 – 2024-08-30 (×5): 10 mg via ORAL
  Filled 2024-08-25 (×5): qty 1

## 2024-08-25 MED ORDER — TRAMADOL HCL 50 MG PO TABS
50.0000 mg | ORAL_TABLET | ORAL | Status: DC | PRN
  Administered 2024-08-25: 20:00:00 50 mg via ORAL
  Administered 2024-08-26 – 2024-08-28 (×4): 100 mg via ORAL
  Administered 2024-08-29: 50 mg via ORAL
  Administered 2024-08-29: 100 mg via ORAL
  Filled 2024-08-25 (×3): qty 2
  Filled 2024-08-25: qty 1
  Filled 2024-08-25 (×3): qty 2

## 2024-08-25 MED ORDER — SODIUM CHLORIDE 0.9% FLUSH
3.0000 mL | Freq: Two times a day (BID) | INTRAVENOUS | Status: DC
  Administered 2024-08-26 – 2024-08-30 (×7): 3 mL via INTRAVENOUS

## 2024-08-25 MED ORDER — SODIUM CHLORIDE 0.9 % IV SOLN
20.0000 ug | Freq: Once | INTRAVENOUS | Status: AC
  Administered 2024-08-25: 14:00:00 20 ug via INTRAVENOUS
  Filled 2024-08-25: qty 5

## 2024-08-25 MED ORDER — SUGAMMADEX SODIUM 200 MG/2ML IV SOLN
INTRAVENOUS | Status: DC | PRN
  Administered 2024-08-25: 15:00:00 199.6 mg via INTRAVENOUS

## 2024-08-25 MED ORDER — ACETAMINOPHEN 500 MG PO TABS
1000.0000 mg | ORAL_TABLET | Freq: Four times a day (QID) | ORAL | Status: DC
  Administered 2024-08-25 – 2024-08-28 (×9): 1000 mg via ORAL
  Filled 2024-08-25 (×9): qty 2

## 2024-08-25 MED ORDER — METOPROLOL TARTRATE 5 MG/5ML IV SOLN: 2.5000 mg | INTRAVENOUS | Status: DC | PRN

## 2024-08-25 MED ORDER — HEPARIN SODIUM (PORCINE) 1000 UNIT/ML IJ SOLN
INTRAMUSCULAR | Status: AC
  Filled 2024-08-25: qty 10

## 2024-08-25 MED ORDER — METOPROLOL TARTRATE 12.5 MG HALF TABLET
12.5000 mg | ORAL_TABLET | Freq: Once | ORAL | Status: AC
  Administered 2024-08-25: 06:00:00 12.5 mg via ORAL
  Filled 2024-08-25: qty 1

## 2024-08-25 MED ORDER — VANCOMYCIN HCL IN DEXTROSE 1-5 GM/200ML-% IV SOLN
1000.0000 mg | Freq: Once | INTRAVENOUS | Status: AC
  Administered 2024-08-25: 20:00:00 1000 mg via INTRAVENOUS
  Filled 2024-08-25: qty 200

## 2024-08-25 MED ORDER — LIDOCAINE 2% (20 MG/ML) 5 ML SYRINGE
INTRAMUSCULAR | Status: AC
  Filled 2024-08-25: qty 5

## 2024-08-25 MED ORDER — DEXTROSE 50 % IV SOLN: 0.0000 mL | INTRAVENOUS | Status: DC | PRN

## 2024-08-25 MED ORDER — PANTOPRAZOLE SODIUM 40 MG PO TBEC
40.0000 mg | DELAYED_RELEASE_TABLET | Freq: Every day | ORAL | Status: DC
  Administered 2024-08-27 – 2024-08-30 (×4): 40 mg via ORAL
  Filled 2024-08-25 (×4): qty 1

## 2024-08-25 MED ORDER — ACETAMINOPHEN 160 MG/5ML PO SOLN
1000.0000 mg | Freq: Four times a day (QID) | ORAL | Status: DC
  Administered 2024-08-27: 12:00:00 1000 mg
  Filled 2024-08-25: qty 40.6

## 2024-08-25 MED ORDER — HEMOSTATIC AGENTS (NO CHARGE) OPTIME
TOPICAL | Status: DC | PRN
  Administered 2024-08-25 (×3): 1 via TOPICAL

## 2024-08-25 MED ORDER — MIDAZOLAM HCL (PF) 5 MG/ML IJ SOLN
INTRAMUSCULAR | Status: DC | PRN
  Administered 2024-08-25 (×3): 1 mg via INTRAVENOUS
  Administered 2024-08-25: 07:00:00 2 mg via INTRAVENOUS
  Administered 2024-08-25 (×2): 1 mg via INTRAVENOUS
  Administered 2024-08-25: 08:00:00 2 mg via INTRAVENOUS
  Administered 2024-08-25: 13:00:00 1 mg via INTRAVENOUS

## 2024-08-25 MED ORDER — ARTIFICIAL TEARS OPHTHALMIC OINT
TOPICAL_OINTMENT | OPHTHALMIC | Status: AC
  Filled 2024-08-25: qty 3.5

## 2024-08-25 MED ORDER — METOCLOPRAMIDE HCL 5 MG/ML IJ SOLN
10.0000 mg | Freq: Four times a day (QID) | INTRAMUSCULAR | Status: AC
  Administered 2024-08-25 – 2024-08-26 (×6): 10 mg via INTRAVENOUS
  Filled 2024-08-25 (×6): qty 2

## 2024-08-25 MED ORDER — CHLORHEXIDINE GLUCONATE 0.12 % MT SOLN: 15.0000 mL | Freq: Once | OROMUCOSAL | Status: DC

## 2024-08-25 MED ORDER — PROPOFOL 10 MG/ML IV BOLUS
INTRAVENOUS | Status: AC
  Filled 2024-08-25: qty 20

## 2024-08-25 MED ORDER — DOCUSATE SODIUM 100 MG PO CAPS
200.0000 mg | ORAL_CAPSULE | Freq: Every day | ORAL | Status: DC
  Administered 2024-08-26 – 2024-08-28 (×3): 200 mg via ORAL
  Filled 2024-08-25 (×4): qty 2

## 2024-08-25 MED ORDER — CHLORHEXIDINE GLUCONATE 4 % EX SOLN: 30.0000 mL | CUTANEOUS | Status: DC

## 2024-08-25 MED ORDER — HEPARIN SODIUM (PORCINE) 1000 UNIT/ML IJ SOLN
INTRAMUSCULAR | Status: AC
  Filled 2024-08-25: qty 1

## 2024-08-25 SURGICAL SUPPLY — 94 items
ADAPTER CARDIO PERF ANTE/RETRO (ADAPTER) ×2 IMPLANT
ADAPTER MULTI PERFUSION 15 (ADAPTER) ×2 IMPLANT
APPLICATOR COTTON TIP 6 STRL (MISCELLANEOUS) IMPLANT
BAG DECANTER FOR FLEXI CONT (MISCELLANEOUS) ×2 IMPLANT
BLADE CLIPPER SURG (BLADE) ×2 IMPLANT
BLADE STERNUM SYSTEM 6 (BLADE) ×2 IMPLANT
BLADE SURG 15 STRL LF DISP TIS (BLADE) ×2 IMPLANT
BNDG ELASTIC 4X5.8 VLCR STR LF (GAUZE/BANDAGES/DRESSINGS) ×2 IMPLANT
BNDG ELASTIC 6INX 5YD STR LF (GAUZE/BANDAGES/DRESSINGS) ×2 IMPLANT
BNDG GAUZE DERMACEA FLUFF 4 (GAUZE/BANDAGES/DRESSINGS) ×2 IMPLANT
CANISTER SUCTION 3000ML PPV (SUCTIONS) ×2 IMPLANT
CANNULA AORTIC ROOT 9FR (CANNULA) ×2 IMPLANT
CANNULA GUNDRY RCSP 15FR (MISCELLANEOUS) ×2 IMPLANT
CANNULA MC2 2 STG 36/46 NON-V (CANNULA) IMPLANT
CANNULA NON VENT 20FR 12 (CANNULA) IMPLANT
CANNULA VESSEL 3MM BLUNT TIP (CANNULA) ×2 IMPLANT
CATH HEART VENT LEFT (CATHETERS) ×2 IMPLANT
CATH ROBINSON RED A/P 18FR (CATHETERS) ×6 IMPLANT
CATH THOR RT ANG 28F 9128 SOFT (CATHETERS) ×2 IMPLANT
CATH THORACIC 28FR (CATHETERS) ×2 IMPLANT
CATH THORACIC 36FR (CATHETERS) ×2 IMPLANT
CLIP TI MEDIUM 24 (CLIP) IMPLANT
CLIP TI WIDE RED SMALL 24 (CLIP) IMPLANT
CNTNR URN SCR LID CUP LEK RST (MISCELLANEOUS) ×2 IMPLANT
CONTAINER PROTECT SURGISLUSH (MISCELLANEOUS) ×4 IMPLANT
DERMABOND ADVANCED .7 DNX12 (GAUZE/BANDAGES/DRESSINGS) IMPLANT
DEVICE SUT CK QUICK LOAD MINI (Prosthesis & Implant Heart) IMPLANT
DRAIN CHANNEL 28F RND 3/8 FF (WOUND CARE) ×2 IMPLANT
DRAPE SRG 135X102X78XABS (DRAPES) ×2 IMPLANT
DRAPE WARM FLUID 44X44 (DRAPES) ×2 IMPLANT
DRSG COVADERM 4X14 (GAUZE/BANDAGES/DRESSINGS) ×2 IMPLANT
ELECTRODE REM PT RTRN 9FT ADLT (ELECTROSURGICAL) ×4 IMPLANT
FELT TEFLON 1X6 (MISCELLANEOUS) ×2 IMPLANT
GAUZE SPONGE 4X4 12PLY STRL (GAUZE/BANDAGES/DRESSINGS) ×4 IMPLANT
GLOVE BIO SURGEON STRL SZ 6.5 (GLOVE) ×4 IMPLANT
GLOVE BIOGEL PI IND STRL 7.0 (GLOVE) ×4 IMPLANT
GLOVE BIOGEL PI MICRO STRL 7 (GLOVE) ×6 IMPLANT
GOWN STRL REUS W/ TWL LRG LVL3 (GOWN DISPOSABLE) ×10 IMPLANT
GOWN STRL REUS W/ TWL XL LVL3 (GOWN DISPOSABLE) ×4 IMPLANT
HEMOSTAT POWDER SURGIFOAM 1G (HEMOSTASIS) ×6 IMPLANT
HEMOSTAT SURGICEL 2X14 (HEMOSTASIS) ×2 IMPLANT
INSERT FOGARTY XLG (MISCELLANEOUS) ×2 IMPLANT
INSERT SUTURE HOLDER (MISCELLANEOUS) ×2 IMPLANT
KIT BASIN OR (CUSTOM PROCEDURE TRAY) ×2 IMPLANT
KIT SUCTION CATH 14FR (SUCTIONS) ×2 IMPLANT
KIT SUT CK MINI COMBO 4X17 (Prosthesis & Implant Heart) IMPLANT
KIT TURNOVER KIT B (KITS) ×2 IMPLANT
KIT VASOVIEW HEMOPRO 2 VH 4000 (KITS) ×2 IMPLANT
LEAD PACING MYOCARDI (MISCELLANEOUS) IMPLANT
LINE VENT (MISCELLANEOUS) IMPLANT
MARKER GRAFT CORONARY BYPASS (MISCELLANEOUS) ×6 IMPLANT
NDL SUT 4 .5 CRC FRENCH EYE (NEEDLE) IMPLANT
PACK E OPEN HEART (SUTURE) ×2 IMPLANT
PACK OPEN HEART (CUSTOM PROCEDURE TRAY) ×2 IMPLANT
PAD ARMBOARD POSITIONER FOAM (MISCELLANEOUS) ×4 IMPLANT
PAD ELECT DEFIB RADIOL ZOLL (MISCELLANEOUS) ×2 IMPLANT
PENCIL BUTTON HOLSTER BLD 10FT (ELECTRODE) ×2 IMPLANT
POSITIONER HEAD DONUT 9IN (MISCELLANEOUS) ×2 IMPLANT
PUNCH AORTIC ROTATE 4.0MM (MISCELLANEOUS) ×2 IMPLANT
SEALANT PATCH FIBRIN 2X4IN (MISCELLANEOUS) IMPLANT
SET MPS 3-ND DEL (MISCELLANEOUS) IMPLANT
SOLN 0.9% NACL POUR BTL 1000ML (IV SOLUTION) ×10 IMPLANT
SOLN STERILE WATER BTL 1000 ML (IV SOLUTION) ×4 IMPLANT
SUPPORT HEART JANKE-BARRON (MISCELLANEOUS) ×2 IMPLANT
SUT BONE WAX W31G (SUTURE) ×2 IMPLANT
SUT EB EXC GRN/WHT 2-0 V-5 (SUTURE) ×4 IMPLANT
SUT ETHIBON EXCEL 2-0 V-5 (SUTURE) IMPLANT
SUT ETHIBOND 2 0 V4 (SUTURE) IMPLANT
SUT ETHIBOND 2 0 V5 (SUTURE) IMPLANT
SUT ETHIBOND 2 0V4 GREEN (SUTURE) IMPLANT
SUT ETHIBOND V-5 VALVE (SUTURE) IMPLANT
SUT ETHIBOND X763 2 0 SH 1 (SUTURE) ×8 IMPLANT
SUT MNCRL AB 4-0 PS2 18 (SUTURE) IMPLANT
SUT PROLENE 4 0 SH DA (SUTURE) ×2 IMPLANT
SUT PROLENE 4-0 RB1 .5 CRCL 36 (SUTURE) ×2 IMPLANT
SUT PROLENE 6 0 C 1 30 (SUTURE) ×4 IMPLANT
SUT PROLENE 7 0 BV1 MDA (SUTURE) ×2 IMPLANT
SUT SILK 1 MH (SUTURE) IMPLANT
SUT STEEL 6MS V (SUTURE) ×2 IMPLANT
SUT STEEL STERNAL CCS#1 18IN (SUTURE) IMPLANT
SUT STEEL SZ 6 DBL 3X14 BALL (SUTURE) ×4 IMPLANT
SUT VIC AB 1 CTX36XBRD ANBCTR (SUTURE) ×4 IMPLANT
SUT VIC AB 2-0 CT1 TAPERPNT 27 (SUTURE) ×4 IMPLANT
SUT VIC AB 2-0 CTX 27 (SUTURE) IMPLANT
SUT VIC AB 3-0 X1 27 (SUTURE) IMPLANT
SYSTEM SAHARA CHEST DRAIN ATS (WOUND CARE) ×2 IMPLANT
TOWEL GREEN STERILE (TOWEL DISPOSABLE) ×2 IMPLANT
TOWEL GREEN STERILE FF (TOWEL DISPOSABLE) ×2 IMPLANT
TRAY FOLEY SLVR 16FR TEMP STAT (SET/KITS/TRAYS/PACK) ×2 IMPLANT
TUBE SUCT INTRACARD DLP 20F (MISCELLANEOUS) ×2 IMPLANT
TUBE SUCTION CARDIAC 10FR (CANNULA) ×2 IMPLANT
TUBING LAP HI FLOW INSUFFLATIO (TUBING) ×2 IMPLANT
UNDERPAD 30X36 HEAVY ABSORB (UNDERPADS AND DIAPERS) ×2 IMPLANT
VALVE ON-X AORTIC 23MM (Prosthesis & Implant Heart) IMPLANT

## 2024-08-25 NOTE — Anesthesia Procedure Notes (Signed)
 Procedure Name: Intubation Date/Time: 08/25/2024 7:47 AM  Performed by: Harrold Macintosh, CRNAPre-anesthesia Checklist: Patient identified, Emergency Drugs available, Suction available and Patient being monitored Patient Re-evaluated:Patient Re-evaluated prior to induction Oxygen Delivery Method: Circle system utilized Preoxygenation: Pre-oxygenation with 100% oxygen Induction Type: IV induction Ventilation: Mask ventilation without difficulty Laryngoscope Size: Miller and 3 Grade View: Grade I Tube type: Oral Tube size: 8.0 mm Number of attempts: 1 Airway Equipment and Method: Stylet Placement Confirmation: ETT inserted through vocal cords under direct vision, positive ETCO2 and breath sounds checked- equal and bilateral Secured at: 24 cm Tube secured with: Tape Dental Injury: Teeth and Oropharynx as per pre-operative assessment

## 2024-08-25 NOTE — Interval H&P Note (Signed)
 History and Physical Interval Note:  08/25/2024 6:38 AM  Krystal LABOR Marling  has presented today for surgery, with the diagnosis of SEVERE AS CADS.  The various methods of treatment have been discussed with the patient and family. After consideration of risks, benefits and other options for treatment, the patient has consented to  Procedures: REPLACEMENT, AORTIC VALVE, OPEN (N/A) CORONARY ARTERY BYPASS GRAFTING (CABG) (N/A) ECHOCARDIOGRAM, TRANSESOPHAGEAL, INTRAOPERATIVE (N/A) as a surgical intervention.  The patient's history has been reviewed, patient examined, no change in status, stable for surgery.  I have reviewed the patient's chart and labs.  Questions were answered to the patient's satisfaction.     Con RAMAN Norely Schlick

## 2024-08-25 NOTE — Progress Notes (Signed)
°  Echocardiogram Echocardiogram Transesophageal has been performed.  Chris Skinner 08/25/2024, 9:15 AM

## 2024-08-25 NOTE — Hospital Course (Signed)
°  Referring: [name], * Primary Care: [name], MD Primary Cardiologist:[place] ONEIDA Barbaraann, MD   History of Present Illness:    At time of CT surgical evaluation OSIAS RESNICK is a 58 y.o. male who presents for surgical evaluation of severe aortic stenosis and coronary artery disease.  He has a history of HL and active tobacco use (smokes 1 pack per week).  He is enrolled in a drug study for a cholesterol medication by [employer] and is unsure if he's on the experimental medication or placebo.    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  He denies dyspnea, chest pain, dizziness, syncope, leg swelling or palpitations but does have progressively worsening fatigue.  He works out, plays golf and walks 5-7 miles a day at work.  He lives in a house with his mother who has dementia and is not able bodied.  During his recovery, his sister will be staying with him.  He has been found to have what was felt to be a bicuspid aortic valve with mild AI and severe aortic stenosis.  He also was found to have complete occlusion of the RCA.  He has recently developed increasing fatigue.  Heart function by echocardiogram is found to be preserved.  Dr. Daniel  evaluated the patient and reviewed all relevant studies and it was her recommendation to proceed with surgery on 1215 for AVR/CABG x 1.  The patient was admitted electively for the procedure.  Hospital course:  Patient was admitted electively and on 08/25/2024 taken the operating room at which time he underwent aortic valve replacement with a Onyx mechanical #23 chemical prosthesis.  The RCA was evaluated and there was not felt to be a suitable region on the artery for bypass.  The patient tolerated the procedure well and was taken to the surgical intensive care unit in stable condition.  Postoperative hospital course:  The patient has done well.  He was extubated without difficulty using standard postcardiac surgical  protocols.  He initially did require some atrial pacing for assistance with blood pressure.  He was initially in a sinus rhythm but has had postoperative atrial fibrillation with RVR.  He was placed on a amiodarone  drip.  He has required a routine diuresis.  The does have expected acute blood loss anemia which is minor and being monitored clinically.  Renal function has remained within normal limits.  Chest tubes were removed on postop day #2.  Foley, arterial line and central lines were removed on postop day #2.  He started on routine progression using standard cardiac rehab modalities.  Pain has been under good control with multimodal management.

## 2024-08-25 NOTE — Consult Note (Addendum)
 NAME:  Chris Skinner, MRN:  [id number], DOB:  1966/03/22, LOS: 0 ADMISSION DATE:  08/25/2024 CONSULTATION DATE:  08/25/2024 REFERRING MD:  Dr. Daniel CHIEF COMPLAINT:  CAD   History of Present Illness:  Seen in consultation at the request of Dr. Daniel (TCTS) for management of mechanical ventilation and hemodynamics post-CABG.  58 year old male who presented to [facility] 08/25/2024 for scheduled CABG. PMHx significant for severe aortic stenosis and coronary artery disease, OSA on home CPAP, with active tobacco use, Hyperlipidemia, Type 2 DM. Per CTS documentation He has a bicuspid aortic valve with mild AI and severe AS. 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 His main symptom is fatigue. His heart function is preserved. Planned CABG, no good targets to complete, underwent aortic valve replacement with OnX mechanical. Intra-op given DDAVP  and TXA for oozing. EBI 667.   On arrival to ICU patient on 2 mcg/min levophed . Has received albumin  x 2. CO 5.5, CI 2.5. Precedex  on 0.7. Given 2 mg versed . Pacerwires in place. On DDD at 39. Underlying sinus bradycardia.   Paralytic reversed by CRNA on arrival.   Pertinent Medical History:  Severe aortic stenosis and coronary artery disease, OSA on home CPAP, with active tobacco use, Hyperlipidemia, Type 2 DM.  Significant Hospital Events: Including procedures, antibiotic start and stop dates in addition to other pertinent events   AVR  Interim History / Subjective:  PCCM consulted for vent management post-CABG.  Objective:  Blood pressure (!) 131/92, pulse 60, temperature 97.8 F (36.6 C), temperature source Oral, resp. rate 10, height 6' 1.5 (1.867 m), weight 99.8 kg, SpO2 97%.        Intake/Output Summary (Last 24 hours) at 08/25/2024 1108 Last data filed at 08/25/2024 1030 Gross per 24 hour  Intake 1800 ml  Output 700 ml  Net 1100 ml   Filed Weights   08/25/24 0600  Weight: 99.8 kg    Physical Examination: General: Adult male on vent   HEENT: OG, ETT in place  Neuro: Sedated.  CV: Paced, heart rate 88, pacerwires in place to pacer box, mediastinal chest tubes noted  PULM: vent assisted breaths, clear breath sounds  GI: Soft, nondistended. Normoactive bowel sounds. Extremities: -edema  Skin: Warm/dry, right lower leg wrapping in place.  Resolved Hospital Problem List:    Assessment & Plan:   Postoperative vent management Hx OSA with CPAP at HS Active Tobacco Use  - Continue full vent support (4-8cc/kg IBW) - Wean FiO2 for O2 sat > 90% - Daily WUA/SBT, rapid wean with SIMV per protocol - VAP bundle - Pulmonary hygiene - F/u ABG - PAD protocol for sedation: Precedex  for goal RASS 0 to -1  Severe Aortic Stenosis s/p AVR CAD Hyperlipidemia  - Monitor CI, CO - Maintain MAP >65 (currently on 2 mcg levophed ). Received 3rd albumin  now  - Pacer set to 69  - Continue ASA - Monitor Chest tube output  - Vancomycin  x 1, Ancef  for surgical ppx  - Continue Zetia   - Postoperative care per TCTS - CT management per protocol  Type 2 DM - Trend glucose - Currently on insulin  gtt with goal 110-140   Best Practice: (right click and Reselect all SmartList Selections daily)   Diet/type: NPO DVT prophylaxis: SCD GI prophylaxis: PPI Lines: Central line, Arterial Line, and yes and it is still needed Foley:  Yes, and it is still needed Code Status:  full code Last date of multidisciplinary goals of care discussion [pending]  Labs:  CBC: Recent  Labs  Lab 08/21/24 1057 08/25/24 0758 08/25/24 0910 08/25/24 0934 08/25/24 0945 08/25/24 1005 08/25/24 1039  WBC 10.6*  --   --   --   --   --   --   HGB 17.1*   < > 12.9* 10.9* 10.2* 10.9* 10.9*  HCT 48.4   < > 38.0* 32.0* 30.0* 32.0* 32.0*  MCV 91.0  --   --   --   --   --   --   PLT 300  --   --   --   --   --   --    < > = values in this interval not displayed.    Basic Metabolic Panel: Recent Labs  Lab 08/21/24 1057 08/25/24 0758 08/25/24 0801  08/25/24 0910 08/25/24 0934 08/25/24 0945 08/25/24 1005 08/25/24 1039  NA 138 139   < > 140 138 137 138 138  K 4.1 3.8   < > 4.0 4.4 3.8 4.0 4.3  CL 100 103  --  104  --   --  99  --   CO2 30  --   --   --   --   --   --   --   GLUCOSE 124* 146*  --  131*  --   --  145*  --   BUN 18 18  --  17  --   --  18  --   CREATININE 0.84 0.60*  --  0.60*  --   --  0.70  --   CALCIUM  9.5  --   --   --   --   --   --   --    < > = values in this interval not displayed.   GFR: Estimated Creatinine Clearance: 123.1 mL/min (by C-G formula based on SCr of 0.7 mg/dL). Recent Labs  Lab 08/21/24 1057  WBC 10.6*    Liver Function Tests: Recent Labs  Lab 08/21/24 1057  AST 30  ALT 27  ALKPHOS 46  BILITOT 1.1  PROT 7.3  ALBUMIN  4.4   No results for input(s): LIPASE, AMYLASE in the last 168 hours. No results for input(s): AMMONIA in the last 168 hours.  ABG    Component Value Date/Time   PHART 7.369 08/25/2024 1039   PCO2ART 41.3 08/25/2024 1039   PO2ART 371 (H) 08/25/2024 1039   HCO3 23.8 08/25/2024 1039   TCO2 25 08/25/2024 1039   ACIDBASEDEF 1.0 08/25/2024 1039   O2SAT 100 08/25/2024 1039     Coagulation Profile: Recent Labs  Lab 08/21/24 1057  INR 1.0    Cardiac Enzymes: No results for input(s): CKTOTAL, CKMB, CKMBINDEX, TROPONINI in the last 168 hours.  HbA1C: Hemoglobin A1C  Date/Time Value Ref Range Status  03/25/2024 09:12 AM 6.6 (A) 4.0 - 5.6 % Final  09/25/2023 03:36 PM 6.1 (A) 4.0 - 5.6 % Final   Hgb A1c MFr Bld  Date/Time Value Ref Range Status  08/21/2024 10:58 AM 6.0 (H) 4.8 - 5.6 % Final    Comment:    (NOTE) Diagnosis of Diabetes The following HbA1c ranges recommended by the American Diabetes Association (ADA) may be used as an aid in the diagnosis of diabetes mellitus.  Hemoglobin             Suggested A1C NGSP%              Diagnosis  <5.7  Non Diabetic  5.7-6.4                Pre-Diabetic  >6.4                    Diabetic  <7.0                   Glycemic control for                       adults with diabetes.    06/18/2017 01:12 PM 5.3 <5.7 % of total Hgb Final    Comment:    For the purpose of screening for the presence of diabetes: . <5.7%       Consistent with the absence of diabetes 5.7-6.4%    Consistent with increased risk for diabetes             (prediabetes) > or =6.5%  Consistent with diabetes . This assay result is consistent with a decreased risk of diabetes. . Currently, no consensus exists regarding use of hemoglobin A1c for diagnosis of diabetes in children. . According to American Diabetes Association (ADA) guidelines, hemoglobin A1c <7.0% represents optimal control in non-pregnant diabetic patients. Different metrics may apply to specific patient populations.  Standards of Medical Care in Diabetes(ADA). .     CBG: Recent Labs  Lab 08/21/24 1016 08/25/24 0603  GLUCAP 111* 159*    Review of Systems:   Unable to review, intubated/sedated  Past Medical History:  He,  has a past medical history of Chest pain (2001), Coronary artery disease, Diabetes mellitus without complication (HCC), Dyslipidemia, H/O echocardiogram (09/2013), Heart murmur, Hyperlipidemia, Hypertension, Personal history of colonic polyp - adenoma (01/05/2014), Sleep apnea, Tobacco use disorder, and Wears glasses.   Surgical History:   Past Surgical History:  Procedure Laterality Date   BIOPSY  01/17/2021   Procedure: BIOPSY;  Surgeon: Aneita Gwendlyn DASEN, MD;  Location[facility] ENDOSCOPY;  Service: Endoscopy;;   COLONOSCOPY  2015   CORONARY ANGIOGRAPHY N/A 08/05/2024   Procedure: CORONARY ANGIOGRAPHY;  Surgeon: Wonda Sharper, MD;  Location[facility] INVASIVE CV LAB;  Service: Cardiovascular;  Laterality: N/A;   ESOPHAGOGASTRODUODENOSCOPY (EGD) WITH PROPOFOL  N/A 01/17/2021   Procedure: ESOPHAGOGASTRODUODENOSCOPY (EGD) WITH PROPOFOL ;  Surgeon: Aneita Gwendlyn DASEN, MD;  Location: [facility] ENDOSCOPY;  Service:  Endoscopy;  Laterality: N/A;   FOREIGN BODY REMOVAL  01/17/2021   Procedure: FOREIGN BODY REMOVAL;  Surgeon: Aneita Gwendlyn DASEN, MD;  Location: [facility] ENDOSCOPY;  Service: Endoscopy;;   KNEE ARTHROSCOPY Left 1985   TONSILLECTOMY AND ADENOIDECTOMY  1974     Social History:   reports that he has quit smoking. His smoking use included cigarettes. He quit smokeless tobacco use about 16 years ago.  His smokeless tobacco use included chew. He reports current alcohol use of about 10.0 standard drinks of alcohol per week. He reports that he does not use drugs.   Family History:  His family history includes Arthritis in his mother; Crohn's disease in his sister; Diabetes in his father and paternal grandfather; Heart disease in his father and mother; Hyperlipidemia in his mother; Stroke in his mother. There is no history of Cancer, Colon cancer, Colon polyps, Esophageal cancer, Stomach cancer, or Rectal cancer.   Allergies: Allergies[1]   Home Medications: Prior to Admission medications  Medication Sig Start Date End Date Taking? Authorizing Provider  amLODipine  (NORVASC ) 5 MG tablet TAKE 1 TABLET (5 MG TOTAL) BY MOUTH DAILY. 06/25/24  Yes [name], MD  aspirin  EC 81 MG tablet Take 1 tablet (81 mg total) by mouth daily. Swallow whole. 07/29/24  Yes O'Neal, Darryle Ned, MD  cetirizine (ZYRTEC) 10 MG chewable tablet Chew 10 mg by mouth in the morning.   Yes [provider]  dapagliflozin  propanediol (FARXIGA ) 5 MG TABS tablet Take 1 tablet (5 mg total) by mouth daily before breakfast. 05/30/24  Yes [name], MD  ezetimibe  (ZETIA ) 10 MG tablet TAKE 1 TABLET BY MOUTH EVERY DAY 06/25/24  Yes [name], MD  Investigational - Study Medication Take 1 tablet by mouth daily. Study name:  PURSUIT Phase IIb trial for [employer]'s [id number] Additional study details: [id number] 30 mg or placebo   Yes [provider]  lisinopril -hydrochlorothiazide  (ZESTORETIC ) 20-12.5 MG tablet TAKE 1  TABLET BY MOUTH EVERY DAY 06/25/24  Yes Lalonde, John C, MD  Multiple Vitamins-Minerals (MULTIVITAMIN WITH MINERALS) tablet Take 2 tablets by mouth in the morning.   Yes [provider]  Naproxen Sodium 220 MG CAPS Take 440 mg by mouth in the morning.   Yes [provider]  rosuvastatin  (CRESTOR ) 40 MG tablet TAKE 1 TABLET BY MOUTH EVERY DAY 06/25/24  Yes [name], MD     Critical care time: 45 minutes   CRITICAL CARE Performed by: Dennis CHRISTELLA An   Total critical care time: 45 minutes  Critical care time was exclusive of separately billable procedures and treating other patients.  Critical care was necessary to treat or prevent imminent or life-threatening deterioration.  Critical care was time spent personally by me on the following activities: development of treatment plan with patient and/or surrogate as well as nursing, discussions with consultants, evaluation of patient's response to treatment, examination of patient, obtaining history from patient or surrogate, ordering and performing treatments and interventions, ordering and review of laboratory studies, ordering and review of radiographic studies, pulse oximetry and re-evaluation of patient's condition.     [1] No Known Allergies

## 2024-08-25 NOTE — Anesthesia Preprocedure Evaluation (Signed)
 Anesthesia Evaluation  Patient identified by MRN, date of birth, ID band Patient awake    Reviewed: Allergy & Precautions, H&P , NPO status , Patient's Chart, lab work & pertinent test results  Airway Mallampati: II   Neck ROM: full    Dental   Pulmonary sleep apnea , Patient abstained from smoking., former smoker   breath sounds clear to auscultation       Cardiovascular hypertension, + CAD  + Valvular Problems/Murmurs AS  Rhythm:regular  TTE (07/23/24): EF 60%, severe AS with mean PG 54 mmHg, AVA 0.83 cm2.   Neuro/Psych    GI/Hepatic   Endo/Other  diabetes, Type 2    Renal/GU      Musculoskeletal   Abdominal   Peds  Hematology   Anesthesia Other Findings   Reproductive/Obstetrics                              Anesthesia Physical Anesthesia Plan  ASA: 3  Anesthesia Plan: General   Post-op Pain Management:    Induction: Intravenous  PONV Risk Score and Plan: 2 and Ondansetron , Dexamethasone, Midazolam  and Treatment may vary due to age or medical condition  Airway Management Planned: Oral ETT  Additional Equipment: Arterial line, CVP, PA Cath, TEE and Ultrasound Guidance Line Placement  Intra-op Plan:   Post-operative Plan: Post-operative intubation/ventilation  Informed Consent: I have reviewed the patients History and Physical, chart, labs and discussed the procedure including the risks, benefits and alternatives for the proposed anesthesia with the patient or authorized representative who has indicated his/her understanding and acceptance.     Dental advisory given  Plan Discussed with: CRNA, Anesthesiologist and Surgeon  Anesthesia Plan Comments:         Anesthesia Quick Evaluation

## 2024-08-25 NOTE — Anesthesia Procedure Notes (Signed)
 Central Venous Catheter Insertion Performed by: Maryclare Cornet, MD, anesthesiologist Start/End12/15/2025 6:55 AM, 08/25/2024 7:13 AM Patient location: Pre-op. Preanesthetic checklist: patient identified, IV checked, site marked, risks and benefits discussed, surgical consent, monitors and equipment checked, pre-op evaluation, timeout performed and anesthesia consent Lidocaine  1% used for infiltration and patient sedated Hand hygiene performed  and maximum sterile barriers used  Catheter size: 8.5 Fr Sheath introducer Procedure performed using ultrasound to evaluate access site. Ultrasound Notes:relevant anatomy identified, ultrasound used to visualize needle entry, vessel patent under ultrasound and image(s) printed for medical record. Attempts: 1 Following insertion, line sutured and dressing applied. Post procedure assessment: blood return through all ports, free fluid flow and no air  Patient tolerated the procedure well with no immediate complications.

## 2024-08-25 NOTE — Transfer of Care (Signed)
 Immediate Anesthesia Transfer of Care Note  Patient: Chris Skinner  Procedure(s) Performed: REPLACEMENT, AORTIC VALVE, OPEN USING On-X PROSTHETIC HEART VALVE (Chest) ENDOSCOPICALLY HARVESTED RIGHT GREATER SAPHENOUS VEIN (Chest) ECHOCARDIOGRAM, TRANSESOPHAGEAL, INTRAOPERATIVE  Patient Location: SICU  Anesthesia Type:General  Level of Consciousness: sedated and Patient remains intubated per anesthesia plan  Airway & Oxygen Therapy: Patient remains intubated per anesthesia plan and Patient placed on Ventilator (see vital sign flow sheet for setting)  Post-op Assessment: Report given to RN and Post -op Vital signs reviewed and stable  Post vital signs: Reviewed and stable  Last Vitals:  Vitals Value Taken Time  BP 94/60   Temp 37.0   Pulse 69 08/25/24 14:58  Resp 22 08/25/24 14:58  SpO2 93 % 08/25/24 14:58  Vitals shown include unfiled device data.  Last Pain:  Vitals:   08/25/24 0607  TempSrc:   PainSc: 0-No pain      Patients Stated Pain Goal: 0 (08/25/24 9392)  Complications: No notable events documented.

## 2024-08-25 NOTE — Telephone Encounter (Signed)
 FMLA form completed and faxed to [employer] Leave of Absence/Jessica @[phone]. Beginning LOA 08/25/24 through 11/24/24./ DOS 08/25/24

## 2024-08-25 NOTE — Brief Op Note (Signed)
 08/25/2024  2:31 PM  PATIENT:  Chris Skinner  58 y.o. male  PRE-OPERATIVE DIAGNOSIS:  SEVERE AS CADS  POST-OPERATIVE DIAGNOSIS:  SEVERE ASCADS  PROCEDURE:  Procedures: REPLACEMENT, AORTIC VALVE, OPEN USING On-X PROSTHETIC HEART VALVE (N/A) ENDOSCOPICALLY HARVESTED RIGHT GREATER SAPHENOUS VEIN (N/A) ECHOCARDIOGRAM, TRANSESOPHAGEAL, INTRAOPERATIVE (N/A) Vein harvest time: Vein prep time: #23 OnX mechanical AVR  SURGEON:  Surgeons and Role:    * Su, Con RAMAN, MD - Primary     PHYSICIAN ASSISTANT: Yarielis Funaro PA-C  ASSISTANTS: JEREL FEES RNFA   ANESTHESIA:   general  EBL:  677 mL   BLOOD ADMINISTERED:none  DRAINS: MEDIASTINAL CHEST TUBES   LOCAL MEDICATIONS USED:  NONE  SPECIMEN:  Source of Specimen:  AORTIC VALVE LEAFLETS  DISPOSITION OF SPECIMEN:  PATHOLOGY  COUNTS:  YES  TOURNIQUET:  * No tourniquets in log *  DICTATION: .Dragon Dictation  PLAN OF CARE: Admit to inpatient   PATIENT DISPOSITION:  ICU - intubated and hemodynamically stable.   Delay start of Pharmacological VTE agent (>24hrs) due to surgical blood loss or risk of bleeding: yes  COMPLICATIONS: NO KNOWN

## 2024-08-25 NOTE — Anesthesia Procedure Notes (Addendum)
 Arterial Line Insertion Start/End12/15/2025 6:40 AM, 08/25/2024 6:45 AM Performed by: Zelphia Norleen HERO, CRNA, CRNA  Patient location: Pre-op. Preanesthetic checklist: patient identified, IV checked, site marked, risks and benefits discussed, surgical consent, monitors and equipment checked, pre-op evaluation, timeout performed and anesthesia consent Left, radial was placed Catheter size: 20 G Hand hygiene performed  and maximum sterile barriers used  Allen's test indicative of satisfactory collateral circulation Attempts: 1 Procedure performed using ultrasound to evaluate access site. Ultrasound Notes:relevant anatomy identified, ultrasound used to visualize needle entry, vessel patent under ultrasound and image(s) printed for medical record. Following insertion, Biopatch and dressing applied. Post procedure assessment: unchanged and normal  Patient tolerated the procedure well with no immediate complications.

## 2024-08-25 NOTE — Procedures (Signed)
 Extubation Procedure Note  Patient Details:   Name: Chris Skinner DOB: 1966-08-01 MRN: [id number]   Airway Documentation:    Vent end date: 08/25/24 Vent end time: 1545   Evaluation  O2 sats: currently acceptable Complications: No apparent complications Patient did tolerate procedure well. Bilateral Breath Sounds: Coarse crackles   Yes  Patient extubated per MD order due to patient having increased agitation despite efforts of medication administration given.  Positive cuff leak noted.  No evidence of stridor.  Patient able to speak post extubation.  Sats and vitals are currently acceptable.  Will continue to monitor.   [name] 08/25/2024, 3:50 PM

## 2024-08-25 NOTE — Op Note (Signed)
 08/25/2024 [name] [id number]  Surgeon:  Con Clunes, MD  First Assistant: Lemond Cera,  [facility]   Preoperative Diagnosis:  Severe aortic stenosis, single vessel coronary artery disease (RCA CTO)   Postoperative Diagnosis:  Same   Procedure:  Median Sternotomy Extracorporeal circulation 3.   Aortic valve replacement using a 23 mm On-X valve. 4.   Right leg endoscopic vein harvest Adriana Cera, PA-C)  Anesthesia:  General Endotracheal   Clinical History/Surgical Indication:  Chris Skinner is a 58 y.o. male who presents for surgical evaluation of severe aortic stenosis and coronary artery disease.  He has a history of HL and active tobacco use (smokes 1 pack per week).  He is enrolled in a drug study for a cholesterol medication by [employer] and is unsure if he's on the experimental medication or placebo.    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  He denies dyspnea, chest pain, dizziness, syncope, leg swelling or palpitations but does have progressively worsening fatigue.  He works out, plays golf and walks 5-7 miles a day at work.  He lives in a house with his mother who has dementia and is not able bodied.  During his recovery, his sister will be staying with him.   LHC (08/05/24): CTO of mid RCA with left-to-right collaterals TTE (07/23/24): LVEF 60-65%, Normal RV function.  Bicuspid aortic valve with mild AI, severe AS (MG 54, AVA 0.83, Vmax 4.71), no MR, no MS, severe MAC. No regional WMA. Chest CT (07/27/24): CTO of RCA, no significant aortic calcium .  Annular area: 499, sinuses 33-36 mm, ascending aorta 35 mm PFT (TBD): Pending Carotid US  (07/31/24): Right: 1-39%, Left: 1-39%  Preparation:  The patient was seen in the preoperative holding area and the correct patient, correct operation were confirmed with the patient after reviewing the medical record and catheterization. The consent was signed by me. Preoperative antibiotics were given. A pulmonary  arterial line and radial arterial line were placed by the anesthesia team. The patient was taken back to the operating room and positioned supine on the operating room table. After being placed under general endotracheal anesthesia by the anesthesia team a foley catheter was placed. The neck, chest, abdomen, and both legs were prepped with betadine soap and solution and draped in the usual sterile manner. A surgical time-out was taken and the correct patient and operative procedure were confirmed with the nursing and anesthesia staff.   Pre-bypass TEE:   Complete TEE assessment was performed by Dr. Maryclare. Severe AS, mild AI, normal biventricular function    Post-bypass TEE:   Normal functioning prosthetic aortic valve with no perivalvular leak or regurgitation through the valve. Left ventricular function preserved. Mild mitral regurgitation.  Endoscopic vein harvest:  Right greater saphenous vein was harvest by Lemond Cera, PA.  Cardiopulmonary Bypass:  A median sternotomy was performed. The pericardium was opened in the midline. Right ventricular function appeared normal. The ascending aorta was of normal size and had no palpable plaque. There were no contraindications to aortic cannulation or cross-clamping. The patient was fully systemically heparinized and the ACT was maintained > 400 sec. The proximal aortic arch was cannulated with a 22 F aortic cannula for arterial inflow. Venous cannulation was performed via the right atrial appendage using a two-staged venous cannula. An antegrade cardioplegia/vent cannula was inserted into the mid-ascending aorta. A left ventricular vent was placed via the right superior pulmonary vein. A retrograde cardioplegia cannnula was placed into the coronary sinus  via the right atrium.   The right coronary artery, PDA and PL were examined.  The PDA and PL were too small to bypass and the distal RCA was heavily calcified.  There was no suitable target for  bypass so coronary artery bypass grafting was not completed.    Aortic occlusion was performed with a single cross-clamp. Systemic cooling to 32 degrees Centigrade and topical cooling of the heart with iced saline were used. Hyperkalemic antegrade cold blood cardioplegia was used to induce diastolic arrest and then cold blood retrograde cardioplegia was given at about 20 minute intervals throughout the period of arrest to maintain myocardial temperature at or below 10 degrees centigrade. A temperature probe was inserted into the interventricular septum and an insulating pad was placed in the pericardium. Carbon dioxide was insufflated into the pericardium at 5L/min throughout the procedure to minimize intracardiac air.   Aortic Valve Replacement:   A transverse aortotomy was performed 1 cm above the take-off of the right coronary artery. The native valve was actually tricuspid with calcified leaflets and severe annular calcification. The ostia of the coronary arteries were in normal position and were not obstructed. The native valve leaflets were excised and the annulus was decalcified with rongeurs. Care was taken to remove all particulate debris. The left ventricle was directly inspected for debris and then irrigated with ice saline solution. The annulus was sized and a size 23 mm On-X valve was chosen. The model number was [facility]  and the serial number was [id number]. While the valve was being prepared 2-0 Ethibond non-pledgeted horizontal mattress sutures were placed around the annulus with the pledgets in a sub-annular position. The sutures were placed through the sewing ring and the valve lowered into place. The sutures were tied sequentially with Cor-Knot. The valve seated nicely and the coronary ostia were not obstructed. The prosthetic valve leaflets moved normally and there was no sub-valvular obstruction. The aortotomy was closed using 4-0 Prolene suture in 2 layers .  Completion:  The patient  was rewarmed to 37 degrees Centigrade. De-airing maneuvers were performed and the head placed in trendelenburg position. The crossclamp was removed with a time of 140 minutes. There was spontaneous return of sinus rhythm. The aortotomy was checked for hemostasis. Two temporary epicardial pacing wires were placed on the right atrium and two on the right ventricle. The left ventricular vent and retrograde cardioplegia cannulas were removed. The patient was weaned from CPB without difficulty on no inotropes. CPB time was 192 minutes. Heparin  was fully reversed with protamine  and the aortic and venous cannulas removed. Hemostasis was achieved. Mediastinal drainage tubes were placed. The sternum was closed with double #6 stainless steel wires. The fascia was closed with continuous # 1 vicryl suture. The subcutaneous tissue was closed with 2-0 vicryl continuous suture. The skin was closed with 3-0 vicryl subcuticular suture. All sponge, needle, and instrument counts were reported correct at the end of the case. Dry sterile dressings were placed over the incisions and around the chest tubes which were connected to pleurevac suction. The patient was then transported to the surgical intensive care unit in critical but stable condition.

## 2024-08-26 ENCOUNTER — Inpatient Hospital Stay (HOSPITAL_COMMUNITY)

## 2024-08-26 ENCOUNTER — Encounter (HOSPITAL_COMMUNITY): Payer: Self-pay

## 2024-08-26 ENCOUNTER — Other Ambulatory Visit: Payer: Self-pay | Admitting: Cardiology

## 2024-08-26 DIAGNOSIS — Z952 Presence of prosthetic heart valve: Secondary | ICD-10-CM

## 2024-08-26 LAB — BASIC METABOLIC PANEL WITH GFR
Anion gap: 5 (ref 5–15)
Anion gap: 10 (ref 5–15)
BUN: 17 mg/dL (ref 6–20)
BUN: 19 mg/dL (ref 6–20)
CO2: 26 mmol/L (ref 22–32)
CO2: 22 mmol/L (ref 22–32)
Calcium: 7.3 mg/dL — ABNORMAL LOW (ref 8.9–10.3)
Calcium: 8.2 mg/dL — ABNORMAL LOW (ref 8.9–10.3)
Chloride: 104 mmol/L (ref 98–111)
Chloride: 102 mmol/L (ref 98–111)
Creatinine, Ser: 0.62 mg/dL (ref 0.61–1.24)
Creatinine, Ser: 0.69 mg/dL (ref 0.61–1.24)
GFR, Estimated: >60 mL/min (ref >60)
GFR, Estimated: >60 mL/min (ref >60)
Glucose, Bld: 125 mg/dL — ABNORMAL HIGH (ref 70–99)
Glucose, Bld: 166 mg/dL — ABNORMAL HIGH (ref 70–99)
Potassium: 3.7 mmol/L (ref 3.5–5.1)
Potassium: 4.5 mmol/L (ref 3.5–5.1)
Sodium: 135 mmol/L (ref 135–145)
Sodium: 134 mmol/L — ABNORMAL LOW (ref 135–145)

## 2024-08-26 LAB — GLUCOSE, CAPILLARY
Glucose-Capillary: 124 mg/dL — ABNORMAL HIGH (ref 70–99)
Glucose-Capillary: 135 mg/dL — ABNORMAL HIGH (ref 70–99)
Glucose-Capillary: 121 mg/dL — ABNORMAL HIGH (ref 70–99)
Glucose-Capillary: 150 mg/dL — ABNORMAL HIGH (ref 70–99)
Glucose-Capillary: 129 mg/dL — ABNORMAL HIGH (ref 70–99)
Glucose-Capillary: 138 mg/dL — ABNORMAL HIGH (ref 70–99)
Glucose-Capillary: 185 mg/dL — ABNORMAL HIGH (ref 70–99)
Glucose-Capillary: 174 mg/dL — ABNORMAL HIGH (ref 70–99)
Glucose-Capillary: 188 mg/dL — ABNORMAL HIGH (ref 70–99)

## 2024-08-26 LAB — CBC
HCT: 31.2 % — ABNORMAL LOW (ref 39.0–52.0)
HCT: 31.4 % — ABNORMAL LOW (ref 39.0–52.0)
Hemoglobin: 11.1 g/dL — ABNORMAL LOW (ref 13.0–17.0)
Hemoglobin: 11.2 g/dL — ABNORMAL LOW (ref 13.0–17.0)
MCH: 32 pg (ref 26.0–34.0)
MCH: 32.9 pg (ref 26.0–34.0)
MCHC: 35.6 g/dL (ref 30.0–36.0)
MCHC: 35.7 g/dL (ref 30.0–36.0)
MCV: 89.9 fL (ref 80.0–100.0)
MCV: 92.4 fL (ref 80.0–100.0)
Platelets: 233 K/uL (ref 150–400)
Platelets: 202 K/uL (ref 150–400)
RBC: 3.47 MIL/uL — ABNORMAL LOW (ref 4.22–5.81)
RBC: 3.4 MIL/uL — ABNORMAL LOW (ref 4.22–5.81)
RDW: 11.7 % (ref 11.5–15.5)
RDW: 11.9 % (ref 11.5–15.5)
WBC: 17.7 K/uL — ABNORMAL HIGH (ref 4.0–10.5)
WBC: 19.9 K/uL — ABNORMAL HIGH (ref 4.0–10.5)
nRBC: 0 % (ref 0.0–0.2)
nRBC: 0 % (ref 0.0–0.2)

## 2024-08-26 LAB — PROTIME-INR
INR: 1.3 — ABNORMAL HIGH (ref 0.8–1.2)
Prothrombin Time: 16.6 s — ABNORMAL HIGH (ref 11.4–15.2)

## 2024-08-26 LAB — ECHO INTRAOPERATIVE TEE
AR max vel: 0.91 cm2
AV Area VTI: 0.94 cm2
AV Area mean vel: 0.79 cm2
AV Mean grad: 56 mmHg
AV Peak grad: 83.5 mmHg
Ao pk vel: 4.57 m/s
Height: 73.5 in
Weight: 3520 [oz_av]

## 2024-08-26 LAB — MAGNESIUM
Magnesium: 2.2 mg/dL (ref 1.7–2.4)
Magnesium: 2.1 mg/dL (ref 1.7–2.4)
Magnesium: 2.1 mg/dL (ref 1.7–2.4)

## 2024-08-26 LAB — PHOSPHORUS: Phosphorus: 2.9 mg/dL (ref 2.5–4.6)

## 2024-08-26 MED ORDER — FUROSEMIDE 10 MG/ML IJ SOLN
40.0000 mg | Freq: Once | INTRAMUSCULAR | Status: AC
  Administered 2024-08-26: 17:00:00 40 mg via INTRAVENOUS
  Filled 2024-08-26: qty 4

## 2024-08-26 MED ORDER — LIDOCAINE 5 % EX PTCH
1.0000 | MEDICATED_PATCH | CUTANEOUS | Status: DC
  Administered 2024-08-26 – 2024-08-28 (×3): 1 via TRANSDERMAL
  Filled 2024-08-26 (×4): qty 1

## 2024-08-26 MED ORDER — AMIODARONE HCL IN DEXTROSE 360-4.14 MG/200ML-% IV SOLN
60.0000 mg/h | INTRAVENOUS | Status: AC
  Administered 2024-08-26 (×2): 60 mg/h via INTRAVENOUS
  Filled 2024-08-26 (×3): qty 200

## 2024-08-26 MED ORDER — METHOCARBAMOL 500 MG PO TABS
1000.0000 mg | ORAL_TABLET | Freq: Three times a day (TID) | ORAL | Status: AC
  Administered 2024-08-26 – 2024-08-29 (×9): 1000 mg via ORAL
  Filled 2024-08-26 (×9): qty 2

## 2024-08-26 MED ORDER — NICOTINE 14 MG/24HR TD PT24
14.0000 mg | MEDICATED_PATCH | Freq: Every day | TRANSDERMAL | Status: DC
  Administered 2024-08-26 – 2024-08-30 (×5): 14 mg via TRANSDERMAL
  Filled 2024-08-26 (×5): qty 1

## 2024-08-26 MED ORDER — AMIODARONE LOAD VIA INFUSION
150.0000 mg | Freq: Once | INTRAVENOUS | Status: AC
  Administered 2024-08-26: 14:00:00 150 mg via INTRAVENOUS
  Filled 2024-08-26: qty 83.34

## 2024-08-26 MED ORDER — WARFARIN SODIUM 2.5 MG PO TABS
2.5000 mg | ORAL_TABLET | Freq: Once | ORAL | Status: AC
  Administered 2024-08-26: 17:00:00 2.5 mg via ORAL
  Filled 2024-08-26: qty 1

## 2024-08-26 MED ORDER — WARFARIN - PHARMACIST DOSING INPATIENT: Freq: Every day | Status: DC

## 2024-08-26 MED ORDER — INSULIN ASPART 100 UNIT/ML IJ SOLN
0.0000 [IU] | INTRAMUSCULAR | Status: DC
  Administered 2024-08-26 (×2): 4 [IU] via SUBCUTANEOUS
  Administered 2024-08-26: 10:00:00 2 [IU] via SUBCUTANEOUS
  Administered 2024-08-26: 23:00:00 4 [IU] via SUBCUTANEOUS
  Administered 2024-08-27: 08:00:00 8 [IU] via SUBCUTANEOUS
  Administered 2024-08-27: 06:00:00 4 [IU] via SUBCUTANEOUS
  Filled 2024-08-26: qty 2
  Filled 2024-08-26: qty 8
  Filled 2024-08-26: qty 1
  Filled 2024-08-26 (×3): qty 4

## 2024-08-26 MED ORDER — INSULIN GLARGINE 100 UNIT/ML ~~LOC~~ SOLN
15.0000 [IU] | Freq: Every day | SUBCUTANEOUS | Status: DC
  Administered 2024-08-26: 10:00:00 15 [IU] via SUBCUTANEOUS
  Filled 2024-08-26 (×2): qty 0.15

## 2024-08-26 MED ORDER — OXYCODONE HCL 5 MG PO TABS
10.0000 mg | ORAL_TABLET | ORAL | Status: DC | PRN
  Administered 2024-08-26: 19:00:00 15 mg via ORAL
  Administered 2024-08-26: 23:00:00 10 mg via ORAL
  Administered 2024-08-27 (×2): 15 mg via ORAL
  Administered 2024-08-27: 03:00:00 10 mg via ORAL
  Administered 2024-08-27: 12:00:00 15 mg via ORAL
  Filled 2024-08-26 (×3): qty 3
  Filled 2024-08-26 (×2): qty 2
  Filled 2024-08-26: qty 3

## 2024-08-26 MED ORDER — POTASSIUM CHLORIDE 10 MEQ/50ML IV SOLN
10.0000 meq | INTRAVENOUS | Status: AC
  Administered 2024-08-26 (×3): 10 meq via INTRAVENOUS
  Filled 2024-08-26 (×3): qty 50

## 2024-08-26 NOTE — Progress Notes (Signed)
 Ordering echocardiogram to be completed in 6 weeks, post AVR. Results to Dr. Daniel and Dr. Barbaraann

## 2024-08-26 NOTE — Discharge Summary (Incomplete)
 [address] [place] [address]             [phone]        Physician Discharge Summary  Patient ID: Chris Skinner MRN: [id number] DOB/AGE: 1966/03/24 58 y.o.  Admit date: 08/25/2024 Discharge date: 08/26/2024  Admission Diagnoses:  Patient Active Problem List   Diagnosis Date Noted   S/P AVR (aortic valve replacement) 08/25/2024   Type 2 diabetes mellitus with hyperglycemia, without long-term current use of insulin  (HCC) 09/23/2023   History of COVID-19 05/09/2021   Smoker 04/26/2020   Erectile dysfunction 04/26/2020   Obesity (BMI 30.0-34.9) 04/26/2020   Family history of prostate cancer in father 04/26/2020   Family history of diabetes mellitus 04/26/2020   Moderate aortic stenosis 04/23/2019   Sleep apnea 04/23/2019   History of colonic polyps 01/05/2014   Dyslipidemia, goal LDL below 100 04/16/2012   Essential hypertension 04/16/2012     Discharge Diagnoses:  Patient Active Problem List   Diagnosis Date Noted   S/P AVR (aortic valve replacement) 08/25/2024   Type 2 diabetes mellitus with hyperglycemia, without long-term current use of insulin  (HCC) 09/23/2023   History of COVID-19 05/09/2021   Smoker 04/26/2020   Erectile dysfunction 04/26/2020   Obesity (BMI 30.0-34.9) 04/26/2020   Family history of prostate cancer in father 04/26/2020   Family history of diabetes mellitus 04/26/2020   Moderate aortic stenosis 04/23/2019   Sleep apnea 04/23/2019   History of colonic polyps 01/05/2014   Dyslipidemia, goal LDL below 100 04/16/2012   Essential hypertension 04/16/2012     Discharged Condition: {condition:18240}   Referring: [name], * Primary Care: [name], MD Primary Cardiologist:Kilauea ONEIDA Barbaraann, MD   History of Present Illness:    At time of CT surgical evaluation Chris Skinner is a 58 y.o. male who presents for surgical evaluation of severe aortic stenosis and coronary artery disease.  He  has a history of HL and active tobacco use (smokes 1 pack per week).  He is enrolled in a drug study for a cholesterol medication by [employer] and is unsure if he's on the experimental medication or placebo.    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  He denies dyspnea, chest pain, dizziness, syncope, leg swelling or palpitations but does have progressively worsening fatigue.  He works out, plays golf and walks 5-7 miles a day at work.  He lives in a house with his mother who has dementia and is not able bodied.  During his recovery, his sister will be staying with him.  He has been found to have what was felt to be a bicuspid aortic valve with mild AI and severe aortic stenosis.  He also was found to have complete occlusion of the RCA.  He has recently developed increasing fatigue.  Heart function by echocardiogram is found to be preserved.  Dr. Daniel  evaluated the patient and reviewed all relevant studies and it was her recommendation to proceed with surgery on 1215 for AVR/CABG x 1.  The patient was admitted electively for the procedure.  Hospital course:  Patient was admitted electively and on 08/25/2024 taken the operating room at which time he underwent aortic valve replacement with a Onyx mechanical #23 chemical prosthesis.  The RCA was evaluated and there was not felt to be a suitable region on the artery for bypass.  The patient tolerated the procedure well and  was taken to the surgical intensive care unit in stable condition.    Consults: pulmonary/intensive care  Significant Diagnostic Studies: {diagnostics:18242} ECHO INTRAOPERATIVE TEE Result Date: 08/26/2024  *INTRAOPERATIVE TRANSESOPHAGEAL REPORT *  Patient Name:   Chris Skinner Date of Exam: 08/25/2024 Medical Rec #:  [id number]      Height:       73.5 in Accession #:    [id number]     Weight:       220.0 lb Date of Birth:  11-15-1965      BSA:          2.25 m Patient Age:    58 years       BP:           144/95  mmHg Patient Gender: M              HR:           49 bpm. Exam Location:  Anesthesiology Transesophogeal exam was perform intraoperatively during surgical procedure. Patient was closely monitored under general anesthesia during the entirety of examination. Indications:     Q23.1 Bicuspid aortic valve. Aortic stenosis; Performing Phys: [id number] CON RAMAN SU Diagnosing Phys: Juliene Clinton MD Complications: No known complications during this procedure. POST-OP IMPRESSIONS _ Aortic Valve: A bileaflet mechanical valve was placed, leaflets are freely mobile. Manufactured by; On-X Size; 23mm. Normal washing jets for valve type. The gradient recorded across the prosthetic valve is within the expected range. No perivalvular leak noted. Mean PG 12 mmHg across prosthetic aortic valve. PRE-OP FINDINGS  Left Ventricle: The left ventricle has normal systolic function, with an ejection fraction of 60-65%. The cavity size was normal. There is severely increased left ventricular wall thickness. There is severe concentric left ventricular hypertrophy. Right Ventricle: The right ventricle has normal systolic function. The cavity was normal. There is no increase in right ventricular wall thickness. Left Atrium: Left atrial size was normal in size. No left atrial/left atrial appendage thrombus was detected. Right Atrium: Right atrial size was normal in size. Interatrial Septum: No atrial level shunt detected by color flow Doppler. Pericardium: There is no evidence of pericardial effusion. Mitral Valve: The mitral valve is normal in structure. Mitral valve regurgitation is trivial by color flow Doppler. There is No evidence of mitral stenosis. Tricuspid Valve: The tricuspid valve was normal in structure. Tricuspid valve regurgitation was not visualized by color flow Doppler. Aortic Valve: The aortic valve is bicuspid Aortic valve regurgitation is mild by color flow Doppler. There is severe stenosis of the aortic valve, with a calculated  valve area of 0.94 cm. There is severe thickening and moderate calcification present on the aortic valve right coronary, left coronary and non-coronary cusps with severely decreased mobility. Pulmonic Valve: The pulmonic valve was normal in structure. Pulmonic valve regurgitation is not visualized by color flow Doppler. Aorta: The aortic root, ascending aorta and aortic arch are normal in size and structure. +--------------+--------++ LEFT VENTRICLE         +--------------+--------++ PLAX 2D                +--------------+--------++ LVOT diam:    2.20 cm  +--------------+--------++ LVOT Area:    3.80 cm +--------------+--------++                        +--------------+--------++ +------------------+------------++ AORTIC VALVE                   +------------------+------------++ AV Area (Vmax):  0.91 cm     +------------------+------------++ AV Area (Vmean):  0.79 cm     +------------------+------------++ AV Area (VTI):    0.94 cm     +------------------+------------++ AV Vmax:          457.00 cm/s  +------------------+------------++ AV Vmean:         362.000 cm/s +------------------+------------++ AV VTI:           1.310 m      +------------------+------------++ AV Peak Grad:     83.5 mmHg    +------------------+------------++ AV Mean Grad:     56.0 mmHg    +------------------+------------++ LVOT Vmax:        109.00 cm/s  +------------------+------------++ LVOT Vmean:       74.800 cm/s  +------------------+------------++ LVOT VTI:         0.325 m      +------------------+------------++ LVOT/AV VTI ratio:0.25         +------------------+------------++  +--------------+-------+ SHUNTS                +--------------+-------+ Systemic VTI: 0.32 m  +--------------+-------+ Systemic Diam:2.20 cm +--------------+-------+  Juliene Clinton MD Electronically signed by Juliene Clinton MD Signature Date/Time: 08/26/2024/8:29:37 AM    Final     DG Chest Port 1 View Result Date: 08/26/2024 EXAM: 1 VIEW(S) XRAY OF THE CHEST 08/26/2024 06:10:00 AM COMPARISON: 08/25/2024 CLINICAL HISTORY: S/P AVR (aortic valve replacement) [id number]. FINDINGS: LINES, TUBES AND DEVICES: Endotracheal tube and enteric tube removed. Left chest tube, mediastinal drain, and right IJ central venous catheter stable in place. LUNGS AND PLEURA: Mild pulmonary edema. Unchanged left lung base opacity, likely atelectasis. Small left pleural effusion. No pneumothorax. HEART AND MEDIASTINUM: Stable heart size post sternotomy and AVR. Cardiomegaly. BONES AND SOFT TISSUES: Post sternotomy changes. IMPRESSION: 1. Mild pulmonary edema. 2. Small left pleural effusion. 3. Unchanged left lung base opacity, likely atelectasis. 4. Stable cardiomegaly status post sternotomy and aortic valve replacement. Electronically signed by: Waddell Calk MD 08/26/2024 07:53 AM EST RP Workstation: [id number]   DG Chest Port 1 View Result Date: 08/25/2024 CLINICAL DATA:  Status post aortic valve repair. EXAM: PORTABLE CHEST 1 VIEW COMPARISON:  Chest radiograph dated 08/21/2024. FINDINGS: Right IJ central venous line with tip over central SVC. Endotracheal tube with poorly visualized tip approximately 3.3 cm above the carina. Enteric tube extends below diaphragm with tip beyond the margin of the image. Left-sided chest tubes and inferiorly accessed mediastinal drain. Minimal left lung base atelectasis. No large pleural effusion or pneumothorax. Mild cardiomegaly. Median sternotomy wires. No acute osseous pathology. IMPRESSION: 1. Support devices as above. 2. Minimal left lung base atelectasis. No pneumothorax. Electronically Signed   By: Vanetta Chou M.D.   On: 08/25/2024 15:27   DG Chest 2 View Result Date: 08/25/2024 EXAM: 2 VIEW(S) XRAY OF THE CHEST 08/21/2024 10:46:25 AM COMPARISON: 07/25/24 CLINICAL HISTORY: Pre-op chest exam FINDINGS: LUNGS AND PLEURA: No focal pulmonary opacity. No pleural  effusion. No pneumothorax. HEART AND MEDIASTINUM: Atherosclerotic calcifications of the aortic arch. BONES AND SOFT TISSUES: Thoracic degenerative changes. No acute osseous abnormality. IMPRESSION: 1. No acute cardiopulmonary process. Electronically signed by: Waddell Calk MD 08/25/2024 06:07 AM EST RP Workstation: [id number]   CARDIAC CATHETERIZATION Result Date: 08/05/2024 1.  Known severe bicuspid aortic stenosis.  Heavily calcified aortic valve with restricted leaflet mobility on plain fluoroscopy 2.  Single-vessel coronary artery disease with total occlusion of the mid RCA, left-to-right collaterals supplied the PDA and PLA branches 3.  Mild diffuse plaquing of the left main, LAD, and  left circumflex with no hemodynamically significant stenoses   VAS US  CAROTID Result Date: 07/31/2024 Carotid Arterial Duplex Study Patient Name:  Chris Skinner  Date of Exam:   07/31/2024 Medical Rec #: [id number]       Accession #:    [id number] Date of Birth: 1965-12-22       Patient Gender: M Patient Age:   50 years Exam Location:  [address] Procedure:      VAS US  CAROTID Referring Phys: DARRYLE O'NEAL --------------------------------------------------------------------------------  Indications:  Preop exam; severe aortic stenosis. Risk Factors: Hypertension, hyperlipidemia, Diabetes, current smoker. Performing Technologist: Stoney Ross RVT  Examination Guidelines: A complete evaluation includes B-mode imaging, spectral Doppler, color Doppler, and power Doppler as needed of all accessible portions of each vessel. Bilateral testing is considered an integral part of a complete examination. Limited examinations for reoccurring indications may be performed as noted.  Right Carotid Findings: +----------+--------+--------+--------+----------------------+--------+           PSV cm/sEDV cm/sStenosisPlaque Description    Comments +----------+--------+--------+--------+----------------------+--------+ CCA Prox  108      29                                             +----------+--------+--------+--------+----------------------+--------+ CCA Mid   94      27                                             +----------+--------+--------+--------+----------------------+--------+ CCA Distal84      26                                             +----------+--------+--------+--------+----------------------+--------+ ICA Prox  129     30      1-39%   focal and heterogenous         +----------+--------+--------+--------+----------------------+--------+ ICA Mid   83      18                                             +----------+--------+--------+--------+----------------------+--------+ ICA Distal71      26                                             +----------+--------+--------+--------+----------------------+--------+ ECA       151     24                                             +----------+--------+--------+--------+----------------------+--------+ +----------+--------+-------+---------+-------------------+           PSV cm/sEDV cmsDescribe Arm Pressure (mmHG) +----------+--------+-------+---------+-------------------+ [id number]     16     [id number]                 +----------+--------+-------+---------+-------------------+ +---------+--------+--+--------+--+---------+ VertebralPSV cm/s38EDV cm/s11Antegrade +---------+--------+--+--------+--+---------+  Left Carotid Findings: +----------+--------+--------+--------+------------------+--------+  PSV cm/sEDV cm/sStenosisPlaque DescriptionComments +----------+--------+--------+--------+------------------+--------+ CCA Prox  102     23                                         +----------+--------+--------+--------+------------------+--------+ CCA Mid   99      27                                         +----------+--------+--------+--------+------------------+--------+ CCA Distal90       27                                         +----------+--------+--------+--------+------------------+--------+ ICA Prox  80      24      1-39%                              +----------+--------+--------+--------+------------------+--------+ ICA Mid   70      29                                         +----------+--------+--------+--------+------------------+--------+ ICA Distal67      28                                         +----------+--------+--------+--------+------------------+--------+ ECA       114     16                                         +----------+--------+--------+--------+------------------+--------+ +----------+--------+--------+----------------+-------------------+           PSV cm/sEDV cm/sDescribe        Arm Pressure (mmHG) +----------+--------+--------+----------------+-------------------+ [id number]             Multiphasic, [id number]                 +----------+--------+--------+----------------+-------------------+ +---------+--------+--+--------+--+---------+ VertebralPSV cm/s56EDV cm/s16Antegrade +---------+--------+--+--------+--+---------+   Summary: Right Carotid: Velocities in the right ICA are consistent with a 1-39% stenosis. Left Carotid: Velocities in the left ICA are consistent with a 1-39% stenosis. Vertebrals:  Bilateral vertebral arteries demonstrate antegrade flow. Subclavians: Right subclavian artery flow was disturbed. Normal flow              hemodynamics were seen in the left subclavian artery. *See table(s) above for measurements and observations.  Electronically signed by Evalene Lunger MD on 07/31/2024 at 9:33:49 PM.    Final      Treatments: {Tx:18249}  Surgeon:  Con Clunes, MD   First Assistant: Lemond Cera,  [facility]     Preoperative Diagnosis:  Severe aortic stenosis, single vessel coronary artery disease (RCA CTO)     Postoperative Diagnosis:  Same     Procedure:   Median Sternotomy Extracorporeal  circulation 3.   Aortic valve replacement using a 23 mm On-X valve. 4.   Right leg endoscopic vein harvest Adriana Cera, PA-C)   Anesthesia:  General Endotracheal  Discharge Exam:  Blood pressure 117/78, pulse 90, temperature 99.7 F (37.6 C), resp. rate 20, height 6' 1.5 (1.867 m), weight 106.8 kg, SpO2 93%. {physical [id number]   Discharge Medications:  The patient has been discharged on:   1.Beta Blocker:  Yes [   ]                              No   [   ]                              If No, reason:  2.Ace Inhibitor/ARB: Yes [   ]                                     No  [    ]                                     If No, reason:  3.Statin:   Yes [   ]                  No  [   ]                  If No, reason:  4.Ecasa:  Yes  [   ]                  No   [   ]                  If No, reason:  Patient had ACS upon admission:  Plavix/P2Y12 inhibitor: Yes [   ]                                      No  [   ]     Discharge Instructions     AMB Referral to Cardiac Rehabilitation - Phase II   Complete by: As directed    Diagnosis: Valve Replacement   Valve: Aortic   After initial evaluation and assessments completed: Virtual Based Care may be provided alone or in conjunction with Phase 2 Cardiac Rehab based on patient barriers.: Yes   Intensive Cardiac Rehabilitation (ICR) [facility] location only OR Traditional Cardiac Rehabilitation (TCR) *If criteria for ICR are not met will enroll in TCR ([facility] only): Yes      Allergies as of 08/26/2024   No Known Allergies   Med Rec must be completed prior to using this [facility]***       Follow-up Information     Su, Con RAMAN, MD Follow up.   Specialty: Cardiothoracic Surgery Why: Please see discharge paperwork for details of follow-up appointment with surgeons office.  On the date you see them you will obtain a chest x-ray 1/2-hour prior to the appointment.  This will be done on the second floor of the same office  complex. Contact information: [address] [place] [address] [phone]         [name], MD Follow up.   Specialty: Family Medicine Why: Please schedule an appointment with your primary care provider in the next 1 to 2 weeks. Contact information:  [address] [place] [address] [phone]                 Signed:  Cirilo Canner E Mohd. Derflinger, PA-C  08/26/2024, 9:34 AM

## 2024-08-26 NOTE — Progress Notes (Signed)
 1 Day Post-Op Procedures (LRB): REPLACEMENT, AORTIC VALVE, OPEN USING On-X PROSTHETIC HEART VALVE (N/A) ENDOSCOPICALLY HARVESTED RIGHT GREATER SAPHENOUS VEIN (N/A) ECHOCARDIOGRAM, TRANSESOPHAGEAL, INTRAOPERATIVE (N/A) Subjective: No events overnight On levo 4 this morning Stood this AM without issues NSR 70s, A-pacing for pressure  Objective: Vital signs in last 24 hours: BP 118/74   Pulse 90   Temp 99.7 F (37.6 C)   Resp 15   Ht 6' 1.5 (1.867 m)   Wt 106.8 kg   SpO2 93%   BMI 30.64 kg/m  Filed Weights   08/25/24 0600 08/26/24 0500  Weight: 99.8 kg 106.8 kg    Hemodynamic parameters for last 24 hours: CVP:  [3 mmHg-82 mmHg] 8 mmHg CO:  [4.1 L/min-9.1 L/min] 6.9 L/min CI:  [1.9 L/min/m2-4.1 L/min/m2] 3.2 L/min/m2  Intake/Output from previous day: 12/15 0701 - 12/16 0700 In: 6400.2 [P.O.:400; I.V.:2959.6; Blood:451; IV Piggyback:2589.6] Out: 3862 [Urine:2245; Blood:677; Chest Tube:940] Intake/Output this shift: No intake/output data recorded.  Physical Exam: General - Resting comfortably in bed CV - A-paced, NSR 70s underlying Resp - Unlabored on [place]; chest tube output serosanguinous Abd - Soft, ND/NT Ext - Mild edema  Lab Results:    Latest Ref Rng & Units 08/26/2024    5:06 AM 08/25/2024    8:59 PM 08/25/2024    4:50 PM  CBC  WBC 4.0 - 10.5 K/uL 17.7  18.0    Hemoglobin 13.0 - 17.0 g/dL 88.8  88.8  89.7   Hematocrit 39.0 - 52.0 % 31.2  31.3  30.0   Platelets 150 - 400 K/uL 233  233        Latest Ref Rng & Units 08/26/2024    5:06 AM 08/25/2024    8:59 PM 08/25/2024    4:50 PM  CMP  Glucose 70 - 99 mg/dL 874  841    BUN 6 - 20 mg/dL 17  17    Creatinine 9.38 - 1.24 mg/dL 9.37  9.26    Sodium 864 - 145 mmol/L 135  141  143   Potassium 3.5 - 5.1 mmol/L 3.7  3.9  3.9   Chloride 98 - 111 mmol/L 104  105    CO2 22 - 32 mmol/L 26  26    Calcium  8.9 - 10.3 mg/dL 7.3  7.6      CXR: No pneumothorax or effusion  Assessment/Plan: S/P Procedures  (LRB): REPLACEMENT, AORTIC VALVE, OPEN USING On-X PROSTHETIC HEART VALVE (N/A) ENDOSCOPICALLY HARVESTED RIGHT GREATER SAPHENOUS VEIN (N/A) ECHOCARDIOGRAM, TRANSESOPHAGEAL, INTRAOPERATIVE (N/A) POD1 s/p mechanical AVR NEURO- intact  Pain control PRN Deconditioning- PT/OT CV- in SR around 70 bpm, A-pacing for pressure             Keep pacing wires  Wean levo as tolerated RESP- Improved lung aeration             Continue IS, pulm hygiene, ambulation  Continue Chest tubes RENAL- creatinine and lytes Ok  Weight up 15 lbs compared to pre-op             Potentially start Lasix  this PM  Foley will stay for today GI- tolerating clears             Advance diet  BM: None since surgery Endo- BG well controlled Transition to ISS ID- PPX DVT ppx - SCD + coumadin  tonight pending INR  Dispo: ICU   LOS: 1 day    Con RAMAN Makaylee Spielberg 08/26/2024

## 2024-08-26 NOTE — Anesthesia Postprocedure Evaluation (Signed)
 Anesthesia Post Note  Patient: Chris Skinner  Procedure(s) Performed: REPLACEMENT, AORTIC VALVE, OPEN USING On-X PROSTHETIC HEART VALVE (Chest) ENDOSCOPICALLY HARVESTED RIGHT GREATER SAPHENOUS VEIN (Chest) ECHOCARDIOGRAM, TRANSESOPHAGEAL, INTRAOPERATIVE     Patient location during evaluation: SICU Anesthesia Type: General Level of consciousness: sedated Pain management: pain level controlled Vital Signs Assessment: post-procedure vital signs reviewed and stable Respiratory status: patient remains intubated per anesthesia plan Cardiovascular status: stable Postop Assessment: no apparent nausea or vomiting Anesthetic complications: no   No notable events documented.  Last Vitals:  Vitals:   08/26/24 0745 08/26/24 0800  BP:  132/88  Pulse: 91 93  Resp: 18 (!) 28  Temp:    SpO2: 94% 93%    Last Pain:  Vitals:   08/26/24 0756  TempSrc:   PainSc: 2                  Trinika Cortese S

## 2024-08-26 NOTE — TOC Initial Note (Signed)
 Transition of Care [facility]) - Initial/Assessment Note    Patient Details  Name: Chris Skinner MRN: [id number] Date of Birth: 1966/08/27  Transition of Care [facility]) CM/SW Contact:    Justina Delcia Czar, RN Phone Number: [phone] 08/26/2024, 6:00 PM  Clinical Narrative:                 Spoke to pt at bedside, gave permission to speak to family. Spoke to pt's friend. States pt was independent pta. Lives at home with mother. 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 His sister is currently assisting and they have an aide.   Will need PT/OT evaluation and recommendations.   Notified by [facility] liaison -following patient with TCTS office protocol referral prearranged for [facility] needs if needed.  Will continue to follow for dc needs.   Expected Discharge Plan: Home w Home Health Services Barriers to Discharge: Continued Medical Work up   Patient Goals and CMS Choice            Expected Discharge Plan and Services   Discharge Planning Services: CM Consult   Living arrangements for the past 2 months: Single Family Home                           HH Arranged: RN HH Agency: [facility] ([facility])        Prior Living Arrangements/Services Living arrangements for the past 2 months: Single Family Home Lives with:: Parents Patient language and need for interpreter reviewed:: Yes        Need for Family Participation in Patient Care: No (Comment) Care giver support system in place?: Yes (comment)   Criminal Activity/Legal Involvement Pertinent to Current Situation/Hospitalization: No - Comment as needed  Activities of Daily Living   ADL Screening (condition at time of admission) Independently performs ADLs?: Yes (appropriate for developmental age) Is the patient deaf or have difficulty hearing?: No Does the patient have difficulty seeing, even when wearing glasses/contacts?: No Does the patient have difficulty concentrating, remembering, or making decisions?:  No  Permission Sought/Granted Permission sought to share information with : Case Manager, Family Supports, PCP Permission granted to share information with : Yes, Verbal Permission Granted  Share Information with NAME: Andree Novak  Permission granted to share info w AGENCY: Home health, DME, PCP  Permission granted to share info w Relationship: sister  Permission granted to share info w Contact Information: [phone]  Emotional Assessment Appearance:: Appears stated age Attitude/Demeanor/Rapport: Lethargic   Orientation: : Oriented to Self, Oriented to Place, Oriented to  Time, Oriented to Situation      Admission diagnosis:  Severe aortic stenosis [I35.0] Coronary artery disease involving native coronary artery of native heart without angina pectoris [I25.10] S/P AVR (aortic valve replacement) [Z95.2] Patient Active Problem List   Diagnosis Date Noted   S/P AVR (aortic valve replacement) 08/25/2024   Type 2 diabetes mellitus with hyperglycemia, without long-term current use of insulin  (HCC) 09/23/2023   History of COVID-19 05/09/2021   Smoker 04/26/2020   Erectile dysfunction 04/26/2020   Obesity (BMI 30.0-34.9) 04/26/2020   Family history of prostate cancer in father 04/26/2020   Family history of diabetes mellitus 04/26/2020   Moderate aortic stenosis 04/23/2019   Sleep apnea 04/23/2019   History of colonic polyps 01/05/2014   Dyslipidemia, goal LDL below 100 04/16/2012   Essential hypertension 04/16/2012   PCP:  [name], MD Pharmacy:   [facility] [phone] [address], [place] -  [address] [place] [address] Phone: [phone] Fax: [phone]     Social Drivers of Health (SDOH) Social History: SDOH Screenings   Food Insecurity: No Food Insecurity (08/26/2024)  Housing: Low Risk (08/26/2024)  Transportation Needs: No Transportation Needs (08/26/2024)  Utilities: Not At Risk (08/26/2024)  Depression (PHQ2-9): Low Risk  (05/23/2023)  Financial Resource Strain: Low Risk (05/23/2023)  Physical Activity: Sufficiently Active (05/23/2023)  Social Connections: Moderately Isolated (05/23/2023)  Stress: No Stress Concern Present (05/23/2023)  Tobacco Use: Medium Risk (08/25/2024)   SDOH Interventions:     Readmission Risk Interventions     No data to display

## 2024-08-26 NOTE — Progress Notes (Addendum)
 PM rounds  D/w TCTS Lasix  x 1 Amio for ectopy Walked in halls Keep A-line for now Start warfarin  Dan Daion Ginsberg MD PCCM

## 2024-08-26 NOTE — Progress Notes (Signed)
 PHARMACY - ANTICOAGULATION CONSULT NOTE  Pharmacy Consult for Warfarin Indication: on-X valve  Allergies[1]  Patient Measurements: Height: 6' 1.5 (186.7 cm) Weight: 106.8 kg (235 lb 7.2 oz) IBW/kg (Calculated) : 81.05 HEPARIN  DW (KG): 99.8  Vital Signs: Temp: 99.7 F (37.6 C) (12/16 0515) BP: 112/78 (12/16 1400) Pulse Rate: 82 (12/16 1430)  Labs: Recent Labs    08/25/24 1506 08/25/24 1507 08/25/24 1650 08/25/24 2059 08/26/24 0506 08/26/24 0731 08/26/24 1355  HGB 11.9*   < > 10.2* 11.1* 11.1*  --   --   HCT 33.5*   < > 30.0* 31.3* 31.2*  --   --   PLT 179  --   --  233 233  --   --   APTT 32  --   --   --   --   --   --   LABPROT 18.1*  --   --   --   --  16.6*  --   INR 1.4*  --   --   --   --  1.3*  --   CREATININE  --   --   --  0.73 0.62  --  0.69   < > = values in this interval not displayed.    Estimated Creatinine Clearance: 126.9 mL/min (by C-G formula based on SCr of 0.69 mg/dL).   Medical History: Past Medical History:  Diagnosis Date   Chest pain 2001   [place], normal cardiac workup   Coronary artery disease    Diabetes mellitus without complication (HCC)    Dyslipidemia    H/O echocardiogram 09/2013   PFO found incidentally.  Echo done due to murmur   Heart murmur    mild aortic stenosis   Hyperlipidemia    Hypertension    Personal history of colonic polyp - adenoma 01/05/2014   Sleep apnea    cpap   Tobacco use disorder    Wears glasses     Medications:  Scheduled:   acetaminophen   1,000 mg Oral Q6H   Or   acetaminophen  (TYLENOL ) oral liquid 160 mg/5 mL  1,000 mg Per Tube Q6H   aspirin  EC  325 mg Oral Daily   Or   aspirin   324 mg Per Tube Daily   bisacodyl   10 mg Oral Daily   Or   bisacodyl   10 mg Rectal Daily   Chlorhexidine  Gluconate Cloth  6 each Topical Daily   docusate sodium   200 mg Oral Daily   ezetimibe   10 mg Oral Daily   insulin  aspart  0-24 Units Subcutaneous Q4H   insulin  glargine  15 Units Subcutaneous Daily    methocarbamol   1,000 mg Oral Q8H   metoCLOPramide  (REGLAN ) injection  10 mg Intravenous Q6H   metoprolol  tartrate  12.5 mg Oral BID   Or   metoprolol  tartrate  12.5 mg Per Tube BID   nicotine   14 mg Transdermal Daily   [START ON 08/27/2024] pantoprazole   40 mg Oral Daily   pantoprazole  (PROTONIX ) IV  40 mg Intravenous QHS   rosuvastatin   40 mg Oral Daily   sodium chloride  flush  3 mL Intravenous Q12H   warfarin  2.5 mg Oral Once   [START ON 08/27/2024] Warfarin - Pharmacist Dosing Inpatient   Does not apply q1600   Infusions:   sodium chloride  Stopped (08/26/24 0552)   albumin  human Stopped (08/25/24 2230)   amiodarone  60 mg/hr (08/26/24 1419)    ceFAZolin  (ANCEF ) IV 200 mL/hr at 08/26/24 1400   norepinephrine  (  LEVOPHED ) Adult infusion 2 mcg/min (08/26/24 1400)   PRN: albumin  human, dextrose , metoprolol  tartrate, morphine  injection, ondansetron  (ZOFRAN ) IV, oxyCODONE , sodium chloride  flush, traMADol   Assessment: 58 yo male s/p elective CABG with aortic valve replacement with On-X mechanical. Now to begin warfarin POD#1  Goal of Therapy:  INR 1.5-2.0   Plan:  Warfarin 2.5mg  PO x 1 Daily PT/INR  Rocky Slade, PharmD, BCPS 08/26/2024,3:36 PM      [1] No Known Allergies

## 2024-08-26 NOTE — Progress Notes (Incomplete Revision)
 PM rounds  D/w TCTS Lasix  x 1 Amio for ectopy Walked in halls Keep A-line for now Start warfarin  Dan Daion Ginsberg MD PCCM

## 2024-08-26 NOTE — Progress Notes (Addendum)
 PT Cancellation Note  Patient Details Name: Chris Skinner MRN: [id number] DOB: 1966-07-10   Cancelled Treatment:    Reason Eval/Treat Not Completed: Medical issues which prohibited therapy; per RN wait to see in pm. Will follow up later today.   Montie Portal 08/26/2024, 9:42 AM Micheline Portal, PT Acute Rehabilitation Services Office:[phone] 08/26/2024

## 2024-08-26 NOTE — Plan of Care (Signed)

## 2024-08-26 NOTE — Progress Notes (Signed)
 NAME:  Chris Skinner, MRN:  [id number], DOB:  04-28-1966, LOS: 1 ADMISSION DATE:  08/25/2024 CONSULTATION DATE:  08/25/2024 REFERRING MD:  Dr. Daniel CHIEF COMPLAINT:  CAD   History of Present Illness:  Seen in consultation at the request of Dr. Daniel (TCTS) for management of mechanical ventilation and hemodynamics post-CABG.  58 year old male who presented to [facility] 08/25/2024 for scheduled CABG. PMHx significant for severe aortic stenosis and coronary artery disease, OSA on home CPAP, with active tobacco use, Hyperlipidemia, Type 2 DM. Per CTS documentation He has a bicuspid aortic valve with mild AI and severe AS. 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 His main symptom is fatigue. His heart function is preserved. Planned CABG, no good targets to complete, underwent aortic valve replacement with OnX mechanical. Intra-op given DDAVP  and TXA for oozing. EBI 667.   On arrival to ICU patient on 2 mcg/min levophed . Has received albumin  x 2. CO 5.5, CI 2.5. Precedex  on 0.7. Given 2 mg versed . Pacerwires in place. On DDD at 15. Underlying sinus bradycardia.     Pertinent Medical History:  Severe aortic stenosis and coronary artery disease, OSA on home CPAP, with active tobacco use, Hyperlipidemia, Type 2 DM.  Significant Hospital Events: Including procedures, antibiotic start and stop dates in addition to other pertinent events   12/15 AVR 12/16 extubated, on norepi  Interim History / Subjective:  Extubated successfully  Remains on 1 mcg norepi  CVP 10-14 2.2L UOP yesterday CI 2.7, CO 5.8, SVR 993    Objective:  Blood pressure 132/88, pulse 93, temperature 99.7 F (37.6 C), resp. rate (!) 28, height 6' 1.5 (1.867 m), weight 106.8 kg, SpO2 93%. CVP:  [3 mmHg-82 mmHg] 13 mmHg CO:  [4.1 L/min-9.1 L/min] 8 L/min CI:  [1.9 L/min/m2-4.1 L/min/m2] 3.6 L/min/m2  Vent Mode: SIMV;PSV;PRVC FiO2 (%):  [50 %] 50 % Set Rate:  [16 bmp] 16 bmp Vt Set:  [640 mL] 640 mL PEEP:  [5 cmH20] 5  cmH20 Pressure Support:  [10 cmH20] 10 cmH20   Intake/Output Summary (Last 24 hours) at 08/26/2024 9178 Last data filed at 08/26/2024 0800 Gross per 24 hour  Intake 5873.95 ml  Output 3967 ml  Net 1906.95 ml   Filed Weights   08/25/24 0600 08/26/24 0500  Weight: 99.8 kg 106.8 kg    General:  well nourished M sitting up in bed in NAD  HEENT: MM pink/moist Neuro: sleeping, but easily awakened, oriented and moving all extremities  CV: s1s2 rrr, no m/r/g PULM:  clear bilaterally on [place] GI: soft, non-tender to palpation  Extremities: warm/dry, trace peripheral edema     Resolved Hospital Problem List:   Postoperative vent management  Assessment & Plan:    Hx OSA with CPAP at HS Active Tobacco Use  -encourage mobilization and IS -CPAP at bedtime  -nicotine  patch  -advance diet   Severe Aortic Stenosis s/p AVR CAD Hyperlipidemia  - Monitor CI, CO - Maintain MAP >65 (currently on 1 mcg levophed ). - Continue ASA -keep pacing wires today -monitor UOP today and consider diuresis this evening  - Monitor Chest tube output  - Continue Zetia   - Postoperative care per TCTS - CT management per protocol  Type 2 DM - Trend glucose - transition off insulin  gtt to SSI today    Labs:  CBC: Recent Labs  Lab 08/21/24 1057 08/25/24 0758 08/25/24 1204 08/25/24 1206 08/25/24 1506 08/25/24 1507 08/25/24 1650 08/25/24 2059 08/26/24 0506  WBC 10.6*  --   --   --  22.1*  --   --  18.0* 17.7*  HGB 17.1*   < > 12.2*   < > 11.9* 11.2* 10.2* 11.1* 11.1*  HCT 48.4   < > 33.5*   < > 33.5* 33.0* 30.0* 31.3* 31.2*  MCV 91.0  --   --   --  90.1  --   --  90.5 89.9  PLT 300  --  275  --  179  --   --  233 233   < > = values in this interval not displayed.    Basic Metabolic Panel: Recent Labs  Lab 08/21/24 1057 08/25/24 0758 08/25/24 1107 08/25/24 1138 08/25/24 1206 08/25/24 1302 08/25/24 1305 08/25/24 1507 08/25/24 1650 08/25/24 2059 08/26/24 0506  NA 138   < > 139    < > 140 143 140 141 143 141 135  K 4.1   < > 4.3   < > 4.2 3.7 3.6 4.4 3.9 3.9 3.7  CL 100   < > 103  --  103 105  --   --   --  105 104  CO2 30  --   --   --   --   --   --   --   --  26 26  GLUCOSE 124*   < > 144*  --  145* 119*  --   --   --  158* 125*  BUN 18   < > 17  --  17 17  --   --   --  17 17  CREATININE 0.84   < > 0.60*  --  0.60* 0.60*  --   --   --  0.73 0.62  CALCIUM  9.5  --   --   --   --   --   --   --   --  7.6* 7.3*  MG  --   --   --   --   --   --   --   --   --  2.2 2.2  PHOS  --   --   --   --   --   --   --   --   --   --  2.9   < > = values in this interval not displayed.   GFR: Estimated Creatinine Clearance: 126.9 mL/min (by C-G formula based on SCr of 0.62 mg/dL). Recent Labs  Lab 08/21/24 1057 08/25/24 1506 08/25/24 2059 08/26/24 0506  WBC 10.6* 22.1* 18.0* 17.7*    Liver Function Tests: Recent Labs  Lab 08/21/24 1057  AST 30  ALT 27  ALKPHOS 46  BILITOT 1.1  PROT 7.3  ALBUMIN  4.4   No results for input(s): LIPASE, AMYLASE in the last 168 hours. No results for input(s): AMMONIA in the last 168 hours.  ABG    Component Value Date/Time   PHART 7.351 08/25/2024 1650   PCO2ART 36.1 08/25/2024 1650   PO2ART 71 (L) 08/25/2024 1650   HCO3 20.0 08/25/2024 1650   TCO2 21 (L) 08/25/2024 1650   ACIDBASEDEF 5.0 (H) 08/25/2024 1650   O2SAT 93 08/25/2024 1650     Coagulation Profile: Recent Labs  Lab 08/21/24 1057 08/25/24 1506 08/26/24 0731  INR 1.0 1.4* 1.3*    Cardiac Enzymes: No results for input(s): CKTOTAL, CKMB, CKMBINDEX, TROPONINI in the last 168 hours.  HbA1C: Hemoglobin A1C  Date/Time Value Ref Range Status  03/25/2024 09:12 AM 6.6 (A) 4.0 - 5.6 % Final  09/25/2023 03:36 PM 6.1 (  A) 4.0 - 5.6 % Final   Hgb A1c MFr Bld  Date/Time Value Ref Range Status  08/21/2024 10:58 AM 6.0 (H) 4.8 - 5.6 % Final    Comment:    (NOTE) Diagnosis of Diabetes The following HbA1c ranges recommended by the American Diabetes  Association (ADA) may be used as an aid in the diagnosis of diabetes mellitus.  Hemoglobin             Suggested A1C NGSP%              Diagnosis  <5.7                   Non Diabetic  5.7-6.4                Pre-Diabetic  >6.4                   Diabetic  <7.0                   Glycemic control for                       adults with diabetes.    06/18/2017 01:12 PM 5.3 <5.7 % of total Hgb Final    Comment:    For the purpose of screening for the presence of diabetes: . <5.7%       Consistent with the absence of diabetes 5.7-6.4%    Consistent with increased risk for diabetes             (prediabetes) > or =6.5%  Consistent with diabetes . This assay result is consistent with a decreased risk of diabetes. . Currently, no consensus exists regarding use of hemoglobin A1c for diagnosis of diabetes in children. . According to American Diabetes Association (ADA) guidelines, hemoglobin A1c <7.0% represents optimal control in non-pregnant diabetic patients. Different metrics may apply to specific patient populations.  Standards of Medical Care in Diabetes(ADA). .     CBG: Recent Labs  Lab 08/25/24 2301 08/26/24 0101 08/26/24 0257 08/26/24 0502 08/26/24 0658  GLUCAP 141* 124* 135* 121* 150*    Review of Systems:   Unable to review, intubated/sedated  Past Medical History:  He,  has a past medical history of Chest pain (2001), Coronary artery disease, Diabetes mellitus without complication (HCC), Dyslipidemia, H/O echocardiogram (09/2013), Heart murmur, Hyperlipidemia, Hypertension, Personal history of colonic polyp - adenoma (01/05/2014), Sleep apnea, Tobacco use disorder, and Wears glasses.   Surgical History:   Past Surgical History:  Procedure Laterality Date   AORTIC VALVE REPLACEMENT N/A 08/25/2024   Procedure: REPLACEMENT, AORTIC VALVE, OPEN USING On-X PROSTHETIC HEART VALVE;  Surgeon: Daniel Con RAMAN, MD;  Location: [facility] OR;  Service: Open Heart Surgery;   Laterality: N/A;   BIOPSY  01/17/2021   Procedure: BIOPSY;  Surgeon: Aneita Gwendlyn DASEN, MD;  Location: [facility] ENDOSCOPY;  Service: Endoscopy;;   COLONOSCOPY  2015   CORONARY ANGIOGRAPHY N/A 08/05/2024   Procedure: CORONARY ANGIOGRAPHY;  Surgeon: Wonda Sharper, MD;  Location: [facility] INVASIVE CV LAB;  Service: Cardiovascular;  Laterality: N/A;   CORONARY ARTERY BYPASS GRAFT N/A 08/25/2024   Procedure: ENDOSCOPICALLY HARVESTED RIGHT GREATER SAPHENOUS VEIN;  Surgeon: Daniel Con RAMAN, MD;  Location: [facility] OR;  Service: Open Heart Surgery;  Laterality: N/A;   ESOPHAGOGASTRODUODENOSCOPY (EGD) WITH PROPOFOL  N/A 01/17/2021   Procedure: ESOPHAGOGASTRODUODENOSCOPY (EGD) WITH PROPOFOL ;  Surgeon: Aneita Gwendlyn DASEN, MD;  Location: [facility] ENDOSCOPY;  Service: Endoscopy;  Laterality: N/A;  FOREIGN BODY REMOVAL  01/17/2021   Procedure: FOREIGN BODY REMOVAL;  Surgeon: Aneita Gwendlyn DASEN, MD;  Location: [facility] ENDOSCOPY;  Service: Endoscopy;;   INTRAOPERATIVE TRANSESOPHAGEAL ECHOCARDIOGRAM N/A 08/25/2024   Procedure: ECHOCARDIOGRAM, TRANSESOPHAGEAL, INTRAOPERATIVE;  Surgeon: Daniel Con RAMAN, MD;  Location: [facility] OR;  Service: Open Heart Surgery;  Laterality: N/A;   KNEE ARTHROSCOPY Left 1985   TONSILLECTOMY AND ADENOIDECTOMY  1974     Social History:   reports that he has quit smoking. His smoking use included cigarettes. He quit smokeless tobacco use about 16 years ago.  His smokeless tobacco use included chew. He reports current alcohol use of about 10.0 standard drinks of alcohol per week. He reports that he does not use drugs.   Family History:  His family history includes Arthritis in his mother; Crohn's disease in his sister; Diabetes in his father and paternal grandfather; Heart disease in his father and mother; Hyperlipidemia in his mother; Stroke in his mother. There is no history of Cancer, Colon cancer, Colon polyps, Esophageal cancer, Stomach cancer, or Rectal cancer.   Allergies: Allergies[1]   Home Medications: Prior to  Admission medications  Medication Sig Start Date End Date Taking? Authorizing Provider  amLODipine  (NORVASC ) 5 MG tablet TAKE 1 TABLET (5 MG TOTAL) BY MOUTH DAILY. 06/25/24  Yes [name], MD  aspirin  EC 81 MG tablet Take 1 tablet (81 mg total) by mouth daily. Swallow whole. 07/29/24  Yes O'Neal, Darryle Ned, MD  cetirizine (ZYRTEC) 10 MG chewable tablet Chew 10 mg by mouth in the morning.   Yes [provider]  dapagliflozin  propanediol (FARXIGA ) 5 MG TABS tablet Take 1 tablet (5 mg total) by mouth daily before breakfast. 05/30/24  Yes [name], MD  ezetimibe  (ZETIA ) 10 MG tablet TAKE 1 TABLET BY MOUTH EVERY DAY 06/25/24  Yes [name], MD  Investigational - Study Medication Take 1 tablet by mouth daily. Study name:  PURSUIT Phase IIb trial for [employer]'s [id number] Additional study details: [id number] 30 mg or placebo   Yes [provider]  lisinopril -hydrochlorothiazide  (ZESTORETIC ) 20-12.5 MG tablet TAKE 1 TABLET BY MOUTH EVERY DAY 06/25/24  Yes Lalonde, John C, MD  Multiple Vitamins-Minerals (MULTIVITAMIN WITH MINERALS) tablet Take 2 tablets by mouth in the morning.   Yes [provider]  Naproxen Sodium 220 MG CAPS Take 440 mg by mouth in the morning.   Yes [provider]  rosuvastatin  (CRESTOR ) 40 MG tablet TAKE 1 TABLET BY MOUTH EVERY DAY 06/25/24  Yes [name], MD     Critical care time: 35 minutes   CRITICAL CARE Performed by: Leita SAUNDERS Tresa Jolley   Total critical care time: 35 minutes  Critical care time was exclusive of separately billable procedures and treating other patients.  Critical care was necessary to treat or prevent imminent or life-threatening deterioration.  Critical care was time spent personally by me on the following activities: development of treatment plan with patient and/or surrogate as well as nursing, discussions with consultants, evaluation of patient's response to treatment, examination of patient,  obtaining history from patient or surrogate, ordering and performing treatments and interventions, ordering and review of laboratory studies, ordering and review of radiographic studies, pulse oximetry and re-evaluation of patient's condition.   Leita SAUNDERS Shayleigh Bouldin, PA-C [facility] See Amion for pager If no response to pager , please call [phone] until 7pm After 7:00 pm call Elink  [phone]       [1] No Known Allergies

## 2024-08-26 NOTE — Progress Notes (Signed)
° °  [address] [place] [address]             [phone]   POD # 1 AVR  Resting comfortably  BP 124/75   Pulse 86   Temp 99.7 F (37.6 C)   Resp (!) 28   Ht 6' 1.5 (1.867 m)   Wt 106.8 kg   SpO2 90%   BMI 30.64 kg/m  5L Devol 90% sat   Intake/Output Summary (Last 24 hours) at 08/26/2024 1733 Last data filed at 08/26/2024 1700 Gross per 24 hour  Intake 2100.82 ml  Output 2115 ml  Net -14.18 ml   Creatinine 0.69, K 2.5 Hgb 11.2 PLT 202  Doing well POD # 1  Torri Langston C. Kerrin, MD [facility] [phone]

## 2024-08-27 ENCOUNTER — Inpatient Hospital Stay (HOSPITAL_COMMUNITY)

## 2024-08-27 DIAGNOSIS — F17200 Nicotine dependence, unspecified, uncomplicated: Secondary | ICD-10-CM

## 2024-08-27 DIAGNOSIS — I4891 Unspecified atrial fibrillation: Secondary | ICD-10-CM

## 2024-08-27 DIAGNOSIS — G4733 Obstructive sleep apnea (adult) (pediatric): Secondary | ICD-10-CM

## 2024-08-27 LAB — GLUCOSE, CAPILLARY
Glucose-Capillary: 166 mg/dL — ABNORMAL HIGH (ref 70–99)
Glucose-Capillary: 211 mg/dL — ABNORMAL HIGH (ref 70–99)
Glucose-Capillary: 172 mg/dL — ABNORMAL HIGH (ref 70–99)
Glucose-Capillary: 174 mg/dL — ABNORMAL HIGH (ref 70–99)
Glucose-Capillary: 141 mg/dL — ABNORMAL HIGH (ref 70–99)

## 2024-08-27 LAB — CBC
HCT: 30.3 % — ABNORMAL LOW (ref 39.0–52.0)
Hemoglobin: 10.7 g/dL — ABNORMAL LOW (ref 13.0–17.0)
MCH: 31.9 pg (ref 26.0–34.0)
MCHC: 35.3 g/dL (ref 30.0–36.0)
MCV: 90.4 fL (ref 80.0–100.0)
Platelets: 203 K/uL (ref 150–400)
RBC: 3.35 MIL/uL — ABNORMAL LOW (ref 4.22–5.81)
RDW: 11.7 % (ref 11.5–15.5)
WBC: 16.6 K/uL — ABNORMAL HIGH (ref 4.0–10.5)
nRBC: 0 % (ref 0.0–0.2)

## 2024-08-27 LAB — BASIC METABOLIC PANEL WITH GFR
Anion gap: 9 (ref 5–15)
BUN: 22 mg/dL — ABNORMAL HIGH (ref 6–20)
CO2: 24 mmol/L (ref 22–32)
Calcium: 8.4 mg/dL — ABNORMAL LOW (ref 8.9–10.3)
Chloride: 100 mmol/L (ref 98–111)
Creatinine, Ser: 0.65 mg/dL (ref 0.61–1.24)
GFR, Estimated: >60 mL/min (ref >60)
Glucose, Bld: 159 mg/dL — ABNORMAL HIGH (ref 70–99)
Potassium: 4.2 mmol/L (ref 3.5–5.1)
Sodium: 132 mmol/L — ABNORMAL LOW (ref 135–145)

## 2024-08-27 LAB — PROTIME-INR
INR: 1.3 — ABNORMAL HIGH (ref 0.8–1.2)
Prothrombin Time: 17.3 s — ABNORMAL HIGH (ref 11.4–15.2)

## 2024-08-27 LAB — PHOSPHORUS: Phosphorus: 1.5 mg/dL — ABNORMAL LOW (ref 2.5–4.6)

## 2024-08-27 LAB — SURGICAL PATHOLOGY

## 2024-08-27 LAB — MAGNESIUM: Magnesium: 2 mg/dL (ref 1.7–2.4)

## 2024-08-27 MED ORDER — FUROSEMIDE 10 MG/ML IJ SOLN
40.0000 mg | Freq: Once | INTRAMUSCULAR | Status: AC
  Administered 2024-08-27: 09:00:00 40 mg via INTRAVENOUS

## 2024-08-27 MED ORDER — WARFARIN SODIUM 2.5 MG PO TABS
2.5000 mg | ORAL_TABLET | Freq: Once | ORAL | Status: AC
  Administered 2024-08-27: 16:00:00 2.5 mg via ORAL
  Filled 2024-08-27: qty 1

## 2024-08-27 MED ORDER — AMIODARONE IV BOLUS ONLY 150 MG/100ML
150.0000 mg | Freq: Once | INTRAVENOUS | Status: AC
  Administered 2024-08-27: 19:00:00 150 mg via INTRAVENOUS
  Filled 2024-08-27: qty 100

## 2024-08-27 MED ORDER — K PHOS MONO-SOD PHOS DI & MONO 155-852-130 MG PO TABS
500.0000 mg | ORAL_TABLET | ORAL | Status: AC
  Administered 2024-08-27 (×3): 500 mg via ORAL
  Filled 2024-08-27 (×3): qty 2

## 2024-08-27 MED ORDER — INSULIN ASPART 100 UNIT/ML IJ SOLN
0.0000 [IU] | Freq: Three times a day (TID) | INTRAMUSCULAR | Status: DC
  Administered 2024-08-27 – 2024-08-28 (×4): 4 [IU] via SUBCUTANEOUS
  Administered 2024-08-28: 07:00:00 3 [IU] via SUBCUTANEOUS
  Administered 2024-08-29: 4 [IU] via SUBCUTANEOUS
  Filled 2024-08-27 (×2): qty 4
  Filled 2024-08-27: qty 3
  Filled 2024-08-27: qty 4
  Filled 2024-08-27: qty 1
  Filled 2024-08-27: qty 2

## 2024-08-27 MED ORDER — AMIODARONE HCL IN DEXTROSE 360-4.14 MG/200ML-% IV SOLN
30.0000 mg/h | INTRAVENOUS | Status: DC
  Administered 2024-08-27 – 2024-08-28 (×2): 30 mg/h via INTRAVENOUS
  Filled 2024-08-27 (×3): qty 200

## 2024-08-27 MED ORDER — INSULIN ASPART 100 UNIT/ML IJ SOLN: 0.0000 [IU] | Freq: Every day | INTRAMUSCULAR | Status: DC

## 2024-08-27 MED ORDER — INSULIN GLARGINE 100 UNIT/ML ~~LOC~~ SOLN
18.0000 [IU] | Freq: Every day | SUBCUTANEOUS | Status: DC
  Administered 2024-08-27 – 2024-08-30 (×4): 18 [IU] via SUBCUTANEOUS
  Filled 2024-08-27 (×4): qty 0.18

## 2024-08-27 MED ORDER — AMIODARONE HCL IN DEXTROSE 360-4.14 MG/200ML-% IV SOLN
60.0000 mg/h | INTRAVENOUS | Status: AC
  Administered 2024-08-27: 08:00:00 60 mg/h via INTRAVENOUS
  Filled 2024-08-27: qty 200

## 2024-08-27 MED ORDER — AMIODARONE HCL IN DEXTROSE 360-4.14 MG/200ML-% IV SOLN: 60.0000 mg/h | INTRAVENOUS | Status: DC

## 2024-08-27 MED ORDER — FUROSEMIDE 10 MG/ML IJ SOLN
40.0000 mg | Freq: Once | INTRAMUSCULAR | Status: DC
  Filled 2024-08-27: qty 4

## 2024-08-27 MED ORDER — AMIODARONE IV BOLUS ONLY 150 MG/100ML
150.0000 mg | Freq: Once | INTRAVENOUS | Status: AC
  Administered 2024-08-27: 08:00:00 150 mg via INTRAVENOUS
  Filled 2024-08-27: qty 100

## 2024-08-27 NOTE — Progress Notes (Incomplete)
 08/27/2024   I have seen and evaluated the patient for postop and agree with APP's exam and plan below with following additions:  Afib intermittent RVR Slept intermittently Pain better controlled today Exam looks good not much edema, chest tube ~250/24h Plan today dc a line, foley, cvl If < 200 out of drains by noon, remove Push mobility and up in chair Multimodal pain control Rebolusing amio and continuing gtt    My cc time: *** minutes   Rolan Sharps MD [facility] Prefer epic messenger for cross cover needs

## 2024-08-27 NOTE — Plan of Care (Signed)

## 2024-08-27 NOTE — Progress Notes (Addendum)
 NAME:  Chris Skinner, MRN:  [id number], DOB:  08-08-1966, LOS: 2 ADMISSION DATE:  08/25/2024 CONSULTATION DATE:  08/25/2024 REFERRING MD:  Dr. Daniel CHIEF COMPLAINT:  CAD   History of Present Illness:  Seen in consultation at the request of Dr. Daniel (TCTS) for management of mechanical ventilation and hemodynamics post-CABG.  58 year old male who presented to [facility] 08/25/2024 for scheduled CABG. PMHx significant for severe aortic stenosis and coronary artery disease, OSA on home CPAP, with active tobacco use, Hyperlipidemia, Type 2 DM. Per CTS documentation He has a bicuspid aortic valve with mild AI and severe AS. 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 His main symptom is fatigue. His heart function is preserved. Planned CABG, no good targets to complete, underwent aortic valve replacement with OnX mechanical. Intra-op given DDAVP  and TXA for oozing. EBI 667.   On arrival to ICU patient on 2 mcg/min levophed . Has received albumin  x 2. CO 5.5, CI 2.5. Precedex  on 0.7. Given 2 mg versed . Pacerwires in place. On DDD at 54. Underlying sinus bradycardia.     Pertinent Medical History:  Severe aortic stenosis and coronary artery disease, OSA on home CPAP, with active tobacco use, Hyperlipidemia, Type 2 DM.  Significant Hospital Events: Including procedures, antibiotic start and stop dates in addition to other pertinent events   12/15 AVR 12/16 extubated, on norepi 12/17 off norepi, amio for atrial fibrillation, liberalizing from lines   Interim History / Subjective:  Amiodarone  expired overnight, remains in afib  Pain slightly improved today Passing gas but no bowel movement yet  Objective:  Blood pressure (!) 146/79, pulse 96, temperature 97.7 F (36.5 C), temperature source Oral, resp. rate (!) 26, height 6' 1.5 (1.867 m), weight 105.8 kg, SpO2 91%. CVP:  [3 mmHg-19 mmHg] 6 mmHg CO:  [6.8 L/min-8.5 L/min] 6.8 L/min CI:  [3.1 L/min/m2-3.9 L/min/m2] 3.1 L/min/m2       Intake/Output Summary (Last 24 hours) at 08/27/2024 9196 Last data filed at 08/27/2024 0700 Gross per 24 hour  Intake 929.99 ml  Output 1675 ml  Net -745.01 ml   Filed Weights   08/25/24 0600 08/26/24 0500 08/27/24 0600  Weight: 99.8 kg 106.8 kg 105.8 kg    General:  well nourished M Sting in bed in no acute distress HEENT: MM pink/moist Neuro: Alert and oriented moving all extremities CV: s1s2 rrr, no m/r/g, sternotomy dressing clean and dry PULM:  clear bilaterally on [place] GI: soft, non-tender to palpation, mildly distended Extremities: warm/dry, trace peripheral edema     Resolved Hospital Problem List:   Postoperative vent management  Assessment & Plan:   Severe Aortic Stenosis s/p AVR CAD Hyperlipidemia  Postop atrial fibrillation -Management per T CTS, arterial line and central line and Foley out today -Pull mediastinal drains, leave left pleural drain in place - Monitor CI, CO - Maintain MAP >65 now weaned off pressors - Continue ASA -Metoprolol  initiated -start SQ heparin  tonight  -Rebolus amiodarone  and continue drip at 60 mg/h, trend electrolytes and replete as needed -Pacing wires -Lasix  40 mg, monitor response - Monitor Chest tube output  - Continue Crestor  - Continue multimodal pain medication, feels improved with increased oxycodone , Robaxin , tramadol  and lidocaine  patch   Hx OSA with CPAP at HS Active Tobacco Use  -encourage mobilization and IS -CPAP at bedtime  -nicotine  patch  -advance diet    Type 2 DM -A1c 6.0 - Trend glucose -Continue Lantus  and sliding scale, may need increased if glucose remains above goal 140-180  Labs:  CBC: Recent Labs  Lab 08/25/24 1506 08/25/24 1507 08/25/24 1650 08/25/24 2059 08/26/24 0506 08/26/24 1626 08/27/24 0544  WBC 22.1*  --   --  18.0* 17.7* 19.9* 16.6*  HGB 11.9*   < > 10.2* 11.1* 11.1* 11.2* 10.7*  HCT 33.5*   < > 30.0* 31.3* 31.2* 31.4* 30.3*  MCV 90.1  --   --  90.5 89.9 92.4 90.4   PLT 179  --   --  233 233 202 203   < > = values in this interval not displayed.    Basic Metabolic Panel: Recent Labs  Lab 08/21/24 1057 08/25/24 0758 08/25/24 1302 08/25/24 1305 08/25/24 1650 08/25/24 2059 08/26/24 0506 08/26/24 1355 08/26/24 1626 08/27/24 0544  NA 138   < > 143   < > 143 141 135 134*  --  132*  K 4.1   < > 3.7   < > 3.9 3.9 3.7 4.5  --  4.2  CL 100   < > 105  --   --  105 104 102  --  100  CO2 30  --   --   --   --  26 26 22   --  24  GLUCOSE 124*   < > 119*  --   --  158* 125* 166*  --  159*  BUN 18   < > 17  --   --  17 17 19   --  22*  CREATININE 0.84   < > 0.60*  --   --  0.73 0.62 0.69  --  0.65  CALCIUM  9.5  --   --   --   --  7.6* 7.3* 8.2*  --  8.4*  MG  --   --   --   --   --  2.2 2.2 2.1 2.1 2.0  PHOS  --   --   --   --   --   --  2.9  --   --  1.5*   < > = values in this interval not displayed.   GFR: Estimated Creatinine Clearance: 126.4 mL/min (by C-G formula based on SCr of 0.65 mg/dL). Recent Labs  Lab 08/25/24 2059 08/26/24 0506 08/26/24 1626 08/27/24 0544  WBC 18.0* 17.7* 19.9* 16.6*    Liver Function Tests: Recent Labs  Lab 08/21/24 1057  AST 30  ALT 27  ALKPHOS 46  BILITOT 1.1  PROT 7.3  ALBUMIN  4.4   No results for input(s): LIPASE, AMYLASE in the last 168 hours. No results for input(s): AMMONIA in the last 168 hours.  ABG    Component Value Date/Time   PHART 7.351 08/25/2024 1650   PCO2ART 36.1 08/25/2024 1650   PO2ART 71 (L) 08/25/2024 1650   HCO3 20.0 08/25/2024 1650   TCO2 21 (L) 08/25/2024 1650   ACIDBASEDEF 5.0 (H) 08/25/2024 1650   O2SAT 93 08/25/2024 1650     Coagulation Profile: Recent Labs  Lab 08/21/24 1057 08/25/24 1506 08/26/24 0731 08/27/24 0544  INR 1.0 1.4* 1.3* 1.3*    Cardiac Enzymes: No results for input(s): CKTOTAL, CKMB, CKMBINDEX, TROPONINI in the last 168 hours.  HbA1C: Hemoglobin A1C  Date/Time Value Ref Range Status  03/25/2024 09:12 AM 6.6 (A) 4.0 - 5.6 %  Final  09/25/2023 03:36 PM 6.1 (A) 4.0 - 5.6 % Final   Hgb A1c MFr Bld  Date/Time Value Ref Range Status  08/21/2024 10:58 AM 6.0 (H) 4.8 - 5.6 % Final    Comment:    (  NOTE) Diagnosis of Diabetes The following HbA1c ranges recommended by the American Diabetes Association (ADA) may be used as an aid in the diagnosis of diabetes mellitus.  Hemoglobin             Suggested A1C NGSP%              Diagnosis  <5.7                   Non Diabetic  5.7-6.4                Pre-Diabetic  >6.4                   Diabetic  <7.0                   Glycemic control for                       adults with diabetes.    06/18/2017 01:12 PM 5.3 <5.7 % of total Hgb Final    Comment:    For the purpose of screening for the presence of diabetes: . <5.7%       Consistent with the absence of diabetes 5.7-6.4%    Consistent with increased risk for diabetes             (prediabetes) > or =6.5%  Consistent with diabetes . This assay result is consistent with a decreased risk of diabetes. . Currently, no consensus exists regarding use of hemoglobin A1c for diagnosis of diabetes in children. . According to American Diabetes Association (ADA) guidelines, hemoglobin A1c <7.0% represents optimal control in non-pregnant diabetic patients. Different metrics may apply to specific patient populations.  Standards of Medical Care in Diabetes(ADA). .     CBG: Recent Labs  Lab 08/26/24 1634 08/26/24 1951 08/26/24 2313 08/27/24 0459 08/27/24 0744  GLUCAP 185* 174* 188* 166* 211*    Review of Systems:   Unable to review, intubated/sedated  Past Medical History:  He,  has a past medical history of Chest pain (2001), Coronary artery disease, Diabetes mellitus without complication (HCC), Dyslipidemia, H/O echocardiogram (09/2013), Heart murmur, Hyperlipidemia, Hypertension, Personal history of colonic polyp - adenoma (01/05/2014), Sleep apnea, Tobacco use disorder, and Wears glasses.   Surgical  History:   Past Surgical History:  Procedure Laterality Date   AORTIC VALVE REPLACEMENT N/A 08/25/2024   Procedure: REPLACEMENT, AORTIC VALVE, OPEN USING On-X PROSTHETIC HEART VALVE;  Surgeon: Daniel Con RAMAN, MD;  Location: [facility] OR;  Service: Open Heart Surgery;  Laterality: N/A;   BIOPSY  01/17/2021   Procedure: BIOPSY;  Surgeon: Aneita Gwendlyn DASEN, MD;  Location: [facility] ENDOSCOPY;  Service: Endoscopy;;   COLONOSCOPY  2015   CORONARY ANGIOGRAPHY N/A 08/05/2024   Procedure: CORONARY ANGIOGRAPHY;  Surgeon: Wonda Sharper, MD;  Location: [facility] INVASIVE CV LAB;  Service: Cardiovascular;  Laterality: N/A;   CORONARY ARTERY BYPASS GRAFT N/A 08/25/2024   Procedure: ENDOSCOPICALLY HARVESTED RIGHT GREATER SAPHENOUS VEIN;  Surgeon: Daniel Con RAMAN, MD;  Location: [facility] OR;  Service: Open Heart Surgery;  Laterality: N/A;   ESOPHAGOGASTRODUODENOSCOPY (EGD) WITH PROPOFOL  N/A 01/17/2021   Procedure: ESOPHAGOGASTRODUODENOSCOPY (EGD) WITH PROPOFOL ;  Surgeon: Aneita Gwendlyn DASEN, MD;  Location: [facility] ENDOSCOPY;  Service: Endoscopy;  Laterality: N/A;   FOREIGN BODY REMOVAL  01/17/2021   Procedure: FOREIGN BODY REMOVAL;  Surgeon: Aneita Gwendlyn DASEN, MD;  Location: [facility] ENDOSCOPY;  Service: Endoscopy;;   INTRAOPERATIVE TRANSESOPHAGEAL ECHOCARDIOGRAM N/A 08/25/2024   Procedure: ECHOCARDIOGRAM, TRANSESOPHAGEAL,  INTRAOPERATIVE;  Surgeon: Daniel Con RAMAN, MD;  Location: [facility] OR;  Service: Open Heart Surgery;  Laterality: N/A;   KNEE ARTHROSCOPY Left 1985   TONSILLECTOMY AND ADENOIDECTOMY  1974     Social History:   reports that he has quit smoking. His smoking use included cigarettes. He quit smokeless tobacco use about 16 years ago.  His smokeless tobacco use included chew. He reports current alcohol use of about 10.0 standard drinks of alcohol per week. He reports that he does not use drugs.   Family History:  His family history includes Arthritis in his mother; Crohn's disease in his sister; Diabetes in his father and paternal grandfather;  Heart disease in his father and mother; Hyperlipidemia in his mother; Stroke in his mother. There is no history of Cancer, Colon cancer, Colon polyps, Esophageal cancer, Stomach cancer, or Rectal cancer.   Allergies: Allergies[1]   Home Medications: Prior to Admission medications  Medication Sig Start Date End Date Taking? Authorizing Provider  amLODipine  (NORVASC ) 5 MG tablet TAKE 1 TABLET (5 MG TOTAL) BY MOUTH DAILY. 06/25/24  Yes [name], MD  aspirin  EC 81 MG tablet Take 1 tablet (81 mg total) by mouth daily. Swallow whole. 07/29/24  Yes O'Neal, Darryle Ned, MD  cetirizine (ZYRTEC) 10 MG chewable tablet Chew 10 mg by mouth in the morning.   Yes [provider]  dapagliflozin  propanediol (FARXIGA ) 5 MG TABS tablet Take 1 tablet (5 mg total) by mouth daily before breakfast. 05/30/24  Yes [name], MD  ezetimibe  (ZETIA ) 10 MG tablet TAKE 1 TABLET BY MOUTH EVERY DAY 06/25/24  Yes [name], MD  Investigational - Study Medication Take 1 tablet by mouth daily. Study name:  PURSUIT Phase IIb trial for [employer]'s [id number] Additional study details: [id number] 30 mg or placebo   Yes [provider]  lisinopril -hydrochlorothiazide  (ZESTORETIC ) 20-12.5 MG tablet TAKE 1 TABLET BY MOUTH EVERY DAY 06/25/24  Yes Lalonde, John C, MD  Multiple Vitamins-Minerals (MULTIVITAMIN WITH MINERALS) tablet Take 2 tablets by mouth in the morning.   Yes [provider]  Naproxen Sodium 220 MG CAPS Take 440 mg by mouth in the morning.   Yes [provider]  rosuvastatin  (CRESTOR ) 40 MG tablet TAKE 1 TABLET BY MOUTH EVERY DAY 06/25/24  Yes [name], MD     Critical care time: 33 minutes   CRITICAL CARE Performed by: Leita SAUNDERS Jamiah Homeyer   Total critical care time: 33 minutes  Critical care time was exclusive of separately billable procedures and treating other patients.  Critical care was necessary to treat or prevent imminent or life-threatening  deterioration.  Critical care was time spent personally by me on the following activities: development of treatment plan with patient and/or surrogate as well as nursing, discussions with consultants, evaluation of patient's response to treatment, examination of patient, obtaining history from patient or surrogate, ordering and performing treatments and interventions, ordering and review of laboratory studies, ordering and review of radiographic studies, pulse oximetry and re-evaluation of patient's condition.   Leita SAUNDERS Jagger Demonte, PA-C [facility] See Amion for pager If no response to pager , please call [phone] until 7pm After 7:00 pm call Elink  [phone]        [1] No Known Allergies

## 2024-08-27 NOTE — Progress Notes (Signed)
2 Days Post-Op Procedures (LRB): REPLACEMENT, AORTIC VALVE, OPEN USING On-X PROSTHETIC HEART VALVE (N/A) ENDOSCOPICALLY HARVESTED RIGHT GREATER SAPHENOUS VEIN (N/A) ECHOCARDIOGRAM, TRANSESOPHAGEAL, INTRAOPERATIVE (N/A) Subjective: Off levo this morning, walked 2 laps Remains in Afib, amio gtt off at midnight Pain control is improved Modest response to lasix  last night  Objective: Vital signs in last 24 hours: BP (!) 146/79   Pulse 96   Temp 97.7 F (36.5 C) (Oral)   Resp (!) 26   Ht 6' 1.5 (1.867 m)   Wt 105.8 kg   SpO2 91%   BMI 30.36 kg/m  Filed Weights   08/25/24 0600 08/26/24 0500 08/27/24 0600  Weight: 99.8 kg 106.8 kg 105.8 kg    Hemodynamic parameters for last 24 hours: CVP:  [3 mmHg-19 mmHg] 6 mmHg CO:  [6.8 L/min-8.5 L/min] 6.8 L/min CI:  [3.1 L/min/m2-3.9 L/min/m2] 3.1 L/min/m2  Intake/Output from previous day: 12/16 0701 - 12/17 0700 In: 1003.8 [I.V.:573.4; IV Piggyback:430.4] Out: 1780 [Urine:1270; Chest Tube:510] Intake/Output this shift: No intake/output data recorded.  Physical Exam: General - Resting comfortably in bed CV - A fib 80-90s Resp - Unlabored on room air; chest tube output serosanguinous Abd - Soft, ND/NT Ext - Mild edema  Lab Results:    Latest Ref Rng & Units 08/27/2024    5:44 AM 08/26/2024    4:26 PM 08/26/2024    5:06 AM  CBC  WBC 4.0 - 10.5 K/uL 16.6  19.9  17.7   Hemoglobin 13.0 - 17.0 g/dL 89.2  88.7  88.8   Hematocrit 39.0 - 52.0 % 30.3  31.4  31.2   Platelets 150 - 400 K/uL 203  202  233       Latest Ref Rng & Units 08/27/2024    5:44 AM 08/26/2024    1:55 PM 08/26/2024    5:06 AM  CMP  Glucose 70 - 99 mg/dL 840  833  874   BUN 6 - 20 mg/dL 22  19  17    Creatinine 0.61 - 1.24 mg/dL 9.34  9.30  9.37   Sodium 135 - 145 mmol/L 132  134  135   Potassium 3.5 - 5.1 mmol/L 4.2  4.5  3.7   Chloride 98 - 111 mmol/L 100  102  104   CO2 22 - 32 mmol/L 24  22  26    Calcium  8.9 - 10.3 mg/dL 8.4  8.2  7.3      CXR: Stable, No pneumothorax or effusion  Assessment/Plan: S/P Procedures (LRB): REPLACEMENT, AORTIC VALVE, OPEN USING On-X PROSTHETIC HEART VALVE (N/A) ENDOSCOPICALLY HARVESTED RIGHT GREATER SAPHENOUS VEIN (N/A) ECHOCARDIOGRAM, TRANSESOPHAGEAL, INTRAOPERATIVE (N/A) POD2 s/p mechanical AVR NEURO- intact  Pain control PRN Deconditioning- PT/OT CV- in Afib 80-90s - repeat amio bolus and drip this morning             Cap pacing wires  Remove arterial and central lines. RESP- Improved lung aeration             Continue IS, pulm hygiene, ambulation  Remove meds, keep left pleural RENAL- creatinine and lytes Ok  Weight up 15 lbs compared to pre-op             Lasix  IV 40 x 1 this morning, remove foley GI- Continue diet  BM: None since surgery Endo- BG well controlled Transition to  ACHS ISS ID- PPX DVT ppx - SCD + 2.5 mg coumadin  tonight  Dispo: ICU   LOS: 2 days    Con S Kayd Launer  08/27/2024 ° °

## 2024-08-27 NOTE — Progress Notes (Signed)
 PHARMACY - ANTICOAGULATION CONSULT NOTE  Pharmacy Consult for Warfarin Indication: on-X valve  Allergies[1]  Patient Measurements: Height: 6' 1.5 (186.7 cm) Weight: 105.8 kg (233 lb 4 oz) IBW/kg (Calculated) : 81.05 HEPARIN  DW (KG): 99.8  Vital Signs: Temp: 97.7 F (36.5 C) (12/17 0743) Temp Source: Oral (12/17 0743) BP: 146/79 (12/17 0700) Pulse Rate: 96 (12/17 0700)  Labs: Recent Labs    08/25/24 1506 08/25/24 1507 08/26/24 0506 08/26/24 0731 08/26/24 1355 08/26/24 1626 08/27/24 0544  HGB 11.9*   < > 11.1*  --   --  11.2* 10.7*  HCT 33.5*   < > 31.2*  --   --  31.4* 30.3*  PLT 179   < > 233  --   --  202 203  APTT 32  --   --   --   --   --   --   LABPROT 18.1*  --   --  16.6*  --   --  17.3*  INR 1.4*  --   --  1.3*  --   --  1.3*  CREATININE  --    < > 0.62  --  0.69  --  0.65   < > = values in this interval not displayed.    Estimated Creatinine Clearance: 126.4 mL/min (by C-G formula based on SCr of 0.65 mg/dL).   Medical History: Past Medical History:  Diagnosis Date   Chest pain 2001   [place], normal cardiac workup   Coronary artery disease    Diabetes mellitus without complication (HCC)    Dyslipidemia    H/O echocardiogram 09/2013   PFO found incidentally.  Echo done due to murmur   Heart murmur    mild aortic stenosis   Hyperlipidemia    Hypertension    Personal history of colonic polyp - adenoma 01/05/2014   Sleep apnea    cpap   Tobacco use disorder    Wears glasses     Medications:  Scheduled:   acetaminophen   1,000 mg Oral Q6H   Or   acetaminophen  (TYLENOL ) oral liquid 160 mg/5 mL  1,000 mg Per Tube Q6H   aspirin  EC  325 mg Oral Daily   Or   aspirin   324 mg Per Tube Daily   bisacodyl   10 mg Oral Daily   Or   bisacodyl   10 mg Rectal Daily   Chlorhexidine  Gluconate Cloth  6 each Topical Daily   docusate sodium   200 mg Oral Daily   ezetimibe   10 mg Oral Daily   furosemide   40 mg Intravenous Once   insulin  aspart  0-24 Units  Subcutaneous Q4H   insulin  glargine  15 Units Subcutaneous Daily   lidocaine   1 patch Transdermal Q24H   methocarbamol   1,000 mg Oral Q8H   metoprolol  tartrate  12.5 mg Oral BID   Or   metoprolol  tartrate  12.5 mg Per Tube BID   nicotine   14 mg Transdermal Daily   pantoprazole   40 mg Oral Daily   rosuvastatin   40 mg Oral Daily   sodium chloride  flush  3 mL Intravenous Q12H   Warfarin - Pharmacist Dosing Inpatient   Does not apply q1600   Infusions:   albumin  human Stopped (08/25/24 2230)   amiodarone      amiodarone      norepinephrine  (LEVOPHED ) Adult infusion 4 mcg/min (08/27/24 0700)   PRN: albumin  human, dextrose , metoprolol  tartrate, morphine  injection, ondansetron  (ZOFRAN ) IV, oxyCODONE , sodium chloride  flush, traMADol   Assessment: 58 yo male s/p elective  CABG with aortic valve replacement with On-X mechanical. Warfarin per pharmacy started POD1. INR 1.3, CBC ok.  Goal of Therapy:  INR 2-3 (eventually 1.5-2)   Plan:  Warfarin 2.5mg  PO x 1 Daily PT/INR   Ozell Jamaica, PharmD, BCPS, [facility] Clinical Pharmacist [phone] Please check AMION for all [facility] Pharmacy numbers 08/27/2024       [1] No Known Allergies

## 2024-08-27 NOTE — Evaluation (Signed)
 Physical Therapy Evaluation Patient Details Name: Chris Skinner MRN: [id number] DOB: 1965-09-13 Today's Date: 08/27/2024  History of Present Illness  Pt is a 58 y.o. male who presented 08/25/24 for AVR. PMH: CAD, DM, heart murmur, HLD, HTN, sleep apnea, tobacco use disorder  Clinical Impression  Pt presents with condition above and deficits mentioned below, see PT Problem List. PTA, he was independent without DME, working as a Advertising Account Planner, and living with his mother in a 2-level house with a ramped entrance. He is his mother's caregiver as well, but reports other family and his significant other are going to assist him as he recovers. He can stay on the main level of the house as well. Currently, the pt displays deficits in balance, power, and activity tolerance. He is needing minA for bed mobility and transfers but is able to ambulate without LOB at a supervision level when using a RW for stability. He reports recently spraining his R ankle while playing golf PTA as well, thereby also limiting his standing activity tolerance. He will likely progress quickly as he mobilizes more frequently and as his pain improves, therefore post acute PT is not expected. He could benefit from Cardiac Rehab follow-up though. Will continue to follow acutely to maximize his return to baseline prior to d/c home.        If plan is discharge home, recommend the following: A little help with walking and/or transfers;A little help with bathing/dressing/bathroom;Assistance with cooking/housework;Assist for transportation;Help with stairs or ramp for entrance   Can travel by private vehicle        Equipment Recommendations None recommended by PT  Recommendations for Other Services  OT consult    Functional Status Assessment Patient has had a recent decline in their functional status and demonstrates the ability to make significant improvements in function in a reasonable and predictable amount of time.      Precautions / Restrictions Precautions Precautions: Fall;Sternal;Other (comment) Precaution Booklet Issued: No Recall of Precautions/Restrictions: Intact Precaution/Restrictions Comments: watch SpO2; Y-chest tube; reviewed sternal precautions Restrictions Weight Bearing Restrictions Per Provider Order: Yes Other Position/Activity Restrictions: sternal precautions      Mobility  Bed Mobility Overal bed mobility: Needs Assistance Bed Mobility: Sit to Sidelying         Sit to sidelying: Min assist General bed mobility comments: Cued pt to lean laterally to L to descend trunk and minA needed to complete leg lift onto bed    Transfers Overall transfer level: Needs assistance Equipment used: Rolling walker (2 wheels) Transfers: Sit to/from Stand Sit to Stand: Min assist           General transfer comment: Cued pt to lean anteriorly and rock to gain momentum, minA to power up to stand from recliner    Ambulation/Gait Ambulation/Gait assistance: Contact guard assist, Supervision Gait Distance (Feet): 390 Feet Assistive device: Rolling walker (2 wheels) Gait Pattern/deviations: Step-through pattern, Decreased stride length, Antalgic Gait velocity: reduced Gait velocity interpretation: <1.8 ft/sec, indicate of risk for recurrent falls   General Gait Details: Pt ambulates slowly with a very mild antalgic gait pattern due to a recent R ankle sprain PTA. No overt LOB, progressing from CGA to supervision quickly  Stairs            Wheelchair Mobility     Tilt Bed    Modified Rankin (Stroke Patients Only)       Balance Overall balance assessment: Needs assistance Sitting-balance support: No upper extremity supported, Feet supported Sitting  balance-Leahy Scale: Fair Sitting balance - Comments: static sitting EOB with supervision for safety   Standing balance support: Bilateral upper extremity supported, During functional activity, Reliant on assistive device for  balance, No upper extremity supported Standing balance-Leahy Scale: Poor Standing balance comment: reliant on UE support to ambulate, able to stand statically without UE support                             Pertinent Vitals/Pain Pain Assessment Pain Assessment: 0-10 Pain Score: 7  Pain Location: sternum Pain Descriptors / Indicators: Discomfort, Grimacing, Operative site guarding Pain Intervention(s): Limited activity within patient's tolerance, Monitored during session, Repositioned    Home Living Family/patient expects to be discharged to:: Private residence Living Arrangements: Parent (mother, pt is caregiver for mother) Available Help at Discharge: Friend(s);Family;Available 24 hours/day Type of Home: House Home Access: Ramped entrance     Alternate Level Stairs-Number of Steps: flight Home Layout: Two level;Bed/bath upstairs;Able to live on main level with bedroom/bathroom;Full bath on main level Home Equipment: Rollator (4 wheels);BSC/3in1;Shower seat;Grab bars - toilet;Grab bars - tub/shower;Wheelchair - manual;Cane - single point;Crutches      Prior Function Prior Level of Function : Independent/Modified Independent;Driving;Working/employed             Mobility Comments: No AD ADLs 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     Extremity/Trunk Assessment   Upper Extremity Assessment Upper Extremity Assessment: Defer to OT evaluation    Lower Extremity Assessment Lower Extremity Assessment: Overall WFL for tasks assessed;RLE deficits/detail RLE Deficits / Details: reports recent ankle sprain    Cervical / Trunk Assessment Cervical / Trunk Assessment: Other exceptions Cervical / Trunk Exceptions: sternal precautions  Communication   Communication Communication: No apparent difficulties    Cognition Arousal: Alert Behavior During Therapy: WFL for tasks assessed/performed   PT - Cognitive impairments: No apparent impairments                        PT - Cognition Comments: Recalls sternal precautions and complies to them well Following commands: Intact       Cueing Cueing Techniques: Verbal cues     General Comments General comments (skin integrity, edema, etc.): Pleth unreliable throughout but when reliable, pt needed 3-4L to maintain >/= 90%    Exercises     Assessment/Plan    PT Assessment Patient needs continued PT services  PT Problem List Decreased activity tolerance;Decreased balance;Decreased mobility;Cardiopulmonary status limiting activity       PT Treatment Interventions DME instruction;Gait training;Stair training;Functional mobility training;Therapeutic activities;Therapeutic exercise;Balance training;Neuromuscular re-education;Patient/family education    PT Goals (Current goals can be found in the Care Plan section)  Acute Rehab PT Goals Patient Stated Goal: to improve PT Goal Formulation: With patient/family Time For Goal Achievement: 09/10/24 Potential to Achieve Goals: Good    Frequency Min 2X/week     Co-evaluation               AM-PAC PT 6 Clicks Mobility  Outcome Measure Help needed turning from your back to your side while in a flat bed without using bedrails?: A Little Help needed moving from lying on your back to sitting on the side of a flat bed without using bedrails?: A Little Help needed moving to and from a bed to a chair (including a wheelchair)?: A Little Help needed standing up from a chair using your arms (e.g., wheelchair or bedside chair)?: A  Little Help needed to walk in hospital room?: A Little Help needed climbing 3-5 steps with a railing? : A Little 6 Click Score: 18    End of Session Equipment Utilized During Treatment: Oxygen Activity Tolerance: Patient tolerated treatment well Patient left: in bed;with call bell/phone within reach;with bed alarm set;with family/visitor present   PT Visit Diagnosis: Unsteadiness on feet (R26.81);Other  abnormalities of gait and mobility (R26.89);Difficulty in walking, not elsewhere classified (R26.2);Pain Pain - Right/Left:  (sternum) Pain - part of body:  (sternum)    Time: 8793-8767 PT Time Calculation (min) (ACUTE ONLY): 26 min   Charges:   PT Evaluation $PT Eval Moderate Complexity: 1 Mod PT Treatments $Therapeutic Activity: 8-22 mins PT General Charges $$ ACUTE PT VISIT: 1 Visit         Theo Ferretti, PT, DPT Acute Rehabilitation Services  Office: [phone]   Theo CHRISTELLA Ferretti 08/27/2024, 12:45 PM

## 2024-08-27 NOTE — Plan of Care (Signed)

## 2024-08-28 ENCOUNTER — Inpatient Hospital Stay (HOSPITAL_COMMUNITY)

## 2024-08-28 DIAGNOSIS — I97190 Other postprocedural cardiac functional disturbances following cardiac surgery: Secondary | ICD-10-CM

## 2024-08-28 LAB — BASIC METABOLIC PANEL WITH GFR
Anion gap: 9 (ref 5–15)
BUN: 24 mg/dL — ABNORMAL HIGH (ref 6–20)
CO2: 27 mmol/L (ref 22–32)
Calcium: 8.5 mg/dL — ABNORMAL LOW (ref 8.9–10.3)
Chloride: 98 mmol/L (ref 98–111)
Creatinine, Ser: 0.63 mg/dL (ref 0.61–1.24)
GFR, Estimated: >60 mL/min (ref >60)
Glucose, Bld: 124 mg/dL — ABNORMAL HIGH (ref 70–99)
Potassium: 3.4 mmol/L — ABNORMAL LOW (ref 3.5–5.1)
Sodium: 134 mmol/L — ABNORMAL LOW (ref 135–145)

## 2024-08-28 LAB — GLUCOSE, CAPILLARY
Glucose-Capillary: 133 mg/dL — ABNORMAL HIGH (ref 70–99)
Glucose-Capillary: 153 mg/dL — ABNORMAL HIGH (ref 70–99)
Glucose-Capillary: 134 mg/dL — ABNORMAL HIGH (ref 70–99)

## 2024-08-28 LAB — CBC
HCT: 27.7 % — ABNORMAL LOW (ref 39.0–52.0)
Hemoglobin: 10.2 g/dL — ABNORMAL LOW (ref 13.0–17.0)
MCH: 32.8 pg (ref 26.0–34.0)
MCHC: 36.8 g/dL — ABNORMAL HIGH (ref 30.0–36.0)
MCV: 89.1 fL (ref 80.0–100.0)
Platelets: 177 K/uL (ref 150–400)
RBC: 3.11 MIL/uL — ABNORMAL LOW (ref 4.22–5.81)
RDW: 11.6 % (ref 11.5–15.5)
WBC: 12.1 K/uL — ABNORMAL HIGH (ref 4.0–10.5)
nRBC: 0 % (ref 0.0–0.2)

## 2024-08-28 LAB — MAGNESIUM: Magnesium: 2 mg/dL (ref 1.7–2.4)

## 2024-08-28 LAB — PROTIME-INR
INR: 1.4 — ABNORMAL HIGH (ref 0.8–1.2)
Prothrombin Time: 17.7 s — ABNORMAL HIGH (ref 11.4–15.2)

## 2024-08-28 LAB — PHOSPHORUS: Phosphorus: 1.7 mg/dL — ABNORMAL LOW (ref 2.5–4.6)

## 2024-08-28 MED ORDER — POTASSIUM CHLORIDE CRYS ER 20 MEQ PO TBCR
20.0000 meq | EXTENDED_RELEASE_TABLET | ORAL | Status: AC
  Administered 2024-08-28 (×3): 20 meq via ORAL
  Filled 2024-08-28 (×3): qty 1

## 2024-08-28 MED ORDER — ACETAMINOPHEN 325 MG PO TABS: 650.0000 mg | ORAL_TABLET | Freq: Four times a day (QID) | ORAL | Status: DC

## 2024-08-28 MED ORDER — SODIUM CHLORIDE 0.9% FLUSH: 3.0000 mL | INTRAVENOUS | Status: DC | PRN

## 2024-08-28 MED ORDER — ~~LOC~~ CARDIAC SURGERY, PATIENT & FAMILY EDUCATION: Freq: Once | Status: AC

## 2024-08-28 MED ORDER — WARFARIN SODIUM 5 MG PO TABS
5.0000 mg | ORAL_TABLET | Freq: Once | ORAL | Status: AC
  Administered 2024-08-28: 16:00:00 5 mg via ORAL
  Filled 2024-08-28: qty 1

## 2024-08-28 MED ORDER — SODIUM CHLORIDE 0.9% FLUSH
3.0000 mL | Freq: Two times a day (BID) | INTRAVENOUS | Status: DC
  Administered 2024-08-28 – 2024-08-30 (×4): 3 mL via INTRAVENOUS

## 2024-08-28 MED ORDER — AMIODARONE HCL 200 MG PO TABS
400.0000 mg | ORAL_TABLET | Freq: Two times a day (BID) | ORAL | Status: DC
  Administered 2024-08-28 – 2024-08-30 (×5): 400 mg via ORAL
  Filled 2024-08-28 (×5): qty 2

## 2024-08-28 MED ORDER — SODIUM CHLORIDE 0.9 % IV SOLN: 250.0000 mL | INTRAVENOUS | Status: AC | PRN

## 2024-08-28 MED ORDER — FUROSEMIDE 10 MG/ML IJ SOLN
60.0000 mg | Freq: Once | INTRAMUSCULAR | Status: AC
  Administered 2024-08-28: 08:00:00 60 mg via INTRAVENOUS
  Filled 2024-08-28: qty 6

## 2024-08-28 MED ORDER — LACTULOSE 10 GM/15ML PO SOLN
30.0000 g | Freq: Once | ORAL | Status: AC
  Administered 2024-08-28: 08:00:00 30 g via ORAL
  Filled 2024-08-28: qty 45

## 2024-08-28 MED ORDER — ACETAMINOPHEN 325 MG PO TABS
650.0000 mg | ORAL_TABLET | Freq: Four times a day (QID) | ORAL | Status: DC
  Administered 2024-08-28 – 2024-08-30 (×6): 650 mg via ORAL
  Filled 2024-08-28 (×7): qty 2

## 2024-08-28 MED FILL — Sodium Bicarbonate IV Soln 8.4%: INTRAVENOUS | Qty: 50 | Status: AC

## 2024-08-28 MED FILL — Sodium Chloride IV Soln 0.9%: INTRAVENOUS | Qty: 2000 | Status: AC

## 2024-08-28 MED FILL — Heparin Sodium (Porcine) Inj 1000 Unit/ML: INTRAMUSCULAR | Qty: 30 | Status: AC

## 2024-08-28 MED FILL — Mannitol IV Soln 20%: INTRAVENOUS | Qty: 500 | Status: AC

## 2024-08-28 MED FILL — Heparin Sodium (Porcine) Inj 1000 Unit/ML: Qty: 1000 | Status: AC

## 2024-08-28 MED FILL — Electrolyte-R (PH 7.4) Solution: INTRAVENOUS | Qty: 3000 | Status: AC

## 2024-08-28 MED FILL — Potassium Chloride Inj 2 mEq/ML: INTRAVENOUS | Qty: 40 | Status: AC

## 2024-08-28 MED FILL — Lidocaine HCl Local Preservative Free (PF) Inj 2%: INTRAMUSCULAR | Qty: 14 | Status: AC

## 2024-08-28 NOTE — Progress Notes (Addendum)
 3 Days Post-Op Procedures (LRB): REPLACEMENT, AORTIC VALVE, OPEN USING On-X PROSTHETIC HEART VALVE (N/A) ENDOSCOPICALLY HARVESTED RIGHT GREATER SAPHENOUS VEIN (N/A) ECHOCARDIOGRAM, TRANSESOPHAGEAL, INTRAOPERATIVE (N/A) Subjective: Still in rate-controlled A-fib Looks better, ambulated this morning Chest tube output thin but still elevated Modest response to lasix  yesterday  Objective: Vital signs in last 24 hours: BP (!) 137/93   Pulse 96   Temp 98.8 F (37.1 C) (Oral)   Resp (!) 25   Ht 6' 1.5 (1.867 m)   Wt 105.3 kg   SpO2 94%   BMI 30.21 kg/m  Filed Weights   08/26/24 0500 08/27/24 0600 08/28/24 0626  Weight: 106.8 kg 105.8 kg 105.3 kg    Hemodynamic parameters for last 24 hours:    Intake/Output from previous day: 12/17 0701 - 12/18 0700 In: 638.2 [I.V.:638.2] Out: 1850 [Urine:1410; Chest Tube:440] Intake/Output this shift: No intake/output data recorded.  Physical Exam: General - Resting comfortably in bed CV - A fib 80-90s Resp - Unlabored on room air; chest tube output serosanguinous Abd - Soft, ND/NT Ext - Mild edema  Lab Results:    Latest Ref Rng & Units 08/28/2024    3:22 AM 08/27/2024    5:44 AM 08/26/2024    4:26 PM  CBC  WBC 4.0 - 10.5 K/uL 12.1  16.6  19.9   Hemoglobin 13.0 - 17.0 g/dL 89.7  89.2  88.7   Hematocrit 39.0 - 52.0 % 27.7  30.3  31.4   Platelets 150 - 400 K/uL 177  203  202       Latest Ref Rng & Units 08/28/2024    3:22 AM 08/27/2024    5:44 AM 08/26/2024    1:55 PM  CMP  Glucose 70 - 99 mg/dL 875  840  833   BUN 6 - 20 mg/dL 24  22  19    Creatinine 0.61 - 1.24 mg/dL 9.36  9.34  9.30   Sodium 135 - 145 mmol/L 134  132  134   Potassium 3.5 - 5.1 mmol/L 3.4  4.2  4.5   Chloride 98 - 111 mmol/L 98  100  102   CO2 22 - 32 mmol/L 27  24  22    Calcium  8.9 - 10.3 mg/dL 8.5  8.4  8.2     CXR: Stable, No pneumothorax or effusion  Assessment/Plan: S/P Procedures (LRB): REPLACEMENT, AORTIC VALVE, OPEN USING On-X  PROSTHETIC HEART VALVE (N/A) ENDOSCOPICALLY HARVESTED RIGHT GREATER SAPHENOUS VEIN (N/A) ECHOCARDIOGRAM, TRANSESOPHAGEAL, INTRAOPERATIVE (N/A) POD3 s/p mechanical AVR NEURO- intact  Pain control PRN CV- in Afib 80-90s           Remove pacing wires today RESP- Improved lung aeration             Continue IS, pulm hygiene, ambulation  Keep left pleural for now RENAL- creatinine and lytes Ok  Weight down 2 lbs but still up 11 lbs from preop             Lasix  IV 60 x 1 this morning GI- Continue diet  BM: None since surgery, lactulose  today Endo- BG well controlled ID- PPX DVT ppx - SCD + 5 mg coumadin  tonight  Dispo: 4E today   LOS: 3 days    Con RAMAN Kara Mierzejewski 08/28/2024

## 2024-08-28 NOTE — Progress Notes (Signed)
 OT Cancellation Note  Patient Details Name: NEAMIAH SCIARRA MRN: [id number] DOB: August 25, 1966   Cancelled Treatment:    Reason Eval/Treat Not Completed: Active bedrest order. RN reporting recently pulled pacing wires, and pt is on bedrest for 1 hour. Will follow up as able to.   Alioune Hodgkin C, OT  Acute Rehabilitation Services Office [phone] Secure chat preferred   Adrianne GORMAN Savers 08/28/2024, 8:57 AM

## 2024-08-28 NOTE — Progress Notes (Signed)
 PHARMACY - ANTICOAGULATION CONSULT NOTE  Pharmacy Consult for Warfarin Indication: on-X valve  Allergies[1]  Patient Measurements: Height: 6' 1.5 (186.7 cm) Weight: 105.3 kg (232 lb 2.3 oz) IBW/kg (Calculated) : 81.05 HEPARIN  DW (KG): 99.8  Vital Signs: Temp: 98.7 F (37.1 C) (12/18 0700) Temp Source: Oral (12/18 0700) BP: 133/77 (12/18 1000) Pulse Rate: 81 (12/18 1030)  Labs: Recent Labs    08/26/24 0731 08/26/24 1355 08/26/24 1626 08/27/24 0544 08/28/24 0322  HGB  --   --  11.2* 10.7* 10.2*  HCT  --   --  31.4* 30.3* 27.7*  PLT  --   --  202 203 177  LABPROT 16.6*  --   --  17.3* 17.7*  INR 1.3*  --   --  1.3* 1.4*  CREATININE  --  0.69  --  0.65 0.63    Estimated Creatinine Clearance: 126.1 mL/min (by C-G formula based on SCr of 0.63 mg/dL).   Medical History: Past Medical History:  Diagnosis Date   Chest pain 2001   [facility], normal cardiac workup   Coronary artery disease    Diabetes mellitus without complication (HCC)    Dyslipidemia    H/O echocardiogram 09/2013   PFO found incidentally.  Echo done due to murmur   Heart murmur    mild aortic stenosis   Hyperlipidemia    Hypertension    Personal history of colonic polyp - adenoma 01/05/2014   Sleep apnea    cpap   Tobacco use disorder    Wears glasses     Medications:  Scheduled:   acetaminophen   650 mg Oral Q6H   amiodarone   400 mg Oral BID   aspirin  EC  325 mg Oral Daily   Or   aspirin   324 mg Per Tube Daily   bisacodyl   10 mg Oral Daily   Or   bisacodyl   10 mg Rectal Daily   Chlorhexidine  Gluconate Cloth  6 each Topical Daily   docusate sodium   200 mg Oral Daily   ezetimibe   10 mg Oral Daily   insulin  aspart  0-20 Units Subcutaneous TID WC   insulin  aspart  0-5 Units Subcutaneous QHS   insulin  glargine  18 Units Subcutaneous Daily   lidocaine   1 patch Transdermal Q24H   methocarbamol   1,000 mg Oral Q8H   metoprolol  tartrate  12.5 mg Oral BID   Or   metoprolol  tartrate  12.5 mg  Per Tube BID   nicotine   14 mg Transdermal Daily   pantoprazole   40 mg Oral Daily   potassium chloride   20 mEq Oral Q4H   rosuvastatin   40 mg Oral Daily   sodium chloride  flush  3 mL Intravenous Q12H   warfarin  5 mg Oral ONCE-1600   Warfarin - Pharmacist Dosing Inpatient   Does not apply q1600   Infusions:   norepinephrine  (LEVOPHED ) Adult infusion Stopped (08/27/24 0708)   PRN: dextrose , metoprolol  tartrate, ondansetron  (ZOFRAN ) IV, oxyCODONE , sodium chloride  flush, traMADol   Assessment: 58 yo male s/p elective CABG with aortic valve replacement with On-X mechanical. Warfarin per pharmacy started POD1.  INR slow to trend up 1.4 after 2 doses warfarin 2.5mg   CBC ok.  Goal of Therapy:  INR 2-3 (eventually 1.5-2)   Plan:  Warfarin 5mg  PO x 1 per MD today Daily PT/INR   Olam Chalk Pharm.D. CPP, BCPS Clinical Pharmacist [phone] 08/28/2024 3:10 PM   [facility] for all [facility] Pharmacy numbers 08/28/2024        [1] No Known  Allergies

## 2024-08-28 NOTE — Evaluation (Signed)
 Occupational Therapy Evaluation Patient Details Name: Chris Skinner MRN: [id number] DOB: 1966/09/11 Today's Date: 08/28/2024   History of Present Illness   Pt is a 58 y.o. male who presented 08/25/24 for AVR. PMH: CAD, DM, heart murmur, HLD, HTN, sleep apnea, tobacco use disorder     Clinical Impressions Pt admitted based on above, and was seen based on problem list below. PTA pt was independent with ADLs and IADLs. Today pt is requiring s for safety with use of compensatory strategies for ADLs. Bed mobility was min assist and functional transfers are  s for safety with RW. Reviewed compensatory strategies for ADLs, pt able to return demo. Verbally educated pt on energy conservation strategies for ADL. Anticipate pt will progress well, no follow up OT needs. OT will continue to follow acutely to maximize functional independence.     If plan is discharge home, recommend the following:   Assist for transportation;Assistance with cooking/housework     Functional Status Assessment   Patient has had a recent decline in their functional status and demonstrates the ability to make significant improvements in function in a reasonable and predictable amount of time.     Equipment Recommendations   Other (comment) (rollator)      Precautions/Restrictions   Precautions Precautions: Fall;Sternal;Other (comment) Precaution Booklet Issued: No Recall of Precautions/Restrictions: Intact Precaution/Restrictions Comments: chest tube Restrictions Weight Bearing Restrictions Per Provider Order: No Other Position/Activity Restrictions: sternal precautions     Mobility Bed Mobility Overal bed mobility: Needs Assistance Bed Mobility: Rolling, Sidelying to Sit Rolling: Contact guard assist Sidelying to sit: Min assist       General bed mobility comments: Min assist for trunk    Transfers Overall transfer level: Needs assistance Equipment used: Rolling walker (2  wheels) Transfers: Sit to/from Stand Sit to Stand: Supervision           General transfer comment: S for safety, good use of rocking for momentum      Balance Overall balance assessment: Needs assistance Sitting-balance support: No upper extremity supported, Feet supported Sitting balance-Leahy Scale: Fair     Standing balance support: Bilateral upper extremity supported, During functional activity, Reliant on assistive device for balance, No upper extremity supported Standing balance-Leahy Scale: Fair Standing balance comment: benefits from RW           ADL either performed or assessed with clinical judgement   ADL Overall ADL's : Needs assistance/impaired Eating/Feeding: Set up;Sitting   Grooming: Set up;Sitting           Upper Body Dressing : Supervision/safety;Sitting   Lower Body Dressing: Supervision/safety;Sit to/from stand   Toilet Transfer: Supervision/safety;Ambulation;Rolling walker (2 wheels)   Toileting- Clothing Manipulation and Hygiene: Supervision/safety;Sit to/from stand       Functional mobility during ADLs: Supervision/safety;Rolling walker (2 wheels) General ADL Comments: S for safety, reviewed compensatory strategies     Vision Baseline Vision/History: 0 No visual deficits Vision Assessment?: No apparent visual deficits            Pertinent Vitals/Pain Pain Assessment Pain Assessment: Faces Faces Pain Scale: Hurts a little bit Pain Location: sternum Pain Descriptors / Indicators: Discomfort, Grimacing, Operative site guarding Pain Intervention(s): Monitored during session     Extremity/Trunk Assessment Upper Extremity Assessment Upper Extremity Assessment: Overall WFL for tasks assessed   Lower Extremity Assessment Lower Extremity Assessment: Defer to PT evaluation       Communication Communication Communication: No apparent difficulties   Cognition Arousal: Alert Behavior During Therapy: [facility] for tasks  assessed/performed Cognition: No apparent impairments       Following commands: Intact       Cueing  General Comments   Cueing Techniques: Verbal cues  Pleth unreliable with activity, pt appearing well on RA. Denies any symptoms           Home Living Family/patient expects to be discharged to:: Private residence Living Arrangements: Parent Available Help at Discharge: Friend(s);Family;Available 24 hours/day Type of Home: House Home Access: Ramped entrance     Home Layout: Two level;Bed/bath upstairs;Able to live on main level with bedroom/bathroom;Full bath on main level Alternate Level Stairs-Number of Steps: flight   Bathroom Shower/Tub: Walk-in shower;Tub/shower unit   Bathroom Toilet: Handicapped height     Home Equipment: Rollator (4 wheels);BSC/3in1;Shower seat;Grab bars - toilet;Grab bars - tub/shower;Wheelchair - manual;Cane - single point;Crutches          Prior Functioning/Environment Prior Level of Function : Independent/Modified Independent;Driving;Working/employed     Mobility Comments: No AD ADLs Comments: Ind, Works as a Advertising Account Planner    OT Problem List: Cardiopulmonary status limiting activity;Decreased activity tolerance   OT Treatment/Interventions: Self-care/ADL training;Therapeutic exercise;DME and/or AE instruction;Therapeutic activities;Patient/family education;Balance training      OT Goals(Current goals can be found in the care plan section)   Acute Rehab OT Goals Patient Stated Goal: to get better OT Goal Formulation: With patient Time For Goal Achievement: 09/11/24 Potential to Achieve Goals: Good   OT Frequency:  Min 2X/week       AM-PAC OT 6 Clicks Daily Activity     Outcome Measure Help from another person eating meals?: None Help from another person taking care of personal grooming?: A Little Help from another person toileting, which includes using toliet, bedpan, or urinal?: A Little Help from another  person bathing (including washing, rinsing, drying)?: A Little Help from another person to put on and taking off regular upper body clothing?: A Little Help from another person to put on and taking off regular lower body clothing?: A Little 6 Click Score: 19   End of Session Equipment Utilized During Treatment: Rolling walker (2 wheels) Nurse Communication: Mobility status  Activity Tolerance: Patient tolerated treatment well Patient left: in chair;with call bell/phone within reach;with family/visitor present  OT Visit Diagnosis: Other (comment) (Decreased activity tolerance)                Time: 1032-1101 OT Time Calculation (min): 29 min Charges:  OT General Charges $OT Visit: 1 Visit OT Evaluation $OT Eval Moderate Complexity: 1 Mod OT Treatments $Self Care/Home Management : 8-22 mins  Adrianne BROCKS, OT  Acute Rehabilitation Services Office [phone] Secure chat preferred   Adrianne GORMAN Savers 08/28/2024, 1:58 PM

## 2024-08-28 NOTE — Progress Notes (Signed)
° °  NAME:  Chris Skinner, MRN:  [id number], DOB:  Jul 24, 1966, LOS: 3 ADMISSION DATE:  08/25/2024 CONSULTATION DATE:  08/25/2024 REFERRING MD:  Dr. Kacey Dysert CHIEF COMPLAINT:  CAD   History of Present Illness:  Seen in consultation at the request of Dr. Rayvn Rickerson (TCTS) for management of mechanical ventilation and hemodynamics post-CABG.  58 year old male who presented to [facility] 08/25/2024 for scheduled CABG. PMHx significant for severe aortic stenosis and coronary artery disease, OSA on home CPAP, with active tobacco use, Hyperlipidemia, Type 2 DM. Per CTS documentation He has a bicuspid aortic valve with mild AI and severe AS. 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 His main symptom is fatigue. His heart function is preserved. Planned CABG, no good targets to complete, underwent aortic valve replacement with OnX mechanical. Intra-op given DDAVP  and TXA for oozing. EBI 667.   On arrival to ICU patient on 2 mcg/min levophed . Has received albumin  x 2. CO 5.5, CI 2.5. Precedex  on 0.7. Given 2 mg versed . Pacerwires in place. On DDD at 63. Underlying sinus bradycardia.     Pertinent Medical History:  Severe aortic stenosis and coronary artery disease, OSA on home CPAP, with active tobacco use, Hyperlipidemia, Type 2 DM.  Significant Hospital Events: Including procedures, antibiotic start and stop dates in addition to other pertinent events   12/15 AVR 12/16 extubated, on norepi 12/17 off norepi, amio for atrial fibrillation, liberalizing from lines   Interim History / Subjective:  No events. To have BM today. Pain under control. Starting to diurese Remains in afib  Objective:  Blood pressure (!) 137/93, pulse 96, temperature 98.7 F (37.1 C), temperature source Oral, resp. rate (!) 25, height 6' 1.5 (1.867 m), weight 105.3 kg, SpO2 94%.        Intake/Output Summary (Last 24 hours) at 08/28/2024 9062 Last data filed at 08/28/2024 0900 Gross per 24 hour  Intake 520.29 ml  Output 2830 ml   Net -2309.71 ml   Filed Weights   08/26/24 0500 08/27/24 0600 08/28/24 0626  Weight: 106.8 kg 105.8 kg 105.3 kg   No distress Incision sites look good Serosanguinous mediastinal drain output Minimal edema Aox3, moving everything Lungs clear Good IS  K/Phos being repleted   Resolved Hospital Problem List:   Postoperative vent management  Assessment & Plan:   Severe Aortic Stenosis s/p AVR CAD Hyperlipidemia  Postop atrial fibrillation Hx OSA with CPAP at HS Active Tobacco Use  Type 2 DM  - Basal bolus insulin  - Amio to PO - Continuing mediastinal drain for now - Bowel regimen being strengthened - GDMT per TCTS - Continue mobility efforts - Diuresis goal net -1.5L today - To 4E when bed available  Rolan Sharps MD PCCM

## 2024-08-28 NOTE — Telephone Encounter (Signed)
 Leave of Absence form faxed to [employer] and scanned into chart.   Billing notified.

## 2024-08-29 ENCOUNTER — Inpatient Hospital Stay (HOSPITAL_COMMUNITY)

## 2024-08-29 ENCOUNTER — Other Ambulatory Visit: Payer: Self-pay

## 2024-08-29 DIAGNOSIS — I350 Nonrheumatic aortic (valve) stenosis: Secondary | ICD-10-CM

## 2024-08-29 LAB — PROTIME-INR
INR: 1.7 — ABNORMAL HIGH (ref 0.8–1.2)
INR: 1.8 — ABNORMAL HIGH (ref 0.8–1.2)
Prothrombin Time: 21 s — ABNORMAL HIGH (ref 11.4–15.2)
Prothrombin Time: 21.8 s — ABNORMAL HIGH (ref 11.4–15.2)

## 2024-08-29 LAB — CBC
HCT: 30 % — ABNORMAL LOW (ref 39.0–52.0)
Hemoglobin: 10.9 g/dL — ABNORMAL LOW (ref 13.0–17.0)
MCH: 32.4 pg (ref 26.0–34.0)
MCHC: 36.3 g/dL — ABNORMAL HIGH (ref 30.0–36.0)
MCV: 89.3 fL (ref 80.0–100.0)
Platelets: 255 K/uL (ref 150–400)
RBC: 3.36 MIL/uL — ABNORMAL LOW (ref 4.22–5.81)
RDW: 11.6 % (ref 11.5–15.5)
WBC: 11.4 K/uL — ABNORMAL HIGH (ref 4.0–10.5)
nRBC: 0 % (ref 0.0–0.2)

## 2024-08-29 LAB — GLUCOSE, CAPILLARY
Glucose-Capillary: 123 mg/dL — ABNORMAL HIGH (ref 70–99)
Glucose-Capillary: 178 mg/dL — ABNORMAL HIGH (ref 70–99)
Glucose-Capillary: 95 mg/dL (ref 70–99)
Glucose-Capillary: 113 mg/dL — ABNORMAL HIGH (ref 70–99)

## 2024-08-29 LAB — BASIC METABOLIC PANEL WITH GFR
Anion gap: 10 (ref 5–15)
BUN: 19 mg/dL (ref 6–20)
CO2: 25 mmol/L (ref 22–32)
Calcium: 8.6 mg/dL — ABNORMAL LOW (ref 8.9–10.3)
Chloride: 101 mmol/L (ref 98–111)
Creatinine, Ser: 0.63 mg/dL (ref 0.61–1.24)
GFR, Estimated: >60 mL/min
Glucose, Bld: 111 mg/dL — ABNORMAL HIGH (ref 70–99)
Potassium: 3.4 mmol/L — ABNORMAL LOW (ref 3.5–5.1)
Sodium: 136 mmol/L (ref 135–145)

## 2024-08-29 LAB — MAGNESIUM: Magnesium: 2 mg/dL (ref 1.7–2.4)

## 2024-08-29 MED ORDER — WARFARIN SODIUM 5 MG PO TABS
5.0000 mg | ORAL_TABLET | Freq: Once | ORAL | Status: AC
  Administered 2024-08-29: 5 mg via ORAL
  Filled 2024-08-29: qty 1

## 2024-08-29 MED ORDER — ASPIRIN 81 MG PO CHEW: 81.0000 mg | CHEWABLE_TABLET | Freq: Every day | ORAL | Status: DC

## 2024-08-29 MED ORDER — ASPIRIN 81 MG PO TBEC
81.0000 mg | DELAYED_RELEASE_TABLET | Freq: Every day | ORAL | Status: DC
  Administered 2024-08-29 – 2024-08-30 (×2): 81 mg via ORAL
  Filled 2024-08-29 (×2): qty 1

## 2024-08-29 MED ORDER — FUROSEMIDE 10 MG/ML IJ SOLN
60.0000 mg | Freq: Once | INTRAMUSCULAR | Status: AC
  Administered 2024-08-29: 60 mg via INTRAVENOUS
  Filled 2024-08-29: qty 6

## 2024-08-29 MED ORDER — POTASSIUM CHLORIDE CRYS ER 20 MEQ PO TBCR
40.0000 meq | EXTENDED_RELEASE_TABLET | Freq: Two times a day (BID) | ORAL | Status: AC
  Administered 2024-08-29 (×2): 40 meq via ORAL
  Filled 2024-08-29 (×2): qty 2

## 2024-08-29 NOTE — Discharge Instructions (Addendum)
 Information on my medicine - Coumadin    (Warfarin)  This medication education was reviewed with me or my healthcare representative as part of my discharge preparation.    Why was Coumadin  prescribed for you? Coumadin  was prescribed for you because you have a blood clot or a medical condition that can cause an increased risk of forming blood clots. Blood clots can cause serious health problems by blocking the flow of blood to the heart, lung, or brain. Coumadin  can prevent harmful blood clots from forming. As a reminder your indication for Coumadin  is:  Blood Clot Prevention after Heart Valve Surgery  What test will check on my response to Coumadin ? While on Coumadin  (warfarin) you will need to have an INR test regularly to ensure that your dose is keeping you in the desired range. The INR (international normalized ratio) number is calculated from the result of the laboratory test called prothrombin time (PT).  If an INR APPOINTMENT HAS NOT ALREADY BEEN MADE FOR YOU please schedule an appointment to have this lab work done by your health care provider within 7 days. Your INR goal is usually a number between:  2 to 3 or your provider may give you a more narrow range like 2-2.5.  Ask your health care provider during an office visit what your goal INR is.  What  do you need to  know  About  COUMADIN ? Take Coumadin  (warfarin) exactly as prescribed by your healthcare provider about the same time each day.  DO NOT stop taking without talking to the doctor who prescribed the medication.  Stopping without other blood clot prevention medication to take the place of Coumadin  may increase your risk of developing a new clot or stroke.  Get refills before you run out.  What do you do if you miss a dose? If you miss a dose, take it as soon as you remember on the same day then continue your regularly scheduled regimen the next day.  Do not take two doses of Coumadin  at the same time.  Important Safety  Information A possible side effect of Coumadin  (Warfarin) is an increased risk of bleeding. You should call your healthcare provider right away if you experience any of the following: Bleeding from an injury or your nose that does not stop. Unusual colored urine (red or dark brown) or unusual colored stools (red or black). Unusual bruising for unknown reasons. A serious fall or if you hit your head (even if there is no bleeding).  Some foods or medicines interact with Coumadin  (warfarin) and might alter your response to warfarin. To help avoid this: Eat a balanced diet, maintaining a consistent amount of Vitamin K. Notify your provider about major diet changes you plan to make. Avoid alcohol or limit your intake to 1 drink for women and 2 drinks for men per day. (1 drink is 5 oz. wine, 12 oz. beer, or 1.5 oz. liquor.)  Make sure that ANY health care provider who prescribes medication for you knows that you are taking Coumadin  (warfarin).  Also make sure the healthcare provider who is monitoring your Coumadin  knows when you have started a new medication including herbals and non-prescription products.  Coumadin  (Warfarin)  Major Drug Interactions  Increased Warfarin Effect Decreased Warfarin Effect  Alcohol (large quantities) Antibiotics (esp. Septra/Bactrim, Flagyl, Cipro ) Amiodarone  (Cordarone ) Aspirin  (ASA) Cimetidine (Tagamet) Megestrol (Megace) NSAIDs (ibuprofen, naproxen, etc.) Piroxicam (Feldene) Propafenone (Rythmol SR) Propranolol (Inderal) Isoniazid (INH) Posaconazole (Noxafil) Barbiturates (Phenobarbital) Carbamazepine (Tegretol) Chlordiazepoxide (Librium) Cholestyramine (Questran) Griseofulvin  Oral Contraceptives Rifampin Sucralfate (Carafate) Vitamin K   Coumadin  (Warfarin) Major Herbal Interactions  Increased Warfarin Effect Decreased Warfarin Effect  Garlic Ginseng Ginkgo biloba Coenzyme Q10 Green tea St. Johns wort    Coumadin  (Warfarin) FOOD  Interactions  Eat a consistent number of servings per week of foods HIGH in Vitamin K (1 serving =  cup)  Collards (cooked, or boiled & drained) Kale (cooked, or boiled & drained) Mustard greens (cooked, or boiled & drained) Parsley *serving size only =  cup Spinach (cooked, or boiled & drained) Swiss chard (cooked, or boiled & drained) Turnip greens (cooked, or boiled & drained)  Eat a consistent number of servings per week of foods MEDIUM-HIGH in Vitamin K (1 serving = 1 cup)  Asparagus (cooked, or boiled & drained) Broccoli (cooked, boiled & drained, or raw & chopped) Brussel sprouts (cooked, or boiled & drained) *serving size only =  cup Lettuce, raw (green leaf, endive, romaine) Spinach, raw Turnip greens, raw & chopped   These websites have more information on Coumadin  (warfarin):  www.coumadin .com; www.ahrq.gov/consumer/coumadin .htm;  Discharge Instructions:  1. You may shower, please wash incisions daily with soap and water  and keep dry.  If you wish to cover wounds with dressing you may do so but please keep clean and change daily.  No tub baths or swimming until incisions have completely healed.  If your incisions become red or develop any drainage please call our office at [phone]  2. No Driving until cleared by Dr. Linward office and you are no longer using narcotic pain medications  3. Monitor your weight daily.. Please use the same scale and weigh at same time... If you gain 5-10 lbs in 48 hours with associated lower extremity swelling, please contact our office at [phone]  4. Fever of 101.5 for at least 24 hours with no source, please contact our office at [phone]  5. Activity- up as tolerated, please walk at least 3 times per day.  Avoid strenuous activity, no lifting, pushing, or pulling with your arms over 8-10 lbs for a minimum of 6 weeks  6. If any questions or concerns arise, please do not hesitate to contact our office at [phone]

## 2024-08-29 NOTE — Progress Notes (Signed)
 Physical Therapy Treatment Patient Details Name: Chris Skinner MRN: [id number] DOB: July 05, 1966 Today's Date: 08/29/2024   History of Present Illness Pt is a 58 y.o. male who presented 08/25/24 for AVR. PMH: CAD, DM, heart murmur, HLD, HTN, sleep apnea, tobacco use disorder    PT Comments  Pt resting in bed on arrival, pleasant and agreeable to session and demonstrating steady progress towards acute goals, pr demonstrating transfers and gait with RW for support at grossly supervision level. Pt continues to require light min A to elevate trunk from side-lying to sit during bed mobility. Pt with good adherence and recall for all precautions throughout session. Pt declining stair training this session stating he did not feel ready despite encouragement. Continued education on importance of frequent mobilization, appropriate activity progression, RW use, and all precautions with pt verbalizing understanding. Pt continues to benefit from skilled PT services to progress toward functional mobility goals.     If plan is discharge home, recommend the following: A little help with walking and/or transfers;A little help with bathing/dressing/bathroom;Assistance with cooking/housework;Assist for transportation;Help with stairs or ramp for entrance   Can travel by private vehicle        Equipment Recommendations  None recommended by PT    Recommendations for Other Services       Precautions / Restrictions Precautions Precautions: Fall;Sternal;Other (comment) Precaution Booklet Issued: No Recall of Precautions/Restrictions: Intact Precaution/Restrictions Comments: chest tube Restrictions Weight Bearing Restrictions Per Provider Order: No Other Position/Activity Restrictions: sternal precautions     Mobility  Bed Mobility Overal bed mobility: Needs Assistance Bed Mobility: Rolling, Sidelying to Sit, Sit to Sidelying Rolling: Contact guard assist Sidelying to sit: Min assist     Sit to  sidelying: Contact guard assist General bed mobility comments: light min A to elevate trunk to sitting, cues for sequencing throughout    Transfers Overall transfer level: Needs assistance Equipment used: Rolling walker (2 wheels) Transfers: Sit to/from Stand Sit to Stand: Supervision           General transfer comment: S for safety, good use of rocking for momentum with hands crossed at chest on rise and when coming to sit    Ambulation/Gait Ambulation/Gait assistance: Supervision Gait Distance (Feet): 300 Feet Assistive device: Rolling walker (2 wheels) Gait Pattern/deviations: Step-through pattern, Decreased stride length, Antalgic Gait velocity: reduced     General Gait Details: Pt ambulating slow and guarded, no LOB, supervsion for safety   Stairs Stairs:  (pt declining stair training this session stating not yet despite encouragement)           Wheelchair Mobility     Tilt Bed    Modified Rankin (Stroke Patients Only)       Balance Overall balance assessment: Needs assistance Sitting-balance support: No upper extremity supported, Feet supported Sitting balance-Leahy Scale: Fair Sitting balance - Comments: static sitting EOB with supervision for safety   Standing balance support: Bilateral upper extremity supported, During functional activity, Reliant on assistive device for balance, No upper extremity supported Standing balance-Leahy Scale: Fair Standing balance comment: benefits from RW, able to maintain static standing without UE support                            Communication Communication Communication: No apparent difficulties  Cognition Arousal: Alert Behavior During Therapy: WFL for tasks assessed/performed   PT - Cognitive impairments: No apparent impairments  PT - Cognition Comments: Recalls sternal precautions and complies to them well Following commands: Intact      Cueing Cueing  Techniques: Verbal cues  Exercises      General Comments General comments (skin integrity, edema, etc.): VSS on RA      Pertinent Vitals/Pain Pain Assessment Pain Assessment: Faces Faces Pain Scale: Hurts a little bit Pain Location: sternum Pain Descriptors / Indicators: Discomfort, Grimacing, Operative site guarding Pain Intervention(s): Monitored during session    Home Living                          Prior Function            PT Goals (current goals can now be found in the care plan section) Acute Rehab PT Goals Patient Stated Goal: to improve PT Goal Formulation: With patient/family Time For Goal Achievement: 09/10/24 Progress towards PT goals: Progressing toward goals    Frequency    Min 2X/week      PT Plan      Co-evaluation              AM-PAC PT 6 Clicks Mobility   Outcome Measure  Help needed turning from your back to your side while in a flat bed without using bedrails?: A Little Help needed moving from lying on your back to sitting on the side of a flat bed without using bedrails?: A Little Help needed moving to and from a bed to a chair (including a wheelchair)?: A Little Help needed standing up from a chair using your arms (e.g., wheelchair or bedside chair)?: A Little Help needed to walk in hospital room?: A Little Help needed climbing 3-5 steps with a railing? : A Little 6 Click Score: 18    End of Session   Activity Tolerance: Patient tolerated treatment well Patient left: in bed;with call bell/phone within reach;with family/visitor present Nurse Communication: Mobility status PT Visit Diagnosis: Unsteadiness on feet (R26.81);Other abnormalities of gait and mobility (R26.89);Difficulty in walking, not elsewhere classified (R26.2);Pain Pain - Right/Left:  (sternum) Pain - part of body:  (sternum)     Time: 9040-8982 PT Time Calculation (min) (ACUTE ONLY): 18 min  Charges:    $Gait Training: 8-22 mins PT General  Charges $$ ACUTE PT VISIT: 1 Visit                     Jennings Corado R. PTA Acute Rehabilitation Services Office: [phone]   Chris Skinner 08/29/2024, 10:18 AM

## 2024-08-29 NOTE — Progress Notes (Signed)
 PHARMACY - ANTICOAGULATION CONSULT NOTE  Pharmacy Consult for Warfarin Indication: on-X valve  Allergies[1]  Patient Measurements: Height: 6' 1.5 (186.7 cm) Weight: 102.2 kg (225 lb 3.2 oz) IBW/kg (Calculated) : 81.05 HEPARIN  DW (KG): 99.8  Vital Signs: Temp: 99 F (37.2 C) (12/19 0811) Temp Source: Oral (12/19 0811) BP: 133/77 (12/19 0811) Pulse Rate: 82 (12/19 0811)  Labs: Recent Labs    08/27/24 0544 08/28/24 0322 08/29/24 0339  HGB 10.7* 10.2* 10.9*  HCT 30.3* 27.7* 30.0*  PLT 203 177 255  LABPROT 17.3* 17.7* 21.0*  INR 1.3* 1.4* 1.7*  CREATININE 0.65 0.63 0.63    Estimated Creatinine Clearance: 124.3 mL/min (by C-G formula based on SCr of 0.63 mg/dL).   Medical History: Past Medical History:  Diagnosis Date   Chest pain 2001   [place], normal cardiac workup   Coronary artery disease    Diabetes mellitus without complication (HCC)    Dyslipidemia    H/O echocardiogram 09/2013   PFO found incidentally.  Echo done due to murmur   Heart murmur    mild aortic stenosis   Hyperlipidemia    Hypertension    Personal history of colonic polyp - adenoma 01/05/2014   Sleep apnea    cpap   Tobacco use disorder    Wears glasses     Medications:  Scheduled:   acetaminophen   650 mg Oral Q6H   amiodarone   400 mg Oral BID   aspirin  EC  81 mg Oral Daily   Or   aspirin   81 mg Per Tube Daily   bisacodyl   10 mg Oral Daily   Or   bisacodyl   10 mg Rectal Daily   Chlorhexidine  Gluconate Cloth  6 each Topical Daily   docusate sodium   200 mg Oral Daily   ezetimibe   10 mg Oral Daily   furosemide   60 mg Intravenous Once   insulin  aspart  0-20 Units Subcutaneous TID WC   insulin  aspart  0-5 Units Subcutaneous QHS   insulin  glargine  18 Units Subcutaneous Daily   lidocaine   1 patch Transdermal Q24H   metoprolol  tartrate  12.5 mg Oral BID   Or   metoprolol  tartrate  12.5 mg Per Tube BID   nicotine   14 mg Transdermal Daily   pantoprazole   40 mg Oral Daily    potassium chloride   40 mEq Oral BID   rosuvastatin   40 mg Oral Daily   sodium chloride  flush  3 mL Intravenous Q12H   sodium chloride  flush  3 mL Intravenous Q12H   Warfarin - Pharmacist Dosing Inpatient   Does not apply q1600   Infusions:   sodium chloride      norepinephrine  (LEVOPHED ) Adult infusion Stopped (08/27/24 0708)   PRN: sodium chloride , dextrose , metoprolol  tartrate, ondansetron  (ZOFRAN ) IV, oxyCODONE , sodium chloride  flush, sodium chloride  flush, traMADol   Assessment: 58 yo male s/p elective CABG with aortic valve replacement with On-X mechanical. Warfarin per pharmacy started POD1.  INR trending up 1.7 after warfarin 2.5mg , 2.5mg , 5mg   CBC ok.  Goal of Therapy:  INR 2-3 (eventually 1.5-2)   Plan:  Warfarin 5mg  PO x 1  Daily PT/INR   [employer], Pharm.D., BCPS Clinical Pharmacist Clinical phone for 08/29/2024 from 7:30-3:00 is [phone].  **Pharmacist phone directory can be found on [email or web address] listed under [facility].  08/29/2024 8:30 AM     [1] No Known Allergies

## 2024-08-29 NOTE — Progress Notes (Signed)
" ° °  [address] [place] [address]             [phone]  4 Days Post-Op Procedures (LRB): REPLACEMENT, AORTIC VALVE, OPEN USING On-X PROSTHETIC HEART VALVE (N/A) ENDOSCOPICALLY HARVESTED RIGHT GREATER SAPHENOUS VEIN (N/A) ECHOCARDIOGRAM, TRANSESOPHAGEAL, INTRAOPERATIVE (N/A) Subjective: Transferred to 4E last evening.  Feels he is progressing. Walked in the hall, had BM x 2 yesterday.   Objective: Vital signs in last 24 hours: Temp:  [98 F (36.7 C)-98.6 F (37 C)] 98.5 F (36.9 C) (12/19 0315) Pulse Rate:  [65-103] 80 (12/19 0315) Cardiac Rhythm: Atrial fibrillation;Bundle branch block (12/18 1900) Resp:  [15-31] 19 (12/19 0315) BP: (108-138)/(66-90) 131/88 (12/19 0315) SpO2:  [88 %-98 %] 96 % (12/19 0315) FiO2 (%):  [28 %] 28 % (12/19 0000) Weight:  [102.2 kg] 102.2 kg (12/19 0418)    Intake/Output from previous day: 12/18 0701 - 12/19 0700 In: 37.6 [I.V.:37.6] Out: 1830 [Urine:1650; Chest Tube:180] Intake/Output this shift: No intake/output data recorded.  General appearance: alert, cooperative, and no distress Neurologic: intact Heart: remains in a-fib with controlled rate Lungs: normal resp effort,(using C-PAP), breath sounds are clear. CXR with small right effusion / ATX.   Abdomen: soft, no tenderness Extremities: RLE EVH incision is intact, expected bruising right thigh.  Wound: the sternotomy incision is open to air, intact and dry.   Lab Results: Recent Labs    08/28/24 0322 08/29/24 0339  WBC 12.1* 11.4*  HGB 10.2* 10.9*  HCT 27.7* 30.0*  PLT 177 255   BMET:  Recent Labs    08/28/24 0322 08/29/24 0339  NA 134* 136  K 3.4* 3.4*  CL 98 101  CO2 27 25  GLUCOSE 124* 111*  BUN 24* 19  CREATININE 0.63 0.63  CALCIUM  8.5* 8.6*    PT/INR:  Recent Labs    08/29/24 0339  LABPROT 21.0*  INR 1.7*   ABG    Component Value Date/Time   PHART 7.351 08/25/2024 1650   HCO3 20.0 08/25/2024 1650   TCO2 21 (L)  08/25/2024 1650   ACIDBASEDEF 5.0 (H) 08/25/2024 1650   O2SAT 93 08/25/2024 1650   CBG (last 3)  Recent Labs    08/28/24 1600 08/28/24 2107 08/29/24 0601  GLUCAP 153* 134* 123*    Assessment/Plan: S/P Procedures (LRB): REPLACEMENT, AORTIC VALVE, OPEN USING On-X PROSTHETIC HEART VALVE (N/A) ENDOSCOPICALLY HARVESTED RIGHT GREATER SAPHENOUS VEIN (N/A) ECHOCARDIOGRAM, TRANSESOPHAGEAL, INTRAOPERATIVE (N/A)  -POD4 mechanical AVR for severe aortic stenosis. VS stable. INR 1.7 after Coumadin  dosed at 2.5mg -2.5mg -5mg . Progressing well with mobility. CT drainage 70ml past 12 hours. Plan to remove today.   - Post-op atrial fibrillation- controlled rate. On metoprolol  12.5mg  BID and amiodarone  400mg  PO BID. K+3.4, Mg++ 2.0. Supplement K+ again today. He is being anticoagulated.   -GI- tolerating PO's, BM yesterday.  -RENAL- normal function. Wt down 3kg from yesterday but still 2 1/2 kg +.  Continue Lasix .   -PULM- on RA during the day, C-PAP at night. Has some basilar ATX / small effusion on the right on CXR this morning. Diurese, pulm hygiene.  -ENDO- CBGs controlled. Continue Lantus  and SSI. On Farxiga  prior to admission.   -Disposotion- anticipate discharge to home in 1-2 days.    LOS: 4 days    Brylan Seubert G. Nara Paternoster, PA-C 08/29/2024   "

## 2024-08-29 NOTE — Progress Notes (Signed)
 Patient walked 470 ft

## 2024-08-30 ENCOUNTER — Other Ambulatory Visit (HOSPITAL_COMMUNITY): Payer: Self-pay

## 2024-08-30 LAB — CBC
HCT: 27.7 % — ABNORMAL LOW (ref 39.0–52.0)
Hemoglobin: 10.1 g/dL — ABNORMAL LOW (ref 13.0–17.0)
MCH: 32.3 pg (ref 26.0–34.0)
MCHC: 36.5 g/dL — ABNORMAL HIGH (ref 30.0–36.0)
MCV: 88.5 fL (ref 80.0–100.0)
Platelets: 330 K/uL (ref 150–400)
RBC: 3.13 MIL/uL — ABNORMAL LOW (ref 4.22–5.81)
RDW: 11.6 % (ref 11.5–15.5)
WBC: 9.3 K/uL (ref 4.0–10.5)
nRBC: 0 % (ref 0.0–0.2)

## 2024-08-30 LAB — BASIC METABOLIC PANEL WITH GFR
Anion gap: 10 (ref 5–15)
BUN: 20 mg/dL (ref 6–20)
CO2: 24 mmol/L (ref 22–32)
Calcium: 8.5 mg/dL — ABNORMAL LOW (ref 8.9–10.3)
Chloride: 104 mmol/L (ref 98–111)
Creatinine, Ser: 0.66 mg/dL (ref 0.61–1.24)
GFR, Estimated: >60 mL/min
Glucose, Bld: 115 mg/dL — ABNORMAL HIGH (ref 70–99)
Potassium: 3.5 mmol/L (ref 3.5–5.1)
Sodium: 138 mmol/L (ref 135–145)

## 2024-08-30 LAB — PROTIME-INR
INR: 2.2 — ABNORMAL HIGH (ref 0.8–1.2)
Prothrombin Time: 25.3 s — ABNORMAL HIGH (ref 11.4–15.2)

## 2024-08-30 LAB — MAGNESIUM: Magnesium: 1.9 mg/dL (ref 1.7–2.4)

## 2024-08-30 LAB — GLUCOSE, CAPILLARY: Glucose-Capillary: 105 mg/dL — ABNORMAL HIGH (ref 70–99)

## 2024-08-30 MED ORDER — ACETAMINOPHEN 325 MG PO TABS: 650.0000 mg | ORAL_TABLET | Freq: Four times a day (QID) | ORAL | Status: AC

## 2024-08-30 MED ORDER — AMIODARONE HCL 200 MG PO TABS: ORAL_TABLET | ORAL | 1 refills | Status: DC

## 2024-08-30 MED ORDER — FUROSEMIDE 40 MG PO TABS
40.0000 mg | ORAL_TABLET | Freq: Every day | ORAL | Status: DC
  Administered 2024-08-30: 40 mg via ORAL
  Filled 2024-08-30: qty 1

## 2024-08-30 MED ORDER — WARFARIN SODIUM 4 MG PO TABS
4.0000 mg | ORAL_TABLET | Freq: Every day | ORAL | 0 refills | Status: DC
  Filled 2024-08-30 (×2): qty 5, 5d supply, fill #0

## 2024-08-30 MED ORDER — LIDOCAINE 5 % EX PTCH: 1.0000 | MEDICATED_PATCH | CUTANEOUS | 0 refills | Status: DC

## 2024-08-30 MED ORDER — WARFARIN SODIUM 4 MG PO TABS
4.0000 mg | ORAL_TABLET | Freq: Every day | ORAL | 1 refills | Status: DC
  Filled 2024-08-30: qty 30, 30d supply, fill #0

## 2024-08-30 MED ORDER — WARFARIN SODIUM 2 MG PO TABS
4.0000 mg | ORAL_TABLET | Freq: Once | ORAL | Status: AC
  Administered 2024-08-30: 4 mg via ORAL
  Filled 2024-08-30: qty 2

## 2024-08-30 MED ORDER — OXYCODONE HCL 10 MG PO TABS: 10.0000 mg | ORAL_TABLET | ORAL | 0 refills | Status: DC | PRN

## 2024-08-30 MED ORDER — POTASSIUM CHLORIDE CRYS ER 20 MEQ PO TBCR: 20.0000 meq | EXTENDED_RELEASE_TABLET | Freq: Every day | ORAL | Status: DC

## 2024-08-30 MED ORDER — WARFARIN SODIUM 4 MG PO TABS
4.0000 mg | ORAL_TABLET | Freq: Once | ORAL | 0 refills | Status: DC
  Filled 2024-08-30: qty 1, 1d supply, fill #0

## 2024-08-30 MED ORDER — NICOTINE 14 MG/24HR TD PT24: 14.0000 mg | MEDICATED_PATCH | Freq: Every day | TRANSDERMAL | 1 refills | Status: AC

## 2024-08-30 MED ORDER — FUROSEMIDE 40 MG PO TABS: 40.0000 mg | ORAL_TABLET | Freq: Every day | ORAL | 0 refills | Status: DC

## 2024-08-30 MED ORDER — POTASSIUM CHLORIDE CRYS ER 20 MEQ PO TBCR: 20.0000 meq | EXTENDED_RELEASE_TABLET | Freq: Every day | ORAL | 0 refills | Status: DC

## 2024-08-30 MED ORDER — METOPROLOL TARTRATE 25 MG PO TABS: 12.5000 mg | ORAL_TABLET | Freq: Two times a day (BID) | ORAL | 1 refills | Status: AC

## 2024-08-30 MED ORDER — POTASSIUM CHLORIDE CRYS ER 20 MEQ PO TBCR
40.0000 meq | EXTENDED_RELEASE_TABLET | Freq: Two times a day (BID) | ORAL | Status: DC
  Administered 2024-08-30: 40 meq via ORAL
  Filled 2024-08-30: qty 2

## 2024-08-30 NOTE — Plan of Care (Signed)
 " Problem: Education: Goal: Knowledge of General Education information will improve Description: Including pain rating scale, medication(s)/side effects and non-pharmacologic comfort measures 08/30/2024 1047 by Gary Michaelis, RN Outcome: Adequate for Discharge 08/30/2024 1046 by Gary Michaelis, RN Outcome: Progressing   Problem: Health Behavior/Discharge Planning: Goal: Ability to manage health-related needs will improve 08/30/2024 1047 by Gary Michaelis, RN Outcome: Adequate for Discharge 08/30/2024 1046 by Gary Michaelis, RN Outcome: Progressing   Problem: Clinical Measurements: Goal: Ability to maintain clinical measurements within normal limits will improve 08/30/2024 1047 by Gary Michaelis, RN Outcome: Adequate for Discharge 08/30/2024 1046 by Gary Michaelis, RN Outcome: Progressing Goal: Will remain free from infection 08/30/2024 1047 by Gary Michaelis, RN Outcome: Adequate for Discharge 08/30/2024 1046 by Gary Michaelis, RN Outcome: Progressing Goal: Diagnostic test results will improve 08/30/2024 1047 by Gary Michaelis, RN Outcome: Adequate for Discharge 08/30/2024 1046 by Gary Michaelis, RN Outcome: Progressing Goal: Respiratory complications will improve 08/30/2024 1047 by Gary Michaelis, RN Outcome: Adequate for Discharge 08/30/2024 1046 by Gary Michaelis, RN Outcome: Progressing Goal: Cardiovascular complication will be avoided 08/30/2024 1047 by Gary Michaelis, RN Outcome: Adequate for Discharge 08/30/2024 1046 by Gary Michaelis, RN Outcome: Progressing   Problem: Activity: Goal: Risk for activity intolerance will decrease 08/30/2024 1047 by Gary Michaelis, RN Outcome: Adequate for Discharge 08/30/2024 1046 by Gary Michaelis, RN Outcome: Progressing   Problem: Nutrition: Goal: Adequate nutrition will be maintained 08/30/2024 1047 by Gary Michaelis, RN Outcome: Adequate for Discharge 08/30/2024 1046  by Gary Michaelis, RN Outcome: Progressing   Problem: Coping: Goal: Level of anxiety will decrease 08/30/2024 1047 by Gary Michaelis, RN Outcome: Adequate for Discharge 08/30/2024 1046 by Gary Michaelis, RN Outcome: Progressing   Problem: Elimination: Goal: Will not experience complications related to bowel motility 08/30/2024 1047 by Gary Michaelis, RN Outcome: Adequate for Discharge 08/30/2024 1046 by Gary Michaelis, RN Outcome: Progressing Goal: Will not experience complications related to urinary retention 08/30/2024 1047 by Gary Michaelis, RN Outcome: Adequate for Discharge 08/30/2024 1046 by Gary Michaelis, RN Outcome: Progressing   Problem: Pain Managment: Goal: General experience of comfort will improve and/or be controlled 08/30/2024 1047 by Gary Michaelis, RN Outcome: Adequate for Discharge 08/30/2024 1046 by Gary Michaelis, RN Outcome: Progressing   Problem: Safety: Goal: Ability to remain free from injury will improve 08/30/2024 1047 by Gary Michaelis, RN Outcome: Adequate for Discharge 08/30/2024 1046 by Gary Michaelis, RN Outcome: Progressing   Problem: Skin Integrity: Goal: Risk for impaired skin integrity will decrease 08/30/2024 1047 by Gary Michaelis, RN Outcome: Adequate for Discharge 08/30/2024 1046 by Gary Michaelis, RN Outcome: Progressing   Problem: Education: Goal: Will demonstrate proper wound care and an understanding of methods to prevent future damage 08/30/2024 1047 by Gary Michaelis, RN Outcome: Adequate for Discharge 08/30/2024 1046 by Gary Michaelis, RN Outcome: Progressing Goal: Knowledge of disease or condition will improve 08/30/2024 1047 by Gary Michaelis, RN Outcome: Adequate for Discharge 08/30/2024 1046 by Gary Michaelis, RN Outcome: Progressing Goal: Knowledge of the prescribed therapeutic regimen will improve 08/30/2024 1047 by Gary Michaelis, RN Outcome: Adequate for  Discharge 08/30/2024 1046 by Gary Michaelis, RN Outcome: Progressing Goal: Individualized Educational Video(s) 08/30/2024 1047 by Gary Michaelis, RN Outcome: Adequate for Discharge 08/30/2024 1046 by Gary Michaelis, RN Outcome: Progressing   Problem: Activity: Goal: Risk for activity intolerance will decrease 08/30/2024 1047 by Gary Michaelis, RN Outcome: Adequate for Discharge 08/30/2024 1046 by Gary Michaelis, RN Outcome: Progressing   Problem: Cardiac: Goal: Will achieve and/or maintain hemodynamic stability 08/30/2024 1047 by Gary Michaelis, RN Outcome:  Adequate for Discharge 08/30/2024 1046 by Gary Michaelis, RN Outcome: Progressing   Problem: Clinical Measurements: Goal: Postoperative complications will be avoided or minimized 08/30/2024 1047 by Gary Michaelis, RN Outcome: Adequate for Discharge 08/30/2024 1046 by Gary Michaelis, RN Outcome: Progressing   Problem: Respiratory: Goal: Respiratory status will improve 08/30/2024 1047 by Gary Michaelis, RN Outcome: Adequate for Discharge 08/30/2024 1046 by Gary Michaelis, RN Outcome: Progressing   Problem: Skin Integrity: Goal: Wound healing without signs and symptoms of infection 08/30/2024 1047 by Gary Michaelis, RN Outcome: Adequate for Discharge 08/30/2024 1046 by Gary Michaelis, RN Outcome: Progressing Goal: Risk for impaired skin integrity will decrease 08/30/2024 1047 by Gary Michaelis, RN Outcome: Adequate for Discharge 08/30/2024 1046 by Gary Michaelis, RN Outcome: Progressing   Problem: Urinary Elimination: Goal: Ability to achieve and maintain adequate renal perfusion and functioning will improve 08/30/2024 1047 by Gary Michaelis, RN Outcome: Adequate for Discharge 08/30/2024 1046 by Gary Michaelis, RN Outcome: Progressing   Problem: Education: Goal: Ability to describe self-care measures that may prevent or decrease complications (Diabetes  Survival Skills Education) will improve 08/30/2024 1047 by Gary Michaelis, RN Outcome: Adequate for Discharge 08/30/2024 1046 by Gary Michaelis, RN Outcome: Progressing Goal: Individualized Educational Video(s) 08/30/2024 1047 by Gary Michaelis, RN Outcome: Adequate for Discharge 08/30/2024 1046 by Gary Michaelis, RN Outcome: Progressing   Problem: Coping: Goal: Ability to adjust to condition or change in health will improve 08/30/2024 1047 by Gary Michaelis, RN Outcome: Adequate for Discharge 08/30/2024 1046 by Gary Michaelis, RN Outcome: Progressing   Problem: Fluid Volume: Goal: Ability to maintain a balanced intake and output will improve 08/30/2024 1047 by Gary Michaelis, RN Outcome: Adequate for Discharge 08/30/2024 1046 by Gary Michaelis, RN Outcome: Progressing   Problem: Health Behavior/Discharge Planning: Goal: Ability to identify and utilize available resources and services will improve 08/30/2024 1047 by Gary Michaelis, RN Outcome: Adequate for Discharge 08/30/2024 1046 by Gary Michaelis, RN Outcome: Progressing Goal: Ability to manage health-related needs will improve 08/30/2024 1047 by Gary Michaelis, RN Outcome: Adequate for Discharge 08/30/2024 1046 by Gary Michaelis, RN Outcome: Progressing   Problem: Metabolic: Goal: Ability to maintain appropriate glucose levels will improve 08/30/2024 1047 by Gary Michaelis, RN Outcome: Adequate for Discharge 08/30/2024 1046 by Gary Michaelis, RN Outcome: Progressing   Problem: Nutritional: Goal: Maintenance of adequate nutrition will improve 08/30/2024 1047 by Gary Michaelis, RN Outcome: Adequate for Discharge 08/30/2024 1046 by Gary Michaelis, RN Outcome: Progressing Goal: Progress toward achieving an optimal weight will improve 08/30/2024 1047 by Gary Michaelis, RN Outcome: Adequate for Discharge 08/30/2024 1046 by Gary Michaelis, RN Outcome:  Progressing   Problem: Skin Integrity: Goal: Risk for impaired skin integrity will decrease 08/30/2024 1047 by Gary Michaelis, RN Outcome: Adequate for Discharge 08/30/2024 1046 by Gary Michaelis, RN Outcome: Progressing   Problem: Tissue Perfusion: Goal: Adequacy of tissue perfusion will improve 08/30/2024 1047 by Gary Michaelis, RN Outcome: Adequate for Discharge 08/30/2024 1046 by Gary Michaelis, RN Outcome: Progressing   "

## 2024-08-30 NOTE — Progress Notes (Signed)
 CARDIAC REHAB PHASE I     Post TAVR education including site care, restrictions, risk factors, exercise guidelines, heart healthy diet, smoking cessation and CRP2 reviewed. All questions and concerns addressed. Will refer to [facility] for CRP2. Plan for home later today.     Fairy JONETTA Music, RN BSN 08/30/2024 11:10 AM [id number]

## 2024-08-30 NOTE — Progress Notes (Signed)
 PHARMACY - ANTICOAGULATION CONSULT NOTE  Pharmacy Consult for Warfarin Indication: on-X valve  Allergies[1]  Patient Measurements: Height: 6' 1.5 (186.7 cm) Weight: 99.9 kg (220 lb 3.8 oz) IBW/kg (Calculated) : 81.05 HEPARIN  DW (KG): 99.8  Vital Signs: Temp: 98.5 F (36.9 C) (12/20 0801) Temp Source: Oral (12/20 0801) BP: 134/84 (12/20 1109) Pulse Rate: 80 (12/20 1109)  Labs: Recent Labs    08/28/24 0322 08/29/24 0339 08/29/24 1502 08/30/24 0319  HGB 10.2* 10.9*  --  10.1*  HCT 27.7* 30.0*  --  27.7*  PLT 177 255  --  330  LABPROT 17.7* 21.0* 21.8* 25.3*  INR 1.4* 1.7* 1.8* 2.2*  CREATININE 0.63 0.63  --  0.66    Estimated Creatinine Clearance: 123.1 mL/min (by C-G formula based on SCr of 0.66 mg/dL).   Medical History: Past Medical History:  Diagnosis Date   Chest pain 2001   [place], normal cardiac workup   Coronary artery disease    Diabetes mellitus without complication (HCC)    Dyslipidemia    H/O echocardiogram 09/2013   PFO found incidentally.  Echo done due to murmur   Heart murmur    mild aortic stenosis   Hyperlipidemia    Hypertension    Personal history of colonic polyp - adenoma 01/05/2014   Sleep apnea    cpap   Tobacco use disorder    Wears glasses     Medications:  Scheduled:   acetaminophen   650 mg Oral Q6H   amiodarone   400 mg Oral BID   aspirin  EC  81 mg Oral Daily   Or   aspirin   81 mg Per Tube Daily   bisacodyl   10 mg Oral Daily   Or   bisacodyl   10 mg Rectal Daily   Chlorhexidine  Gluconate Cloth  6 each Topical Daily   docusate sodium   200 mg Oral Daily   ezetimibe   10 mg Oral Daily   furosemide   40 mg Oral Daily   insulin  aspart  0-20 Units Subcutaneous TID WC   insulin  aspart  0-5 Units Subcutaneous QHS   insulin  glargine  18 Units Subcutaneous Daily   lidocaine   1 patch Transdermal Q24H   metoprolol  tartrate  12.5 mg Oral BID   Or   metoprolol  tartrate  12.5 mg Per Tube BID   nicotine   14 mg Transdermal Daily    pantoprazole   40 mg Oral Daily   [START ON 08/31/2024] potassium chloride   20 mEq Oral Daily   potassium chloride   40 mEq Oral BID   rosuvastatin   40 mg Oral Daily   sodium chloride  flush  3 mL Intravenous Q12H   sodium chloride  flush  3 mL Intravenous Q12H   Warfarin - Pharmacist Dosing Inpatient   Does not apply q1600   Infusions:    PRN: dextrose , metoprolol  tartrate, ondansetron  (ZOFRAN ) IV, oxyCODONE , sodium chloride  flush, sodium chloride  flush, traMADol   Assessment: 58 yo male s/p elective CABG with aortic valve replacement with On-X mechanical. Warfarin per pharmacy started POD1.   INR therapeutic today (2.2) after 5 mg the past two days. Patient still on lower end of goal range, so will give 4 mg x1 today before discharge. Discharging today on 4 mg daily and a lower amiodarone  dose, so hopeful the INR won't increase significantly. Patient has follow-up/INR check on Monday. CBC stable.   Goal of Therapy:  INR 2-3 (eventually 1.5-2)   Plan:  Warfarin 4mg  PO x 1 today before discharge Follow up INR with outpatient cardiologist  Izetta Carl, PharmD PGY1 Pharmacy Resident       [1] No Known Allergies

## 2024-08-30 NOTE — Progress Notes (Signed)
 Physical Therapy Treatment Patient Details Name: Chris Skinner MRN: [id number] DOB: 1966-08-17 Today's Date: 08/30/2024   History of Present Illness Pt is a 58 y.o. male who presented 08/25/24 for AVR. PMH: CAD, DM, heart murmur, HLD, HTN, sleep apnea, tobacco use disorder    PT Comments  Pt seen for stair training given anticipated d/c home today. He remains able to complete sit-stand transfers and mobility with good adherence to sternal precautions and without assistance. Pt completed x7 stairs without assist, but use of L rail. HR stable but pt reports fatigue. Discussed progressive walking program and energy conservation strategies to improve safety at home while recovering and pt verbalized understanding.     If plan is discharge home, recommend the following: A little help with walking and/or transfers;A little help with bathing/dressing/bathroom;Assistance with cooking/housework;Assist for transportation;Help with stairs or ramp for entrance   Can travel by private vehicle        Equipment Recommendations  None recommended by PT    Recommendations for Other Services       Precautions / Restrictions Precautions Precautions: Fall;Sternal;Other (comment) Precaution Booklet Issued: No Recall of Precautions/Restrictions: Intact Restrictions Weight Bearing Restrictions Per Provider Order: No Other Position/Activity Restrictions: sternal precautions     Mobility  Bed Mobility Overal bed mobility: Needs Assistance Bed Mobility: Rolling, Sidelying to Sit Rolling: Contact guard assist Sidelying to sit: Min assist       General bed mobility comments: light min A to elevate trunk to sitting, cues for sequencing throughout    Transfers Overall transfer level: Needs assistance Equipment used: Rolling walker (2 wheels) Transfers: Sit to/from Stand Sit to Stand: Supervision           General transfer comment: S for safety, good use of rocking for momentum with hands  crossed at chest on rise and when coming to sit    Ambulation/Gait Ambulation/Gait assistance: Supervision Gait Distance (Feet): 250 Feet Assistive device: Rolling walker (2 wheels) Gait Pattern/deviations: Step-through pattern, Decreased stride length, Antalgic Gait velocity: reduced Gait velocity interpretation: <1.8 ft/sec, indicate of risk for recurrent falls   General Gait Details: Pt ambulating slow and guarded, no LOB, supervsion for safety   Stairs Stairs: Yes Stairs assistance: Contact guard assist Stair Management: One rail Left, Alternating pattern, Forwards Number of Stairs: 7 General stair comments: HR 83bpm, pt feels slightly winded   Wheelchair Mobility     Tilt Bed    Modified Rankin (Stroke Patients Only)       Balance Overall balance assessment: Needs assistance Sitting-balance support: No upper extremity supported, Feet supported Sitting balance-Leahy Scale: Fair Sitting balance - Comments: static sitting EOB with supervision for safety   Standing balance support: Bilateral upper extremity supported, During functional activity, Reliant on assistive device for balance, No upper extremity supported Standing balance-Leahy Scale: Good Standing balance comment: can ambulate without RW but prefers BUE support for longer distance                            Communication Communication Communication: No apparent difficulties  Cognition Arousal: Alert Behavior During Therapy: WFL for tasks assessed/performed   PT - Cognitive impairments: No apparent impairments                       PT - Cognition Comments: Recalls sternal precautions and complies to them well Following commands: Intact      Cueing Cueing Techniques: Verbal cues  Exercises  General Comments General comments (skin integrity, edema, etc.): VSS on RA      Pertinent Vitals/Pain Pain Assessment Pain Assessment: Faces Faces Pain Scale: Hurts a little  bit Pain Location: sternum Pain Descriptors / Indicators: Discomfort, Grimacing, Operative site guarding Pain Intervention(s): Monitored during session    Home Living                          Prior Function            PT Goals (current goals can now be found in the care plan section) Acute Rehab PT Goals Patient Stated Goal: to improve PT Goal Formulation: With patient/family Time For Goal Achievement: 09/10/24 Potential to Achieve Goals: Good Progress towards PT goals: Progressing toward goals    Frequency    Min 2X/week      PT Plan      Co-evaluation              AM-PAC PT 6 Clicks Mobility   Outcome Measure  Help needed turning from your back to your side while in a flat bed without using bedrails?: A Little Help needed moving from lying on your back to sitting on the side of a flat bed without using bedrails?: A Little Help needed moving to and from a bed to a chair (including a wheelchair)?: A Little Help needed standing up from a chair using your arms (e.g., wheelchair or bedside chair)?: A Little Help needed to walk in hospital room?: A Little Help needed climbing 3-5 steps with a railing? : A Little 6 Click Score: 18    End of Session Equipment Utilized During Treatment: Gait belt Activity Tolerance: Patient tolerated treatment well Patient left: in bed;with call bell/phone within reach;with family/visitor present Nurse Communication: Mobility status PT Visit Diagnosis: Unsteadiness on feet (R26.81);Other abnormalities of gait and mobility (R26.89);Difficulty in walking, not elsewhere classified (R26.2);Pain Pain - Right/Left:  (sternum) Pain - part of body:  (sternum)     Time: 9146-9084 PT Time Calculation (min) (ACUTE ONLY): 22 min  Charges:    $Therapeutic Exercise: 8-22 mins PT General Charges $$ ACUTE PT VISIT: 1 Visit                     Izetta Call, PT, DPT   Acute Rehabilitation Department Office  [phone] Secure Chat Communication Preferred   Izetta JULIANNA Call 08/30/2024, 10:12 AM

## 2024-08-30 NOTE — Plan of Care (Signed)
  Problem: Education: Goal: Knowledge of General Education information will improve Description: Including pain rating scale, medication(s)/side effects and non-pharmacologic comfort measures Outcome: Progressing   Problem: Health Behavior/Discharge Planning: Goal: Ability to manage health-related needs will improve Outcome: Progressing   Problem: Clinical Measurements: Goal: Ability to maintain clinical measurements within normal limits will improve Outcome: Progressing Goal: Will remain free from infection Outcome: Progressing Goal: Diagnostic test results will improve Outcome: Progressing Goal: Respiratory complications will improve Outcome: Progressing Goal: Cardiovascular complication will be avoided Outcome: Progressing   Problem: Activity: Goal: Risk for activity intolerance will decrease Outcome: Progressing   Problem: Nutrition: Goal: Adequate nutrition will be maintained Outcome: Progressing   Problem: Coping: Goal: Level of anxiety will decrease Outcome: Progressing   Problem: Elimination: Goal: Will not experience complications related to bowel motility Outcome: Progressing Goal: Will not experience complications related to urinary retention Outcome: Progressing   Problem: Pain Managment: Goal: General experience of comfort will improve and/or be controlled Outcome: Progressing   Problem: Safety: Goal: Ability to remain free from injury will improve Outcome: Progressing   Problem: Skin Integrity: Goal: Risk for impaired skin integrity will decrease Outcome: Progressing   Problem: Education: Goal: Will demonstrate proper wound care and an understanding of methods to prevent future damage Outcome: Progressing Goal: Knowledge of disease or condition will improve Outcome: Progressing Goal: Knowledge of the prescribed therapeutic regimen will improve Outcome: Progressing Goal: Individualized Educational Video(s) Outcome: Progressing   Problem:  Activity: Goal: Risk for activity intolerance will decrease Outcome: Progressing   Problem: Cardiac: Goal: Will achieve and/or maintain hemodynamic stability Outcome: Progressing   Problem: Clinical Measurements: Goal: Postoperative complications will be avoided or minimized Outcome: Progressing   Problem: Respiratory: Goal: Respiratory status will improve Outcome: Progressing   Problem: Skin Integrity: Goal: Wound healing without signs and symptoms of infection Outcome: Progressing Goal: Risk for impaired skin integrity will decrease Outcome: Progressing   Problem: Urinary Elimination: Goal: Ability to achieve and maintain adequate renal perfusion and functioning will improve Outcome: Progressing   Problem: Education: Goal: Ability to describe self-care measures that may prevent or decrease complications (Diabetes Survival Skills Education) will improve Outcome: Progressing Goal: Individualized Educational Video(s) Outcome: Progressing   Problem: Coping: Goal: Ability to adjust to condition or change in health will improve Outcome: Progressing   Problem: Fluid Volume: Goal: Ability to maintain a balanced intake and output will improve Outcome: Progressing   Problem: Health Behavior/Discharge Planning: Goal: Ability to identify and utilize available resources and services will improve Outcome: Progressing Goal: Ability to manage health-related needs will improve Outcome: Progressing   Problem: Metabolic: Goal: Ability to maintain appropriate glucose levels will improve Outcome: Progressing   Problem: Nutritional: Goal: Maintenance of adequate nutrition will improve Outcome: Progressing Goal: Progress toward achieving an optimal weight will improve Outcome: Progressing   Problem: Skin Integrity: Goal: Risk for impaired skin integrity will decrease Outcome: Progressing   Problem: Tissue Perfusion: Goal: Adequacy of tissue perfusion will improve Outcome:  Progressing

## 2024-08-30 NOTE — Progress Notes (Addendum)
" ° °   °              [address]           [place], [place] [address]                     [phone]        5 Days Post-Op Procedures (LRB): REPLACEMENT, AORTIC VALVE, OPEN USING On-X PROSTHETIC HEART VALVE (N/A) ENDOSCOPICALLY HARVESTED RIGHT GREATER SAPHENOUS VEIN (N/A) ECHOCARDIOGRAM, TRANSESOPHAGEAL, INTRAOPERATIVE (N/A)  Subjective: Patient about to eat breakfast this am. He has no complaints and hopes to go home.  Objective: Vital signs in last 24 hours: Temp:  [97.8 F (36.6 C)-99.3 F (37.4 C)] 98.5 F (36.9 C) (12/20 0315) Pulse Rate:  [67-82] 73 (12/20 0521) Cardiac Rhythm: Atrial fibrillation;Bundle branch block (12/19 1900) Resp:  [16-25] 17 (12/20 0521) BP: (119-158)/(77-89) 123/79 (12/20 0315) SpO2:  [92 %-98 %] 98 % (12/20 0521) Weight:  [99.9 kg] 99.9 kg (12/20 0521)  Pre op weight 99.8 kg Current Weight  08/30/24 99.9 kg      Intake/Output from previous day: 12/19 0701 - 12/20 0700 In: 240 [P.O.:240] Out: 1200 [Urine:1200]   Physical Exam:  Cardiovascular: RRR, sharp valve click, no murmur Pulmonary: Slightly diminished bibasilar breath sounds Abdomen: Soft, non tender, bowel sounds present. Extremities: Trace lower extremity edema. Wound: Clean and dry.  No erythema or signs of infection.  Lab Results: CBC: Recent Labs    08/29/24 0339 08/30/24 0319  WBC 11.4* 9.3  HGB 10.9* 10.1*  HCT 30.0* 27.7*  PLT 255 330   BMET:  Recent Labs    08/29/24 0339 08/30/24 0319  NA 136 138  K 3.4* 3.5  CL 101 104  CO2 25 24  GLUCOSE 111* 115*  BUN 19 20  CREATININE 0.63 0.66  CALCIUM  8.6* 8.5*    PT/INR:  Lab Results  Component Value Date   INR 2.2 (H) 08/30/2024   INR 1.8 (H) 08/29/2024   INR 1.7 (H) 08/29/2024   ABG:  INR: Will add last result for INR, ABG once components are confirmed Will add last 4 CBG results once components are confirmed  Assessment/Plan:  1. CV - A fib with CVR. SR with HR in the  70's this am. On Amiodarone  400 mg bid, Lopressor  12.5 mg bid,  and Coumadin . INR this am 2.2. As discussed with Dr. Daniel, will discharge on 4 mg of Coumadin  2.  Pulmonary - On room air. Encourage incentive spirometer. 3. Above pre op weight, requires further diuresis-will give Lasix  40 mg today and continue for several days after discharge 4.  Expected post op acute blood loss anemia - H and H this am slightly decreased to 10.1 and 27.7 5. DM-CBGs 95/113/105. On Insulin  and Farxiga . He was only on Farxiga  prior to surgery so will stop Insulin . Pre op HGA1C 6. 6. Potassium 3.5-supplement 7. Disposition-home. Chest tube sutures will be removed in the office after discharge  Donielle M ZimmermanPA-C 7:24 AM  INR 2.2, still in rate-controlled Afib - asymptomatic and BP is stable.  Home on amio and coumadin  4 mg at bedtime.;  About at his pre-op weight, will just continue gentle diuresis at home for a few days.  INR check on Monday  Con Daniel, MD Cardiothoracic Surgery Pager: [phone]  "

## 2024-08-30 NOTE — Plan of Care (Signed)
   Problem: Health Behavior/Discharge Planning: Goal: Ability to manage health-related needs will improve Outcome: Progressing

## 2024-08-30 NOTE — Progress Notes (Signed)
 Patient discharged home, taken to front lobby by wheelchair per this RN. Dose of 4mg  PO warfarin administered prior to d/c. TOC pharmacy transferred warfarin to his [facility] for pick up.

## 2024-09-01 ENCOUNTER — Telehealth (HOSPITAL_COMMUNITY): Payer: Self-pay

## 2024-09-01 ENCOUNTER — Ambulatory Visit: Attending: Cardiovascular Disease

## 2024-09-01 ENCOUNTER — Telehealth: Payer: Self-pay

## 2024-09-01 DIAGNOSIS — Z7901 Long term (current) use of anticoagulants: Secondary | ICD-10-CM | POA: Insufficient documentation

## 2024-09-01 DIAGNOSIS — Z952 Presence of prosthetic heart valve: Secondary | ICD-10-CM | POA: Diagnosis not present

## 2024-09-01 LAB — POCT INR: INR: 3.2 — AB (ref 2.0–3.0)

## 2024-09-01 MED ORDER — WARFARIN SODIUM 4 MG PO TABS: ORAL_TABLET | ORAL | 0 refills | Status: DC

## 2024-09-01 NOTE — Patient Instructions (Signed)
 Description   INR 3.2, Skip today's dosage of Warfarin, then start taking 1/2 tablet daily except 1 tablet on Sundays, Tuesdays and Thursdays. Coumadin  Clinic [phone] call with medication changes or bleeding issues.     A full discussion of the nature of anticoagulants has been carried out.  A benefit risk analysis has been presented to the patient, so that they understand the justification for choosing anticoagulation at this time. The need for frequent and regular monitoring, precise dosage adjustment and compliance is stressed.  Side effects of potential bleeding are discussed.  The patient should avoid any OTC items containing aspirin  or ibuprofen, and should avoid great swings in general diet.  Avoid alcohol consumption.  Call if any signs of abnormal bleeding.

## 2024-09-01 NOTE — Transitions of Care (Post Inpatient/ED Visit) (Signed)
" ° °  09/01/2024  Name: Chris Skinner MRN: [id number] DOB: 1966-06-01  Today's TOC FU Call Status: Today's TOC FU Call Status:: Unsuccessful Call (1st Attempt) Unsuccessful Call (1st Attempt) Date: 09/01/24  Attempted to reach the patient regarding the most recent Inpatient/ED visit.  Follow Up Plan: Additional outreach attempts will be made to reach the patient to complete the Transitions of Care (Post Inpatient/ED visit) call.    Bing Edison MSN, RN RN Case Sales [facility]  [facility] Office Hours M-F [phone] Direct Dial: [phone] Main Phone [phone]  Fax: [phone] [email or web address]    "

## 2024-09-01 NOTE — Telephone Encounter (Signed)
 Called patient to see if he is interested in the Cardiac Rehab Program. Patient expressed interest. Explained scheduling process. patient verbalized understanding. Will contact patient for scheduling once f/u has been completed.

## 2024-09-01 NOTE — Progress Notes (Signed)
 Description   INR 3.2, Skip today's dosage of Warfarin, then start taking 1/2 tablet daily except 1 tablet on Sundays, Tuesdays and Thursdays. Recheck INR in 1 week.  Coumadin  Clinic [phone] call with medication changes or bleeding issues.

## 2024-09-02 ENCOUNTER — Telehealth: Payer: Self-pay

## 2024-09-02 ENCOUNTER — Telehealth: Payer: Self-pay | Admitting: Cardiovascular Disease

## 2024-09-02 NOTE — Telephone Encounter (Signed)
 Medication looks different than what Chris Skinner explained and described to us . She wanted to us  to call her back about it to be sure about the dosage. warfarin (COUMADIN ) 4 MG tablet

## 2024-09-02 NOTE — Telephone Encounter (Signed)
 Returned call to [place] on [facility] and she states that the warfarin tablet is bluish/greenish color. Advised that the warfarin 4mg  tablet is a bluish green in color and in some manufactures it does look green in color. She is aware that what she is describing is a 4mg  tablet and to take as directed by last instructions given by Bernice at visit. She was thankful for the call back.

## 2024-09-02 NOTE — Transitions of Care (Post Inpatient/ED Visit) (Signed)
 Today's TOC FU Call Status: Today's TOC FU Call Status:: Successful TOC FU Call Completed TOC FU Call Complete Date: 09/02/24  Patient's Name and Date of Birth confirmed. Name, DOB  Situation: S/p Aortic Valve Replacement on 08/25/24 at [facility]. Discharged 08/30/24.   Transition Care Management Follow-up Telephone Call Discharge Facility: [facility]) Type of Discharge: Inpatient Admission Primary Inpatient Discharge Diagnosis:: S/p Aortic Valve Replacement How have you been since you were released from the hospital?: Better Any questions or concerns?: Yes Patient Questions/Concerns:: Patient had questions about the actual pill that was given as Coumadin  by the pharmacy and had already reached out to the Coumadin  Clinic at [phone] and was waiting on a response. RN CM confirmed this was correct thing to do but could also confirm with pharmcist dispensing what the pill should look like depending on manufacturer. Patient and spouse verbally understood and stated they were all good on the general questions RN CM asked and declined rest of call. Patient Questions/Concerns Addressed: Other: (See above note under questions and concerns.)  Items Reviewed: Did you receive and understand the discharge instructions provided?: Yes Any new allergies since your discharge?: No Dietary orders reviewed?: NA Do you have support at home?: Yes People in Home [RPT]: significant other Name of Support/Comfort Primary Source: Was not given. Sister is emergency contact number list.  Medications Reviewed Today: Medications Reviewed Today     Reviewed by Carolee Heron NOVAK, RN (Case Manager) on 09/02/24 at 1002  Med List Status: <None>   Medication Order Taking? Sig Documenting Provider Last Dose Status Informant  acetaminophen  (TYLENOL ) 325 MG tablet [id number] Yes Take 2 tablets (650 mg total) by mouth every 6 (six) hours. Zimmerman, Donielle M, PA-C  Active   amiodarone  (PACERONE ) 200 MG tablet  [id number] Yes Take 200 mg bid for 7  days;then take 200 mg daily thereafter Zimmerman, Donielle M, PA-C  Active   aspirin  EC 81 MG tablet [id number] Yes Take 1 tablet (81 mg total) by mouth daily. Swallow whole. [name], MD  Active Self  cetirizine (ZYRTEC) 10 MG chewable tablet [id number] Yes Chew 10 mg by mouth in the morning. [provider]  Active Self  dapagliflozin  propanediol (FARXIGA ) 5 MG TABS tablet [id number] Yes Take 1 tablet (5 mg total) by mouth daily before breakfast. [name], MD  Active Self  ezetimibe  (ZETIA ) 10 MG tablet [id number] Yes TAKE 1 TABLET BY MOUTH EVERY DAY [name], MD  Active Self  furosemide  (LASIX ) 40 MG tablet [id number] Yes Take 1 tablet (40 mg total) by mouth daily. For 4 days then stop Zimmerman, Donielle M, PA-C  Active   Investigational - Study Medication [id number] Yes Take 1 tablet by mouth daily. Study name:  PURSUIT Phase IIb trial for [employer]'s [id number] Additional study details: AZD0780 30 mg or placebo  On discharge 08/30/24 medication list indicates to resume home schedule on this medication. [provider]  Active Self           Med Note SYDELL, HERON NOVAK   Tue Sep 02, 2024 10:01 AM) On discharge 08/30/24 medication list indicates to resume home schedule on this medication.   lidocaine  (LIDODERM ) 5 % [id number] Yes Place 1 patch onto the skin daily. Remove & Discard patch within 12 hours or as directed by MD Dwan Kyla HERO, PA-C  Active   metoprolol  tartrate (LOPRESSOR ) 25 MG tablet [id number] Yes Take 0.5 tablets (12.5 mg total) by mouth 2 (two) times daily. Zimmerman, Donielle M,  PA-C  Active   Multiple Vitamins-Minerals (MULTIVITAMIN WITH MINERALS) tablet [id number] Yes Take 2 tablets by mouth in the morning. [provider]  Active Self  nicotine  (NICODERM CQ  - DOSED IN MG/24 HOURS) 14 mg/24hr patch [id number] Yes Place 1 patch (14 mg total) onto the skin daily. Dwan Kyla HERO, PA-C   Active   oxyCODONE  10 MG TABS [id number] Yes Take 1 tablet (10 mg total) by mouth every 4 (four) hours as needed for severe pain (pain score 7-10). Dwan Kyla HERO, PA-C  Active   potassium chloride  SA (KLOR-CON  M) 20 MEQ tablet [id number] Yes Take 1 tablet (20 mEq total) by mouth daily. For 4 days then stop Zimmerman, Donielle M, PA-C  Active   rosuvastatin  (CRESTOR ) 40 MG tablet [id number] Yes TAKE 1 TABLET BY MOUTH EVERY DAY [name], MD  Active Self  warfarin (COUMADIN ) 4 MG tablet [id number] Yes Take 1/2 to 1 tablet once daily by mouth as directed by anticoagulation clinic.  Patient taking differently: Take 1/2 to 1 tablet once daily by mouth as directed by anticoagulation clinic. Per 09/01/24 Coumadin  Clinic note: INR 3.2, Skip today's dosage of Warfarin, then start taking 1/2 tablet daily except 1 tablet on Sundays, Tuesdays and Thursdays. Coumadin  Clinic [phone] call with medication changes or bleeding issues.   [name], MD  Active             Home Care and Equipment/Supplies: Were Home Health Services Ordered?: No Any new equipment or medical supplies ordered?: No  Functional Questionnaire: Do you need assistance with bathing/showering or dressing?: No Do you need assistance with meal preparation?: No Do you need assistance with eating?: No Do you have difficulty maintaining continence: No Do you need assistance with getting out of bed/getting out of a chair/moving?: No Do you have difficulty managing or taking your medications?: No  Follow up appointments reviewed: PCP Follow-up appointment confirmed?: No (Patient has contact information for HFU. Rationale explained.) MD Provider Line Number:[phone] Given: No Specialist Hospital Follow-up appointment confirmed?: Yes Date of Specialist follow-up appointment?: 09/09/24 Follow-Up Specialty Provider:: Manuelita Rough [facility]  at 1100 am; at [phone]  (Also has another  Coumadin  Clinic follow up on 08/30/24 at 1 pm and also had that contact number: [phone].) Do you need transportation to your follow-up appointment?: No Do you understand care options if your condition(s) worsen?: Yes-patient verbalized understanding  SDOH Interventions Today    Flowsheet Row Most Recent Value  SDOH Interventions   Food Insecurity Interventions Intervention Not Indicated  Housing Interventions Intervention Not Indicated  Transportation Interventions Intervention Not Indicated  Utilities Interventions Intervention Not Indicated  Health Literacy Interventions Intervention Not Indicated   09/02/24: Successful post discharge outreach with patient and significant other by phone today.  Patient understands discharge instructions and what to call provider for if condition worsens.  Patient to call to to schedule PCP HFU per preference.  Patient has specialist follow up appointments and has been to Coumadin  Clinic on 09/01/24.  The patient has been provided with contact information for the care management team and has been advised to call with any health-related questions or concerns.  The patient verbalized understanding with current POC.  The patient is directed to their insurance card regarding availability of benefits coverage  Patient was anxious to end call as waiting on call back from Coumadin  Clinic regarding the look of the pill dispensed, most items touched on quickly, patient stating he had things under control, no changes to  SDOH, understood discharge instructions, what to call provider for, no issues reported on assessment items, and no red flags noted on this briefer than desired call for this discharge diagnosis.     Bing Edison MSN, RN RN [employer] Sales [facility]  [facility] Office Hours M-F [phone] Direct Dial: [phone] Main Phone [phone]  Fax: [phone] [email or web address]

## 2024-09-08 ENCOUNTER — Ambulatory Visit: Attending: Cardiovascular Disease

## 2024-09-08 DIAGNOSIS — Z952 Presence of prosthetic heart valve: Secondary | ICD-10-CM | POA: Diagnosis not present

## 2024-09-08 DIAGNOSIS — Z7901 Long term (current) use of anticoagulants: Secondary | ICD-10-CM

## 2024-09-08 LAB — POCT INR: INR: 1.4 — AB (ref 2.0–3.0)

## 2024-09-08 NOTE — Progress Notes (Signed)
"   INR 1.4 Please see anticoagulation encounter Take another 0.5 tablet today only then increase to 1 tablet daily except 0.5 tablet every Monday, Wednesday and Friday. Recheck INR in 1 week.  Coumadin  Clinic [phone] call with medication changes or bleeding issues.  "

## 2024-09-08 NOTE — Patient Instructions (Signed)
 Take another 0.5 tablet today only then increase to 1 tablet daily except 0.5 tablet every Monday, Wednesday and Friday. Recheck INR in 1 week.  Coumadin  Clinic [phone] call with medication changes or bleeding issues.

## 2024-09-09 ENCOUNTER — Ambulatory Visit: Payer: Self-pay

## 2024-09-09 ENCOUNTER — Ambulatory Visit (HOSPITAL_COMMUNITY)
Admission: RE | Admit: 2024-09-09 | Discharge: 2024-09-09 | Disposition: A | Discharge status: Home / Self Care | Attending: Cardiovascular Disease | Admitting: Cardiovascular Disease

## 2024-09-09 VITALS — BP 153/87 | HR 66 | Resp 18 | Ht 73.52 in | Wt 221.0 lb

## 2024-09-09 DIAGNOSIS — I350 Nonrheumatic aortic (valve) stenosis: Secondary | ICD-10-CM | POA: Diagnosis present

## 2024-09-09 DIAGNOSIS — I2510 Atherosclerotic heart disease of native coronary artery without angina pectoris: Secondary | ICD-10-CM

## 2024-09-09 DIAGNOSIS — Z952 Presence of prosthetic heart valve: Secondary | ICD-10-CM

## 2024-09-09 MED ORDER — METHOCARBAMOL 500 MG PO TABS: 500.0000 mg | ORAL_TABLET | Freq: Four times a day (QID) | ORAL | 0 refills | Status: AC

## 2024-09-09 NOTE — Progress Notes (Addendum)
 "     [address] [place] [address]             [phone]       HPI:  Patient returns for routine postoperative follow-up having undergone aortic valve replacement using a 23 mm On-X valve and Right leg endoscopic vein harvest Adriana Cera, PA-C) on 08/25/2024 with Dr. Daniel.  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 He was routinely diuresed.  He was stable for discharge home on 08/30/2024.  Since hospital discharge the patient reports that he is doing well.  His pain is well controlled with tylenol .  He does report having some pain between his shoulder blades which he has been using icy-hot.  He has been active with interval walking around 5-10 minutes each time. Incision sites have been healing appropriately.  He denies chest pain, shortness of breath and lower leg swelling.  Allergies as of 09/09/2024   No Known Allergies      Medication List        Accurate as of September 09, 2024 12:10 PM. If you have any questions, ask your nurse or doctor.          STOP taking these medications    furosemide  40 MG tablet Commonly known as: LASIX    potassium chloride  SA 20 MEQ tablet Commonly known as: KLOR-CON  M       TAKE these medications    acetaminophen  325 MG tablet Commonly known as: TYLENOL  Take 2 tablets (650 mg total) by mouth every 6 (six) hours.   amiodarone  200 MG tablet Commonly known as: PACERONE  Take 200 mg bid for 7  days;then take 200 mg daily thereafter   aspirin  EC 81 MG tablet Take 1 tablet (81 mg total) by mouth daily. Swallow whole.   cetirizine 10 MG chewable tablet Commonly known as: ZYRTEC Chew 10 mg by mouth in the morning.   dapagliflozin  propanediol 5 MG Tabs tablet Commonly known as: Farxiga  Take 1 tablet (5 mg total) by mouth daily before  breakfast.   ezetimibe  10 MG tablet Commonly known as: ZETIA  TAKE 1 TABLET BY MOUTH EVERY DAY   Investigational - Study Medication Take 1 tablet by mouth daily. Study name:  PURSUIT Phase IIb trial for [employer]'s JSI9219 Additional study details: AZD0780 30 mg or placebo  On discharge 08/30/24 medication list indicates to resume home schedule on this medication.   lidocaine  5 % Commonly known as: LIDODERM  Place 1 patch onto the skin daily. Remove & Discard patch within 12 hours or as directed by MD   methocarbamol  500 MG tablet Commonly known as: ROBAXIN  Take 1 tablet (500 mg total) by mouth 4 (four) times daily for 7 days.   metoprolol  tartrate 25 MG tablet Commonly known as: LOPRESSOR  Take 0.5 tablets (12.5 mg total) by mouth 2 (two) times daily.   multivitamin with minerals tablet Take 2 tablets by mouth in the morning.   nicotine  14 mg/24hr patch Commonly known as: NICODERM CQ  - dosed in mg/24 hours Place 1 patch (14 mg total) onto the skin daily.   Oxycodone  HCl 10 MG Tabs Take 1 tablet (10 mg total) by mouth every 4 (four) hours as needed for severe pain (pain score 7-10).   rosuvastatin  40 MG tablet Commonly known as: CRESTOR  TAKE 1 TABLET BY MOUTH EVERY DAY   warfarin  4 MG tablet Commonly known as: COUMADIN  Take as directed by the anticoagulation clinic. If you are unsure how to take this medication, talk to your nurse or doctor. Original instructions: Take 1/2 to 1 tablet once daily by mouth as directed by anticoagulation clinic. What changed: additional instructions         ROS Review of Systems  Constitutional: Negative.  Negative for fever and malaise/fatigue.  Respiratory: Negative.  Negative for cough and shortness of breath.   Cardiovascular: Negative.  Negative for chest pain, palpitations and leg swelling.    Physical Exam Constitutional:      Appearance: Normal appearance.  HENT:     Head: Normocephalic and atraumatic.  Cardiovascular:      Rate and Rhythm: Normal rate and regular rhythm.     Heart sounds: Normal heart sounds, S1 normal and S2 normal.     Comments: Mechanical click  Pulmonary:     Effort: Pulmonary effort is normal.     Breath sounds: Normal breath sounds.  Skin:    General: Skin is warm and dry.      Neurological:     General: No focal deficit present.     Mental Status: He is alert and oriented to person, place, and time.      BP (!) 153/87 (BP Location: Right Arm)   Pulse 66   Resp 18   Ht 6' 1.52 (1.867 m)   Wt 221 lb (100.2 kg)   SpO2 96% Comment: RA  BMI 28.75 kg/m      Imaging: CLINICAL DATA:  Status post aortic valve repair   EXAM: CHEST - 2 VIEW   COMPARISON:  August 29, 2024   FINDINGS: Stable cardiomediastinal silhouette. Sternotomy wires are noted. No pneumothorax is noted. Small bilateral pleural effusions are noted.   IMPRESSION: Small bilateral pleural effusions.     Electronically Signed   By: Lynwood Landy Raddle M.D.   On: 09/09/2024 12:00   Assessment/Plan:  S/P AVR (aortic valve replacement) -We reviewed today's chest x ray. Bilateral trace pleural effusions, he should continue to use his incentive spirometer -He is to continue to increase his activity as tolerated -Discussed continuance of sternal precautions until a full 6 weeks from surgery -He should keep incision sites clean with soap and water  and pat dry -Prescribed robaxin  for patient at this time to help with shoulder pain -He has been seen by the coumadin  clinic on 09/08/2024. His INR was 1.4. He is to follow dosing adjustments as directed -Discussed the need for prophylactic antibiotics for all dental cleaning/procedures and minor surgeries -He has follow up with cardiology on 09/19/2023 -Follow up in 2 weeks with Dr. Daniel with chest xray        Manuelita CHRISTELLA Rough, PA-C 12:10 PM 09/09/2024  "

## 2024-09-09 NOTE — Patient Instructions (Signed)
 You may continue to gradually increase your physical activity as tolerated.  Refrain from any heavy lifting or strenuous use of your arms and shoulders until at least 6 weeks from the time of your surgery, and avoid activities that cause increased pain in your chest on the side of your surgical incision.  Otherwise you may continue to increase activities without any particular limitations.  Increase the intensity and duration of physical activity gradually.   Endocarditis is a potentially serious infection of heart valves or inside lining of the heart.  It occurs more commonly in patients with diseased heart valves (such as patient's with aortic or mitral valve disease) and in patients who have undergone heart valve repair or replacement.  Certain surgical and dental procedures may put you at risk, such as dental cleaning, other dental procedures, or any surgery involving the respiratory, urinary, gastrointestinal tract, gallbladder or prostate gland.   To minimize your chances for develooping endocarditis, maintain good oral health and seek prompt medical attention for any infections involving the mouth, teeth, gums, skin or urinary tract.    Always notify your doctor or dentist about your underlying heart valve condition before having any invasive procedures. You will need to take antibiotics before certain procedures, including all routine dental cleanings or other dental procedures.  Your cardiologist or dentist should prescribe these antibiotics for you to be taken ahead of time.

## 2024-09-11 ENCOUNTER — Other Ambulatory Visit: Payer: Self-pay | Admitting: Family Medicine

## 2024-09-11 DIAGNOSIS — E119 Type 2 diabetes mellitus without complications: Secondary | ICD-10-CM

## 2024-09-18 ENCOUNTER — Ambulatory Visit: Attending: Cardiovascular Disease | Admitting: Pharmacist

## 2024-09-18 DIAGNOSIS — Z952 Presence of prosthetic heart valve: Secondary | ICD-10-CM

## 2024-09-18 DIAGNOSIS — Z7901 Long term (current) use of anticoagulants: Secondary | ICD-10-CM

## 2024-09-18 LAB — POCT INR: INR: 1.2 — AB (ref 2.0–3.0)

## 2024-09-18 MED ORDER — WARFARIN SODIUM 4 MG PO TABS: ORAL_TABLET | ORAL | 0 refills | Status: DC

## 2024-09-18 NOTE — Patient Instructions (Signed)
 Description   INR 1.2 Take 1.5 tablets today and a whole tablet tomorrow and then continue  1 tablet daily except 0.5 tablet every Monday, Wednesday and Friday. Recheck INR in 1 week.    Coumadin  Clinic [phone] call with medication changes or bleeding issues.

## 2024-09-18 NOTE — Progress Notes (Signed)
 Description   INR 1.2 Take 1.5 tablets today and a whole tablet tomorrow and then continue  1 tablet daily except 0.5 tablet every Monday, Wednesday and Friday. Recheck INR in 1 week.    Coumadin  Clinic [phone] call with medication changes or bleeding issues.

## 2024-09-21 ENCOUNTER — Other Ambulatory Visit: Payer: Self-pay | Admitting: Physician Assistant

## 2024-09-23 DIAGNOSIS — H9190 Unspecified hearing loss, unspecified ear: Secondary | ICD-10-CM | POA: Diagnosis not present

## 2024-09-23 DIAGNOSIS — I2510 Atherosclerotic heart disease of native coronary artery without angina pectoris: Secondary | ICD-10-CM | POA: Diagnosis not present

## 2024-09-23 DIAGNOSIS — I10 Essential (primary) hypertension: Secondary | ICD-10-CM | POA: Diagnosis not present

## 2024-09-23 DIAGNOSIS — Z85038 Personal history of other malignant neoplasm of large intestine: Secondary | ICD-10-CM | POA: Diagnosis not present

## 2024-09-23 DIAGNOSIS — E785 Hyperlipidemia, unspecified: Secondary | ICD-10-CM | POA: Diagnosis not present

## 2024-09-23 DIAGNOSIS — I4891 Unspecified atrial fibrillation: Secondary | ICD-10-CM | POA: Diagnosis not present

## 2024-09-23 DIAGNOSIS — G4730 Sleep apnea, unspecified: Secondary | ICD-10-CM | POA: Diagnosis not present

## 2024-09-23 DIAGNOSIS — F17210 Nicotine dependence, cigarettes, uncomplicated: Secondary | ICD-10-CM | POA: Diagnosis not present

## 2024-09-23 DIAGNOSIS — Z48812 Encounter for surgical aftercare following surgery on the circulatory system: Secondary | ICD-10-CM | POA: Diagnosis not present

## 2024-09-23 DIAGNOSIS — Z7982 Long term (current) use of aspirin: Secondary | ICD-10-CM | POA: Diagnosis not present

## 2024-09-23 DIAGNOSIS — E119 Type 2 diabetes mellitus without complications: Secondary | ICD-10-CM | POA: Diagnosis not present

## 2024-09-23 DIAGNOSIS — Z952 Presence of prosthetic heart valve: Secondary | ICD-10-CM | POA: Diagnosis not present

## 2024-09-24 ENCOUNTER — Other Ambulatory Visit: Payer: Self-pay

## 2024-09-24 ENCOUNTER — Ambulatory Visit (INDEPENDENT_AMBULATORY_CARE_PROVIDER_SITE_OTHER): Admitting: Pharmacist

## 2024-09-24 ENCOUNTER — Ambulatory Visit: Admitting: Cardiology

## 2024-09-24 VITALS — BP 150/84 | HR 56 | Ht 73.0 in | Wt 220.4 lb

## 2024-09-24 DIAGNOSIS — I10 Essential (primary) hypertension: Secondary | ICD-10-CM

## 2024-09-24 DIAGNOSIS — I2510 Atherosclerotic heart disease of native coronary artery without angina pectoris: Secondary | ICD-10-CM | POA: Diagnosis not present

## 2024-09-24 DIAGNOSIS — I4891 Unspecified atrial fibrillation: Secondary | ICD-10-CM

## 2024-09-24 DIAGNOSIS — Z952 Presence of prosthetic heart valve: Secondary | ICD-10-CM

## 2024-09-24 DIAGNOSIS — I9789 Other postprocedural complications and disorders of the circulatory system, not elsewhere classified: Secondary | ICD-10-CM

## 2024-09-24 DIAGNOSIS — E782 Mixed hyperlipidemia: Secondary | ICD-10-CM

## 2024-09-24 DIAGNOSIS — Z7901 Long term (current) use of anticoagulants: Secondary | ICD-10-CM

## 2024-09-24 LAB — POCT INR: INR: 1.4 — AB (ref 2.0–3.0)

## 2024-09-24 MED ORDER — AMOXICILLIN 500 MG PO TABS: ORAL_TABLET | ORAL | 2 refills | Status: AC

## 2024-09-24 MED ORDER — AMLODIPINE BESYLATE 5 MG PO TABS: 5.0000 mg | ORAL_TABLET | Freq: Every day | ORAL | 3 refills | Status: AC

## 2024-09-24 MED ORDER — LISINOPRIL-HYDROCHLOROTHIAZIDE 20-12.5 MG PO TABS: 1.0000 | ORAL_TABLET | Freq: Every day | ORAL | 3 refills | Status: AC

## 2024-09-24 NOTE — Patient Instructions (Signed)
 Medication Instructions:  Your physician has recommended you make the following change in your medication:  START Amlodipine  5 mg taking 1 daily  START Lisinopril -hydrochlorothiazide  20-12.5 taking 1 daily  START Amoxicillin  500 mg taking 4 tablets 30-60 mins prior to any major dental work  *If you need a refill on your cardiac medications before your next appointment, please call your pharmacy*  Lab Work: 1 WEEK, COME BACK TO THE LAB, FASTING:  LIPID & BMET  If you have labs (blood work) drawn today and your tests are completely normal, you will receive your results only by: MyChart Message (if you have MyChart) OR A paper copy in the mail If you have any lab test that is abnormal or we need to change your treatment, we will call you to review the results.  Testing/Procedures: None ordered  Follow-Up: At [facility], you and your health needs are our priority.  As part of our continuing mission to provide you with exceptional heart care, our providers are all part of one team.  This team includes your primary Cardiologist (physician) and Advanced Practice Providers or APPs (Physician Assistants and Nurse Practitioners) who all work together to provide you with the care you need, when you need it.  Your next appointment:   1 month(s)  Provider:   Darryle ONEIDA Decent, MD or Thom Sluder, PA-C          We recommend signing up for the patient portal called MyChart.  Sign up information is provided on this After Visit Summary.  MyChart is used to connect with patients for Virtual Visits (Telemedicine).  Patients are able to view lab/test results, encounter notes, upcoming appointments, etc.  Non-urgent messages can be sent to your provider as well.   To learn more about what you can do with MyChart, go to [email or web address].   Other Instructions

## 2024-09-24 NOTE — Patient Instructions (Signed)
 Description   INR 1.4: Increase dose to 4mg  daily (1 tablet) except for 2mg  (1/2 tablet) on Mondays  Recheck INR in 1 week.    Coumadin  Clinic [phone] call with medication changes or bleeding issues.

## 2024-09-24 NOTE — Progress Notes (Signed)
 Description   INR 1.4: Increase dose to 4mg  daily (1 tablet) except for 2mg  (1/2 tablet) on Mondays  Recheck INR in 1 week.    Coumadin  Clinic [phone] call with medication changes or bleeding issues.

## 2024-09-24 NOTE — Progress Notes (Addendum)
 " Cardiology Office Note:  .   Date:  09/24/2024  ID:  Chris Skinner, DOB [birth year], MRN [id number] PCP: [name], MD  [facility] Providers Cardiologist:  Darryle ONEIDA Decent, MD {  History of Present Illness: .   Chris Skinner is a 59 y.o. male with history of severe bicuspid aortic valve stenosis status post AVR 08/2024, postoperative atrial fibrillation, type 2 diabetes, tobacco abuse, hyperlipidemia, hypertension     Aortic stenosis History of bicuspid aortic valve with severe aortic stenosis. 08/25/2024 admitted electively and underwent aortic valve replacement with On-X mechanical #23 chemical prosthesis.  LHC with CTO of RCA with collaterals not amenable to CABG.  Had postoperative atrial fibrillation placed on amiodarone  drip and converted to sinus rhythm.  Placed on Coumadin .  CAD 07/2024 LHC single-vessel CTO of the mid RCA with left-to-right collaterals.  Not amenable to CABG.  Social history  Works as a Advertising Account Planner in [place]     Patient with known severe bicuspid aortic valve stenosis and CTO of the RCA.  Was admitted electively 08/2024 and underwent mechanical AVR.  Intraoperative TEE with no perivalvular leaks or significant gradient.  Has CTO of RCA not amenable to CABG.  Had postoperative atrial fibrillation and placed on amiodarone  but had converted to sinus rhythm and started on Coumadin  for anticoagulation.  Did not have any significant postoperative complications and progressed appropriately.  At discharge amlodipine  5 mg, lisinopril -HCTZ 20-12.5 mg naproxen were stopped at discharge.  He saw CT surgery 08/2024 and doing well, had trace bilateral pleural effusions on chest x-ray 12/30.  Today patient presents for hospital follow-up, accompanied with his sister and girlfriend.  He has no acute complaints today.  Reports mild complaints of muscle pains in his back shoulder and on muscle relaxers.  Has not had any chest pain, shortness of breath,  issues from his incision site.  Compliant with all of his medications.  Reports that his blood pressure has been elevated since discharge from around 150-170 systolics, previously well-controlled on prior therapy.  ROS: Denies: Chest pain, shortness of breath, orthopnea, peripheral edema, palpitations, syncope, decreased exercise capacity, fatigue, dizziness.   Studies Reviewed: SABRA    EKG Interpretation Date/Time:  Wednesday September 24 2024 14:47:52 EST Ventricular Rate:  56 PR Interval:  212 QRS Duration:  124 QT Interval:  472 QTC Calculation: 455 R Axis:   -20  Text Interpretation: Sinus bradycardia with 1st degree A-V block Possible Left atrial enlargement Left ventricular hypertrophy with QRS widening ( Cornell product ) Cannot rule out Septal infarct , age undetermined When compared with ECG of 28-Aug-2024 07:50, Sinus rhythm has replaced Atrial fibrillation Vent. rate has decreased BY  31 BPM Left bundle branch block is no longer Present Minimal criteria for Septal infarct are now Present Confirmed by Darryle Currier [phone]) on 09/24/2024 2:57:17 PM    Risk Assessment/Calculations:       Physical Exam:   VS:  BP (!) 150/84   Pulse (!) 56   Ht 6' 1 (1.854 m)   Wt 220 lb 6.4 oz (100 kg)   SpO2 97%   BMI 29.08 kg/m    Wt Readings from Last 3 Encounters:  09/24/24 220 lb 6.4 oz (100 kg)  09/09/24 221 lb (100.2 kg)  08/30/24 220 lb 3.8 oz (99.9 kg)    GEN: Well nourished, well developed in no acute distress NECK: No JVD; No carotid bruits CARDIAC: RRR, positive mechanical murmur RESPIRATORY:  Clear to auscultation  without rales, wheezing or rhonchi  ABDOMEN: Soft, non-tender, non-distended EXTREMITIES:  No edema; No deformity   ASSESSMENT AND PLAN: .    Bicuspid aortic valve with severe aortic stenosis -Status post mechanical AVR 08/2024.  Intraoperative TEE with stable device function. Progressing well.  Incision site looks okay.  Already saw CT surgery recently.  No  significant murmur on exam. Echocardiogram planned for 1/26 Prescribe prophylactic amoxicillin  2 g to be taken 30 to 60 minutes prior to any major dental procedures. Continue Coumadin  per Coumadin  clinic. Will see CT surgery soon with updated chest x-ray.  Postoperative atrial fibrillation Sinus rhythm today. Continue with Coumadin , Lopressor  12.5 mg twice daily, amiodarone  200 mg daily. For amiodarone  monitoring team we will obtain CMP in 1 week and TSH. Will discuss with MD timeframe of amiodarone  but likely will only keep in the postoperative period around 2 to 3 months.  CAD Hyperlipidemia - 07/2024 he has single-vessel CTO of the mid RCA with left-to-right collaterals.  Not amendable to CABG. No anginal complaints. Continue aspirin , rosuvastatin  40 mg, Zetia .  Repeat lipid panel.   LDL 99 03/2024. he is in a [employer] drug trial. Likely plan for continued medical management as long as he remains asymptomatic.  Will review with MD.  Type 2 diabetes Well-controlled A1c 6.0%.  Continue Farxiga .  Hypertension At discharge antihypertensives were held.  Will restart his amlodipine  5 mg and lisinopril -HCTZ 20-12.5 mg.  Repeat BMP 1 week.  Nicotine  dependence Has completely quit since his discharge.  Congratulated patient.  Reports over 40+ year history of smoking.  Should get lung cancer screening.  LVH Some discrepancy between TTE and TEE.  TEE reporting severely increased LV wall thickness and severe LVH not documented that same on TTE.  Will get MD input.    Cardiac Rehabilitation Eligibility Assessment         Dispo: 1 to 32-month follow-up to go over echocardiogram results and blood pressure.  AddendumBETHA Sermon with MD.  We can stop amiodarone  now since has been 1 month.  We will do a 2-week monitor to make sure he has no evidence of atrial fibrillation.  He will continue to medically manage his CTO.  LVH is likely related to his valve, recheck echocardiogram in 6 to 12  months.  I will wait for echocardiogram 1/26 results to help guide timeframe before ordering.  We will call patient and notify him of these changes.   Signed, Thom LITTIE Sluder, PA-C  "

## 2024-09-25 ENCOUNTER — Ambulatory Visit: Payer: Self-pay

## 2024-09-25 ENCOUNTER — Ambulatory Visit (HOSPITAL_COMMUNITY)
Admission: RE | Admit: 2024-09-25 | Discharge: 2024-09-25 | Disposition: A | Discharge status: Home / Self Care | Source: Ambulatory Visit | Attending: Internal Medicine | Admitting: Internal Medicine

## 2024-09-25 ENCOUNTER — Telehealth: Payer: Self-pay

## 2024-09-25 ENCOUNTER — Telehealth: Payer: Self-pay | Admitting: Cardiovascular Disease

## 2024-09-25 VITALS — BP 168/90 | HR 56 | Resp 18 | Ht 73.0 in | Wt 219.0 lb

## 2024-09-25 DIAGNOSIS — Z952 Presence of prosthetic heart valve: Secondary | ICD-10-CM

## 2024-09-25 DIAGNOSIS — I4891 Unspecified atrial fibrillation: Secondary | ICD-10-CM

## 2024-09-25 DIAGNOSIS — I350 Nonrheumatic aortic (valve) stenosis: Secondary | ICD-10-CM

## 2024-09-25 MED ORDER — METHOCARBAMOL 500 MG PO TABS: 500.0000 mg | ORAL_TABLET | Freq: Four times a day (QID) | ORAL | 1 refills | Status: AC | PRN

## 2024-09-25 NOTE — Telephone Encounter (Signed)
 Called the patient to let him know that Thom Sluder, PA-C said that the patient can stop his amioderone tomorrow is being and that the office will order a 2-week heart monitor to make sure it does not come back.

## 2024-09-25 NOTE — Progress Notes (Signed)
 "     [address] [place] [address]             [phone]       HPI:  Patient returns for routine postoperative follow-up having undergone mechanical AVR on 08/25/24.  He was discharged on POD 5, he is now 1 month post-op.  The patient's early postoperative recovery while in the hospital was notable for Afib which converted with amiodarone .  He saw his cardiologist yesterday and restarted amlodipine  and lisinopril -hydrochlorothiazide  this morning for high blood pressure.  He still has some numbness in the left hand in the ulnar nerve distribution but no weakness.  He is moving his bowels and taking robaxin  only for back spasms.  Allergies as of 09/25/2024   No Known Allergies      Medication List        Accurate as of September 25, 2024  9:32 AM. If you have any questions, ask your nurse or doctor.          acetaminophen  325 MG tablet Commonly known as: TYLENOL  Take 2 tablets (650 mg total) by mouth every 6 (six) hours.   amiodarone  200 MG tablet Commonly known as: PACERONE  Take 200 mg bid for 7  days;then take 200 mg daily thereafter   amLODipine  5 MG tablet Commonly known as: NORVASC  Take 1 tablet (5 mg total) by mouth daily.   amoxicillin  500 MG tablet Commonly known as: AMOXIL  TAKE 4 TABLETS 30-60 MINS PRIOR TO ANY MAJOR DENTAL WORK   aspirin  EC 81 MG tablet Take 1 tablet (81 mg total) by mouth daily. Swallow whole.   cetirizine 10 MG chewable tablet Commonly known as: ZYRTEC Chew 10 mg by mouth in the morning.   ezetimibe  10 MG tablet Commonly known as: ZETIA  TAKE 1 TABLET BY MOUTH EVERY DAY   Farxiga  5 MG Tabs tablet Generic drug: dapagliflozin  propanediol TAKE 1 TABLET BY MOUTH DAILY BEFORE BREAKFAST.   Investigational - Study Medication Take 1 tablet by mouth daily. Study name:  PURSUIT Phase IIb trial for [employer]'s [id number] Additional study details: AZD0780 30 mg or placebo  On discharge 08/30/24 medication list  indicates to resume home schedule on this medication.   lisinopril -hydrochlorothiazide  20-12.5 MG tablet Commonly known as: Zestoretic  Take 1 tablet by mouth daily.   methocarbamol  500 MG tablet Commonly known as: ROBAXIN  Take 500 mg by mouth every 6 (six) hours as needed for muscle spasms.   metoprolol  tartrate 25 MG tablet Commonly known as: LOPRESSOR  Take 0.5 tablets (12.5 mg total) by mouth 2 (two) times daily.   multivitamin with minerals tablet Take 2 tablets by mouth in the morning.   nicotine  14 mg/24hr patch Commonly known as: NICODERM CQ  - dosed in mg/24 hours Place 1 patch (14 mg total) onto the skin daily.   rosuvastatin  40 MG tablet Commonly known as: CRESTOR  TAKE 1 TABLET BY MOUTH EVERY DAY   warfarin 4 MG tablet Commonly known as: COUMADIN  Take as directed by the anticoagulation clinic. If you are unsure how to take this medication, talk to your nurse or doctor. Original instructions: Take 1/2 to 1 tablet once daily by mouth as directed by anticoagulation clinic.        BP (!) 168/90   Pulse (!) 56   Resp 18   Ht 6' 1 (1.854 m)   Wt 219 lb (99.3 kg)   SpO2 94% Comment: RA  BMI 28.89 kg/m   Physical Exam: General -  Appears well CV - Midline sternotomy incision healing well, sternum feels stable.  No murmur, + click Resp - Unlabored on RA Abd - Soft, ND/NT Ext - No swelling    Imaging: CXR - Clear lung fields. Small left pleural effusion, no pneumothorax.   Assessment/Plan: Mr. Rutigliano is a 59  year old man with history of severe aortic stenosis in the setting of bicuspid valve now s/p mechanical AVR on 12/15 with a 23 mm On-X valve.  He is recovering well - Referral made to OT for left hand numbness and pain, likely due to ulnar nerve compression during surgery - Referral made to cardiac rehab - OK to drive and gradually increase lifting amounts - Cleared to return to work at minimum 6 weeks post-op - OK to discontinue amio after 3 months  if he remains in normal sinus rhythm - Decrease INR goal to 1.5-2.5 after 3 months - Return to clinic as needed, recommended lifelong follow-up with his cardiologist  - Reminded him to take prophylactic antibiotics before any invasive procedure  Con GORMAN Clunes, MD 9:32 AM 09/25/24  "

## 2024-09-25 NOTE — Telephone Encounter (Signed)
 Patient notified.

## 2024-09-25 NOTE — Addendum Note (Signed)
 Addended byBETHA DARRYLE CURRIER on: 09/25/2024 07:50 AM   Modules accepted: Orders

## 2024-09-25 NOTE — Progress Notes (Unsigned)
 Enrolled for [employer] to mail a ZIO XT long term holter monitor to the patients address on file.   Dr. Flora Lipps to read.

## 2024-09-25 NOTE — Telephone Encounter (Signed)
Pt returning your call, please advise

## 2024-09-29 ENCOUNTER — Other Ambulatory Visit: Payer: Self-pay | Admitting: Cardiovascular Disease

## 2024-09-29 DIAGNOSIS — Z952 Presence of prosthetic heart valve: Secondary | ICD-10-CM

## 2024-09-29 DIAGNOSIS — Z7901 Long term (current) use of anticoagulants: Secondary | ICD-10-CM

## 2024-10-01 ENCOUNTER — Ambulatory Visit: Admitting: *Deleted

## 2024-10-01 DIAGNOSIS — Z7901 Long term (current) use of anticoagulants: Secondary | ICD-10-CM | POA: Diagnosis not present

## 2024-10-01 DIAGNOSIS — Z952 Presence of prosthetic heart valve: Secondary | ICD-10-CM

## 2024-10-01 LAB — POCT INR: INR: 1.5 — AB (ref 2.0–3.0)

## 2024-10-01 NOTE — Progress Notes (Signed)
 Description   INR 1.5; Today take 2 tablets (8mg ) of warfarin and tomorrow take 1.5 tablets (6mg ) then INCREASE dose to 4mg  daily (1 tablet) except for 6mg  (1.5 tablets) on Mondays on Fridays. Recheck INR in 1 week.   Coumadin  Clinic [phone] call with medication changes or bleeding issues.

## 2024-10-01 NOTE — Patient Instructions (Addendum)
 Description   INR 1.5; Today take 2 tablets (8mg ) of warfarin and tomorrow take 1.5 tablets (6mg ) then INCREASE dose to 4mg  daily (1 tablet) except for 6mg  (1.5 tablets) on Mondays on Fridays. Recheck INR in 1 week.   Coumadin  Clinic [phone] call with medication changes or bleeding issues.

## 2024-10-02 LAB — COMPREHENSIVE METABOLIC PANEL WITH GFR
ALT: 18 IU/L (ref 0–44)
AST: 19 IU/L (ref 0–40)
Albumin: 4.9 g/dL (ref 3.9–4.9)
Alkaline Phosphatase: 69 IU/L (ref 47–123)
BUN/Creatinine Ratio: 18 (ref 10–24)
BUN: 17 mg/dL (ref 8–27)
Bilirubin Total: 0.7 mg/dL (ref 0.0–1.2)
CO2: 23 mmol/L (ref 20–29)
Calcium: 9.9 mg/dL (ref 8.6–10.2)
Chloride: 99 mmol/L (ref 96–106)
Creatinine, Ser: 0.94 mg/dL (ref 0.76–1.27)
Globulin, Total: 2.5 g/dL (ref 1.5–4.5)
Glucose: 135 mg/dL — ABNORMAL HIGH (ref 70–99)
Potassium: 4.4 mmol/L (ref 3.5–5.2)
Sodium: 140 mmol/L (ref 134–144)
Total Protein: 7.4 g/dL (ref 6.0–8.5)
eGFR: 92 mL/min/1.73

## 2024-10-02 LAB — LIPID PANEL
Chol/HDL Ratio: 3.7 ratio (ref 0.0–5.0)
Cholesterol, Total: 189 mg/dL (ref 100–199)
HDL: 51 mg/dL
LDL Chol Calc (NIH): 114 mg/dL — ABNORMAL HIGH (ref 0–99)
Triglycerides: 137 mg/dL (ref 0–149)
VLDL Cholesterol Cal: 24 mg/dL (ref 5–40)

## 2024-10-02 LAB — TSH: TSH: 1.25 u[IU]/mL (ref 0.450–4.500)

## 2024-10-06 ENCOUNTER — Ambulatory Visit (HOSPITAL_COMMUNITY)

## 2024-10-06 ENCOUNTER — Ambulatory Visit: Payer: Self-pay | Admitting: Cardiology

## 2024-10-07 ENCOUNTER — Ambulatory Visit (HOSPITAL_COMMUNITY)
Admission: RE | Admit: 2024-10-07 | Discharge: 2024-10-07 | Disposition: A | Discharge status: Home / Self Care | Source: Ambulatory Visit

## 2024-10-07 DIAGNOSIS — Z952 Presence of prosthetic heart valve: Secondary | ICD-10-CM | POA: Diagnosis not present

## 2024-10-07 LAB — ECHOCARDIOGRAM COMPLETE
AR max vel: 1.83 cm2
AV Area VTI: 1.95 cm2
AV Area mean vel: 1.81 cm2
AV Mean grad: 13 mmHg
AV Peak grad: 20.3 mmHg
Ao pk vel: 2.25 m/s
Area-P 1/2: 2.51 cm2
P 1/2 time: 616 ms
S' Lateral: 3.1 cm

## 2024-10-07 NOTE — Telephone Encounter (Addendum)
 Spoke with pt regarding lab results. Pt verbalized understanding. Pt stated he has a f/u appt with [employer] next month. Pt was advised to call our office if he has any questions or concerns.

## 2024-10-08 ENCOUNTER — Other Ambulatory Visit: Payer: Self-pay

## 2024-10-08 ENCOUNTER — Ambulatory Visit: Admitting: Occupational Therapy

## 2024-10-08 ENCOUNTER — Ambulatory Visit: Payer: Self-pay | Admitting: Cardiovascular Disease

## 2024-10-08 ENCOUNTER — Ambulatory Visit: Attending: Cardiovascular Disease | Admitting: *Deleted

## 2024-10-08 DIAGNOSIS — R208 Other disturbances of skin sensation: Secondary | ICD-10-CM | POA: Insufficient documentation

## 2024-10-08 DIAGNOSIS — Z952 Presence of prosthetic heart valve: Secondary | ICD-10-CM | POA: Diagnosis not present

## 2024-10-08 DIAGNOSIS — Z7901 Long term (current) use of anticoagulants: Secondary | ICD-10-CM | POA: Diagnosis not present

## 2024-10-08 DIAGNOSIS — R278 Other lack of coordination: Secondary | ICD-10-CM | POA: Insufficient documentation

## 2024-10-08 LAB — POCT INR: INR: 2.1 (ref 2.0–3.0)

## 2024-10-08 NOTE — Patient Instructions (Signed)
" ° ° ° °   Sensation and Loss of Feeling  As a result of your condition, you may notice numbness or a loss of feeling in your ____ arm, leg, and/or side of your face.  Loss of feeling can be a serious problem. Areas on your body without feeling can be easily burned, cut, or injured while you are doing everyday activities. You need to know how to protect these areas.  You may not be able to feel certain objects, textures, movements, and/or temperatures. It has been identified that you have trouble feeling:  ? Sharp objects, such as knives, scissors, or needles  ? Hot substances, such as coffee, cigarettes, or bath water   ? Cold substances, such as ice or snow  ? Soft materials, such as cotton or animal fur  ? Someone else moving your arm or leg  ? Objects in your hand   Below are recommendations for when you go home. These things will help to protect your body parts that have a loss of feeling and will help you prevent injury to yourself.  ? When using scissors, knives, needles, etc. without help, use caution so that you do not cut yourself.  ? Keep affected arm/leg away from heat sources like the oven, kerosene heaters, and fireplaces.  ? Do not smoke. If you do smoke, make sure someone is around to help you use your lighter and to monitor that your ashes are extinguished.  ? If both sides of your body have loss of feeling, look with your eyes to make sure you are safe distance from stove or other hot/sharp objects.  ? Be careful with hot water . Test the temperature of your hot drinks, bath water , dishwashing water , etc.  ? Use a thermometer to test the temperature of your hot drinks, bath water , dishwashing water , etc.  ? Before getting into the bathtub, washing your hands, or washing dishes, check the temperature of the water  with your unaffected hand.  ? In the winter, wear gloves when outdoors for extending periods of time.  ? Wear shoes and socks when outdoors.  ? Check yourself daily for  cuts, bumps, bruises and contact your doctor if any of these concern you.  ? Always look to see where your arm/hand is. Be careful not to get it caught in the wheelchair spokes, doors/drawers, etc, or let it hang without support. Keep your arm supported in the wheelchair, on an armrest, on a pillow or when you are in bed.   The best way to protect the areas without feeling is to look to see where your body parts are and what is touching them!  "

## 2024-10-08 NOTE — Progress Notes (Signed)
 Description   INR 2.1; Continue taking warfarin 4mg  daily (1 tablet) except for 6mg  (1.5 tablets) on Mondays on Fridays. Recheck INR in 1 week.   Coumadin  Clinic [phone] call with medication changes or bleeding issues.  Amio d/c 09/25/24 see phone note-ch

## 2024-10-08 NOTE — Patient Instructions (Signed)
 Description   INR 2.1; Continue taking warfarin 4mg  daily (1 tablet) except for 6mg  (1.5 tablets) on Mondays on Fridays. Recheck INR in 1 week.   Coumadin  Clinic [phone] call with medication changes or bleeding issues.  Amio d/c 09/25/24 see phone note-ch

## 2024-10-08 NOTE — Therapy (Signed)
 " OUTPATIENT OCCUPATIONAL THERAPY NEURO EVALUATION  Patient Name: Chris Skinner MRN: [id number] DOB:[birth year], 59 y.o., male Today's Date: 10/08/2024  PCP: [name], MD REFERRING PROVIDER: Daniel Con RAMAN, MD  END OF SESSION:  OT End of Session - 10/08/24 0940     Visit Number 1    Number of Visits 9    Date for Recertification  11/21/24    Authorization Type [place] 2026    OT Start Time [phone]    OT Stop Time 1018    OT Time Calculation (min) 42 min          Past Medical History:  Diagnosis Date   Chest pain 2001   [facility], normal cardiac workup   Coronary artery disease    Diabetes mellitus without complication (HCC)    Dyslipidemia    H/O echocardiogram 09/2013   PFO found incidentally.  Echo done due to murmur   Heart murmur    mild aortic stenosis   Hyperlipidemia    Hypertension    Personal history of colonic polyp - adenoma 01/05/2014   Sleep apnea    cpap   Tobacco use disorder    Wears glasses    Past Surgical History:  Procedure Laterality Date   AORTIC VALVE REPLACEMENT N/A 08/25/2024   Procedure: REPLACEMENT, AORTIC VALVE, OPEN USING On-X PROSTHETIC HEART VALVE;  Surgeon: Daniel Con RAMAN, MD;  Location: [facility] OR;  Service: Open Heart Surgery;  Laterality: N/A;   BIOPSY  01/17/2021   Procedure: BIOPSY;  Surgeon: Aneita Gwendlyn DASEN, MD;  Location: [facility] ENDOSCOPY;  Service: Endoscopy;;   COLONOSCOPY  2015   CORONARY ANGIOGRAPHY N/A 08/05/2024   Procedure: CORONARY ANGIOGRAPHY;  Surgeon: Wonda Sharper, MD;  Location: [facility] INVASIVE CV LAB;  Service: Cardiovascular;  Laterality: N/A;   CORONARY ARTERY BYPASS GRAFT N/A 08/25/2024   Procedure: ENDOSCOPICALLY HARVESTED RIGHT GREATER SAPHENOUS VEIN;  Surgeon: Daniel Con RAMAN, MD;  Location: [facility] OR;  Service: Open Heart Surgery;  Laterality: N/A;   ESOPHAGOGASTRODUODENOSCOPY (EGD) WITH PROPOFOL  N/A 01/17/2021   Procedure: ESOPHAGOGASTRODUODENOSCOPY (EGD) WITH PROPOFOL ;  Surgeon: Aneita Gwendlyn DASEN, MD;  Location: [facility]  ENDOSCOPY;  Service: Endoscopy;  Laterality: N/A;   FOREIGN BODY REMOVAL  01/17/2021   Procedure: FOREIGN BODY REMOVAL;  Surgeon: Aneita Gwendlyn DASEN, MD;  Location: [facility] ENDOSCOPY;  Service: Endoscopy;;   INTRAOPERATIVE TRANSESOPHAGEAL ECHOCARDIOGRAM N/A 08/25/2024   Procedure: ECHOCARDIOGRAM, TRANSESOPHAGEAL, INTRAOPERATIVE;  Surgeon: Daniel Con RAMAN, MD;  Location: [facility] OR;  Service: Open Heart Surgery;  Laterality: N/A;   KNEE ARTHROSCOPY Left 1985   TONSILLECTOMY AND ADENOIDECTOMY  1974   Patient Active Problem List   Diagnosis Date Noted   Long term (current) use of anticoagulants 09/01/2024   S/P AVR (aortic valve replacement) 08/25/2024   Type 2 diabetes mellitus with hyperglycemia, without long-term current use of insulin  (HCC) 09/23/2023   History of COVID-19 05/09/2021   Smoker 04/26/2020   Erectile dysfunction 04/26/2020   Obesity (BMI 30.0-34.9) 04/26/2020   Family history of prostate cancer in father 04/26/2020   Family history of diabetes mellitus 04/26/2020   Moderate aortic stenosis 04/23/2019   Sleep apnea 04/23/2019   History of colonic polyps 01/05/2014   Dyslipidemia, goal LDL below 100 04/16/2012   Essential hypertension 04/16/2012    ONSET DATE: referral 09/25/24  REFERRING DIAG: Z95.2 (ICD-10-CM) - S/P AVR (aortic valve replacement)  THERAPY DIAG:  Other disturbances of skin sensation  Other lack of coordination  Rationale for Evaluation and Treatment: Rehabilitation  SUBJECTIVE:  SUBJECTIVE STATEMENT: Pt reports that he can feel his hand some and the rest of the time it feels like it is asleep and sensitie to touch.  Pt reports that it is worst at night time. The MD did state that it may be due to the position during surgery.   Pt accompanied by: self  PERTINENT HISTORY: underwent mechanical AVR (aortic valve replacement) on 08/25/24.  He was discharged on POD 5.  He still has some numbness in the left hand in the ulnar nerve distribution but no weakness.    PRECAUTIONS: Other: cardiac; no lifting >15#  WEIGHT BEARING RESTRICTIONS: No  PAIN:  Are you having pain? No  FALLS: Has patient fallen in last 6 months? 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  HAND DOMINANCE: Ambidextrous, write and eat left handed  ADLs: Overall ADLs: Mod I - Independent Transfers/ambulation related to ADLs: Independent  IADLs: Light housekeeping: Mod I - Independent Meal Prep: Mod I - Independent Community mobility: driving  MOBILITY STATUS: Independent  POSTURE COMMENTS:  No Significant postural limitations  ACTIVITY TOLERANCE: Activity tolerance: walking 3-5 miles per day, does have order cardiac rehab  FUNCTIONAL OUTCOME MEASURES: Quick Dash: 18.2%   UPPER EXTREMITY ROM:    Active ROM Right eval Left eval  Shoulder flexion [facility]  Shoulder abduction    Shoulder adduction    Shoulder extension    Shoulder internal rotation [facility] WFL  Shoulder external rotation [facility] WFL  Elbow flexion WFL WFL  Elbow extension [facility] WFL  Wrist flexion    Wrist extension    Wrist ulnar deviation    Wrist radial deviation    Wrist pronation    Wrist supination    (Blank rows = not tested)  UPPER EXTREMITY MMT:   did not formally assess due to cardiac precautions  MMT Right eval Left eval  Shoulder flexion    Shoulder abduction    Shoulder adduction    Shoulder extension    Shoulder internal rotation    Shoulder external  rotation    Middle trapezius    Lower trapezius    Elbow flexion    Elbow extension    Wrist flexion    Wrist extension    Wrist ulnar deviation    Wrist radial deviation    Wrist pronation    Wrist supination    (Blank rows = not tested)  HAND FUNCTION: Grip strength: Right: 46 lbs; Left: 41 lbs  COORDINATION: 9 Hole Peg test: Right: 25.12 sec; Left: 31.25 sec  SENSATION: Hot/Cold: Impaired  Pt with good protective sensation, however only recognizing stimulus 50% of time with 3.61 monofilament   COGNITION: Overall cognitive status: Within functional limits for tasks assessed  VISION: Subjective report: no changes Baseline vision: Wears glasses for distance only  TREATMENT DATE:  10/08/24 Educated on sleep positioning and decreased elbow flexion overnight.  Provided with handout. Educated on use of vision to compensate for impaired sensation HEP: initiated HEP with tendon glides and ulnar nerve glides.  Pt completing each x3 with good understanding.       PATIENT EDUCATION: Education details: Educated on role and purpose of OT as well as potential interventions and goals for therapy based on initial evaluation findings. Person educated: Patient Education method: Explanation, Demonstration, Verbal cues, and Handouts Education comprehension: verbalized understanding and needs further education  HOME EXERCISE PROGRAM: Access Code: [email or web address] URL: [email or web address] Date: 10/08/2024 Prepared by: [facility] - Outpatient  Rehab - [facility]  Exercises - Seated Digit Tendon Gliding  - 3 x daily - 10-15 reps - Standing Ulnar Nerve Glide  - 3 x daily - 10-15 reps   GOALS: Goals reviewed with patient? Yes  SHORT TERM GOALS: Target date: 10/31/24  Pt will be independent in coordination and ROM HEP. Baseline: new to OPOT Goal  status: INITIAL  2.  Pt will verbalize understanding of compensatory strategies and re-sensitization strategies for decreased sensation in L hand. Baseline: decreased and hypersensitivity in L hand Goal status: INITIAL  3.  Pt will verbalize understanding of sleep positioning. Baseline: worse sensation overnight Goal status: INITIAL  LONG TERM GOALS: Target date: 11/21/24  Pt will demonstrate improved fine motor coordination for ADLs as evidenced by decreasing 9 hole peg test score for LUE by 4 secs Baseline: R: 25 sec and L: 31 sec Goal status: INITIAL  2.  Pt will report improvements in functional ability by decreased impairment per Quick DASH assessment to 10% impairment or better, for better quality of life.  Baseline: 18.2% Goal status: INITIAL  3.  Pt will verbalize decreased pain and tingling to min impairment or less. Baseline: mod impairment on QuickDASH Goal status: INITIAL   ASSESSMENT:  CLINICAL IMPRESSION: Patient is a 59 y.o. male who was seen today for occupational therapy evaluation for left hand numbness in ulnar nerve distribution after surgical aortic valve replacement.  Pt reports that it is worse at night time.  Pt demonstrating decreased sensation and hypersensitivity to heat.  Pt demonstrating mild coordination impairments. Pt receptive to education on sleep positioning and compensatory strategies for impaired sensation. Patient currently presents below baseline level of functioning demonstrating functional deficits and impairments as noted below. Pt would benefit from skilled OT services in the outpatient setting to work on impairments as noted below to help pt return to PLOF as able.      PERFORMANCE DEFICITS: in functional skills including IADLs, coordination, sensation, pain, Fine motor control, body mechanics, decreased knowledge of precautions, skin integrity, and UE functional use and psychosocial skills including coping strategies and habits.    IMPAIRMENTS: are limiting patient from IADLs, rest and sleep, and leisure.   CO-MORBIDITIES: may have co-morbidities  that affects occupational performance. Patient will benefit from skilled OT to address above impairments and improve overall function.  MODIFICATION OR ASSISTANCE TO COMPLETE EVALUATION: No modification of tasks or assist necessary to complete an evaluation.  OT OCCUPATIONAL PROFILE AND HISTORY: Problem focused assessment: Including review of records relating to presenting problem.  CLINICAL DECISION MAKING: LOW - limited treatment options, no task modification necessary  REHAB POTENTIAL: Good  EVALUATION COMPLEXITY: Low    PLAN:  OT FREQUENCY: 1-2x/week  OT DURATION: 6 weeks  PLANNED INTERVENTIONS: 97168 OT Re-evaluation, 97535 self care/ADL training, 02889 therapeutic exercise, 97530 therapeutic activity, 97112 neuromuscular re-education,  97140 manual therapy, 97035 ultrasound, 02967 electrical stimulation (manual), [id number] Orthotic Initial, [id number] Orthotic/Prosthetic subsequent, functional mobility training, coping strategies training, patient/family education, and DME and/or AE instructions  RECOMMENDED OTHER SERVICES: NA  CONSULTED AND AGREED WITH PLAN OF CARE: Patient  PLAN FOR NEXT SESSION: review and add to nerve glides, educate on re-sensitization strategies, review sleep position   Sumner, LAURAINE, OTR/L 10/08/2024, 10:45 AM   [facility] at [facility] [address] [place], [place] [address] Phone # [phone] Fax # [phone]         "

## 2024-10-12 ENCOUNTER — Other Ambulatory Visit: Payer: Self-pay | Admitting: Physician Assistant

## 2024-10-15 ENCOUNTER — Ambulatory Visit: Admitting: *Deleted

## 2024-10-15 ENCOUNTER — Ambulatory Visit: Admitting: Occupational Therapy

## 2024-10-15 DIAGNOSIS — R208 Other disturbances of skin sensation: Secondary | ICD-10-CM

## 2024-10-15 DIAGNOSIS — R278 Other lack of coordination: Secondary | ICD-10-CM

## 2024-10-15 DIAGNOSIS — Z7901 Long term (current) use of anticoagulants: Secondary | ICD-10-CM

## 2024-10-15 DIAGNOSIS — Z952 Presence of prosthetic heart valve: Secondary | ICD-10-CM | POA: Diagnosis not present

## 2024-10-15 LAB — POCT INR: INR: 1.9 — AB (ref 2.0–3.0)

## 2024-10-15 NOTE — Progress Notes (Addendum)
 Description   INR-1.9; Take 2 tablets of warfarin then START taking warfarin 4mg  daily (1 tablet) except for 6mg  (1.5 tablets) on Mondays, Wednesdays, and Fridays. Recheck INR in 2 weeks.   Coumadin  Clinic [phone] call with medication changes or bleeding issues.  Amio d/c 09/25/24 see phone note-ch

## 2024-10-15 NOTE — Therapy (Signed)
 " OUTPATIENT OCCUPATIONAL THERAPY NEURO  Treatment Note  Patient Name: Chris Skinner MRN: [id number] DOB:1965/10/04, 59 y.o., male Today's Date: 10/15/2024  PCP: [name], MD REFERRING PROVIDER: Daniel Con RAMAN, MD  END OF SESSION:  OT End of Session - 10/15/24 1140     Visit Number 2    Number of Visits 9    Date for Recertification  11/21/24    Authorization Type [employer] [place] 2026    OT Start Time 1020    OT Stop Time 1100    OT Time Calculation (min) 40 min           Past Medical History:  Diagnosis Date   Chest pain 2001   [facility], normal cardiac workup   Coronary artery disease    Diabetes mellitus without complication (HCC)    Dyslipidemia    H/O echocardiogram 09/2013   PFO found incidentally.  Echo done due to murmur   Heart murmur    mild aortic stenosis   Hyperlipidemia    Hypertension    Personal history of colonic polyp - adenoma 01/05/2014   Sleep apnea    cpap   Tobacco use disorder    Wears glasses    Past Surgical History:  Procedure Laterality Date   AORTIC VALVE REPLACEMENT N/A 08/25/2024   Procedure: REPLACEMENT, AORTIC VALVE, OPEN USING On-X PROSTHETIC HEART VALVE;  Surgeon: Daniel Con RAMAN, MD;  Location: [facility] OR;  Service: Open Heart Surgery;  Laterality: N/A;   BIOPSY  01/17/2021   Procedure: BIOPSY;  Surgeon: Aneita Gwendlyn DASEN, MD;  Location: [facility] ENDOSCOPY;  Service: Endoscopy;;   COLONOSCOPY  2015   CORONARY ANGIOGRAPHY N/A 08/05/2024   Procedure: CORONARY ANGIOGRAPHY;  Surgeon: Wonda Sharper, MD;  Location: [facility] INVASIVE CV LAB;  Service: Cardiovascular;  Laterality: N/A;   CORONARY ARTERY BYPASS GRAFT N/A 08/25/2024   Procedure: ENDOSCOPICALLY HARVESTED RIGHT GREATER SAPHENOUS VEIN;  Surgeon: Daniel Con RAMAN, MD;  Location: [facility] OR;  Service: Open Heart Surgery;  Laterality: N/A;   ESOPHAGOGASTRODUODENOSCOPY (EGD) WITH PROPOFOL  N/A 01/17/2021   Procedure: ESOPHAGOGASTRODUODENOSCOPY (EGD) WITH PROPOFOL ;  Surgeon: Aneita Gwendlyn DASEN, MD;   Location: [facility] ENDOSCOPY;  Service: Endoscopy;  Laterality: N/A;   FOREIGN BODY REMOVAL  01/17/2021   Procedure: FOREIGN BODY REMOVAL;  Surgeon: Aneita Gwendlyn DASEN, MD;  Location: [facility] ENDOSCOPY;  Service: Endoscopy;;   INTRAOPERATIVE TRANSESOPHAGEAL ECHOCARDIOGRAM N/A 08/25/2024   Procedure: ECHOCARDIOGRAM, TRANSESOPHAGEAL, INTRAOPERATIVE;  Surgeon: Daniel Con RAMAN, MD;  Location: [facility] OR;  Service: Open Heart Surgery;  Laterality: N/A;   KNEE ARTHROSCOPY Left 1985   TONSILLECTOMY AND ADENOIDECTOMY  1974   Patient Active Problem List   Diagnosis Date Noted   Long term (current) use of anticoagulants 09/01/2024   S/P AVR (aortic valve replacement) 08/25/2024   Type 2 diabetes mellitus with hyperglycemia, without long-term current use of insulin  (HCC) 09/23/2023   History of COVID-19 05/09/2021   Smoker 04/26/2020   Erectile dysfunction 04/26/2020   Obesity (BMI 30.0-34.9) 04/26/2020   Family history of prostate cancer in father 04/26/2020   Family history of diabetes mellitus 04/26/2020   Moderate aortic stenosis 04/23/2019   Sleep apnea 04/23/2019   History of colonic polyps 01/05/2014   Dyslipidemia, goal LDL below 100 04/16/2012   Essential hypertension 04/16/2012    ONSET DATE: referral 09/25/24  REFERRING DIAG: Z95.2 (ICD-10-CM) - S/P AVR (aortic valve replacement)  THERAPY DIAG:  Other disturbances of skin sensation  Other lack of coordination  Rationale for Evaluation and Treatment: Rehabilitation  SUBJECTIVE:   SUBJECTIVE STATEMENT: Pt reports that he seems to have random pain in the fingers, but none right now.  Pt accompanied by: self  PERTINENT HISTORY: underwent mechanical AVR (aortic valve replacement) on 08/25/24.  He was discharged on POD 5.  He still has some numbness in the left hand in the ulnar nerve distribution but no weakness.   PRECAUTIONS: Other: cardiac; no lifting >15#  WEIGHT BEARING RESTRICTIONS: No  PAIN:  Are you having pain? No  FALLS: Has patient  fallen in last 6 months? 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  HAND DOMINANCE: Ambidextrous, write and eat left handed  ADLs: Overall ADLs: Mod I - Independent Transfers/ambulation related to ADLs: Independent  IADLs: Light housekeeping: Mod I - Independent Meal Prep: Mod I - Independent Community mobility: driving  MOBILITY STATUS: Independent  POSTURE COMMENTS:  No Significant postural limitations  ACTIVITY TOLERANCE: Activity tolerance: walking 3-5 miles per day, does have order cardiac rehab  FUNCTIONAL OUTCOME MEASURES: Quick Dash: 18.2%   UPPER EXTREMITY ROM:    Active ROM Right eval Left eval  Shoulder flexion [facility]  Shoulder abduction    Shoulder adduction    Shoulder extension    Shoulder internal rotation [facility] WFL  Shoulder external rotation [facility] WFL  Elbow flexion WFL WFL  Elbow extension [facility] WFL  Wrist flexion    Wrist extension    Wrist ulnar deviation    Wrist radial deviation    Wrist pronation    Wrist supination    (Blank rows = not tested)  UPPER EXTREMITY MMT:   did not formally assess due to cardiac precautions  MMT Right eval Left eval  Shoulder flexion    Shoulder abduction    Shoulder adduction    Shoulder extension    Shoulder internal rotation    Shoulder external rotation    Middle trapezius    Lower trapezius    Elbow flexion    Elbow extension    Wrist flexion    Wrist extension    Wrist ulnar  deviation    Wrist radial deviation    Wrist pronation    Wrist supination    (Blank rows = not tested)  HAND FUNCTION: Grip strength: Right: 46 lbs; Left: 41 lbs  COORDINATION: 9 Hole Peg test: Right: 25.12 sec; Left: 31.25 sec  SENSATION: Hot/Cold: Impaired  Pt with good protective sensation, however only recognizing stimulus 50% of time with 3.61 monofilament   COGNITION: Overall cognitive status: Within functional limits for tasks assessed  VISION: Subjective report: no changes Baseline vision: Wears glasses for distance only  TREATMENT DATE:  10/15/24 Nerve glides: reviewed previous exercises and added 2 additional exercises.  Completed full arm ulnar nerve glide and ulnar nerve flossing in prayer stretch position.  Completed each x5 and then x8 with good technique and demonstration from therapist. Sleeping: reiterated sleep position with RUE in extended position either along side of propped on pillow to decrease compression at ulnar nerve.  Pt reports having some good nights of sleep, but was experiencing more pain and discomfort last night. Desensitization strategies/techniques: OT instructed pt in desensitization strategies as pt still decreased sensation in fingers, especially small and ring fingers on R hand.  OT educated pt on completing rubbing of R hand and finger tips with soft items progressing to courser items.  OT demonstrating with wash cloth.  Pt completing with opposite hand in circular and linear directions.  OT then educating pt on progressive textures to attempt as tolerance increases.  Pt then removing stone from bowl of dried beans while OT educating placing variety of items into bag or bowl or rice or beans and rely on sensation to identify particular items.    Reiterated education on Safety considerations for loss of sensation as noted in  patient instructions to reduce risk of injury to affected hand.  Pt encouraged to be careful of sharp, hot/cold (check temperatures of water ), breakable, heavy objects and chemicals. Patient verbalized understanding. Handouts provided.      10/08/24 Educated on sleep positioning and decreased elbow flexion overnight.  Provided with handout. Educated on use of vision to compensate for impaired sensation HEP: initiated HEP with tendon glides and ulnar nerve glides.  Pt completing each x3 with good understanding.       PATIENT EDUCATION: Education details: resensitization, ulnar nerve glides Person educated: Patient Education method: Explanation, Demonstration, Verbal cues, and Handouts Education comprehension: verbalized understanding and needs further education  HOME EXERCISE PROGRAM: Access Code: [email or web address] URL: [email or web address] Date: 10/15/2024 Prepared by: [facility] - Outpatient  Rehab - [facility]  Exercises - Seated Digit Tendon Gliding  - 3 x daily - 10-15 reps - Standing Ulnar Nerve Glide  - 3 x daily - 10-15 reps - Ulnar Nerve Flossing  - 3 x daily - 10-15 reps - Ulnar Nerve Glide- Full Arm  - 3 x daily - 10-15 reps   GOALS: Goals reviewed with patient? Yes  SHORT TERM GOALS: Target date: 10/31/24  Pt will be independent in coordination and ROM HEP. Baseline: new to OPOT Goal status: in progress  2.  Pt will verbalize understanding of compensatory strategies and re-sensitization strategies for decreased sensation in L hand. Baseline: decreased and hypersensitivity in L hand Goal status:  in progress  3.  Pt will verbalize understanding of sleep positioning. Baseline: worse sensation overnight Goal status:  in progress  LONG TERM GOALS: Target date: 11/21/24  Pt will demonstrate improved fine motor coordination for ADLs as evidenced by decreasing 9 hole peg test score for LUE by 4 secs Baseline: R: 25 sec and L: 31 sec Goal status:  in  progress  2.  Pt will report improvements in functional ability by decreased impairment per Quick DASH assessment to 10% impairment or better, for better quality of life.  Baseline: 18.2% Goal status:  in progress  3.  Pt will verbalize decreased pain and tingling to min impairment or less. Baseline: mod impairment on QuickDASH Goal status:  in progress   ASSESSMENT:  CLINICAL IMPRESSION: Patient is a 59 y.o. male who was seen today for occupational  therapy treatment for left hand numbness in ulnar nerve distribution after surgical aortic valve replacement.  Pt reports that it continues to be worse at night time.  Pt demonstrating good tolerance to ulnar nerve glides and flossing and good manipulation of small items when removing from dried beans.  Pt reports understanding of sleep positioning, however still having difficulty with sleep due to pain.  Pt will continue to benefit from skilled OT services in the outpatient setting to work on impairments as noted below to help pt return to PLOF as able.      PERFORMANCE DEFICITS: in functional skills including IADLs, coordination, sensation, pain, Fine motor control, body mechanics, decreased knowledge of precautions, skin integrity, and UE functional use and psychosocial skills including coping strategies and habits.     PLAN:  OT FREQUENCY: 1-2x/week  OT DURATION: 6 weeks  PLANNED INTERVENTIONS: 97168 OT Re-evaluation, 97535 self care/ADL training, 02889 therapeutic exercise, 97530 therapeutic activity, 97112 neuromuscular re-education, 97140 manual therapy, 97035 ultrasound, 97032 electrical stimulation (manual), 97760 Orthotic Initial, [id number] Orthotic/Prosthetic subsequent, functional mobility training, coping strategies training, patient/family education, and DME and/or AE instructions  RECOMMENDED OTHER SERVICES: NA  CONSULTED AND AGREED WITH PLAN OF CARE: Patient  PLAN FOR NEXT SESSION: review and add to nerve glides, complete  coordination tasks with in-hand manipulation and/or removing items from mixed container, educate on coordination with 2 golf balls, review sleep position   Treshun Wold, OTR/L 10/15/2024, 11:41 AM   [facility] at [facility] [address], [address] [place], [place] [address] Phone # [phone] Fax # [phone]         "

## 2024-10-15 NOTE — Patient Instructions (Addendum)
 Description   INR-1.9; Take 2 tablets of warfarin then START taking warfarin 4mg  daily (1 tablet) except for 6mg  (1.5 tablets) on Mondays, Wednesdays, and Fridays. Recheck INR in 2 weeks.   Coumadin  Clinic [phone] call with medication changes or bleeding issues.  Amio d/c 09/25/24 see phone note-ch

## 2024-10-22 ENCOUNTER — Ambulatory Visit

## 2024-10-29 ENCOUNTER — Ambulatory Visit: Admitting: Cardiology

## 2024-10-29 ENCOUNTER — Ambulatory Visit

## 2024-11-05 ENCOUNTER — Ambulatory Visit

## 2025-09-29 ENCOUNTER — Encounter: Payer: Self-pay | Admitting: Family Medicine
